# Patient Record
Sex: Female | Born: 1955 | Race: Black or African American | Hispanic: No | Marital: Single | Smoking: Never smoker
Health system: Southern US, Community
[De-identification: ages and names within clinical notes are randomized; demographics above are authoritative.]

## PROBLEM LIST (undated history)

## (undated) DIAGNOSIS — M25562 Pain in left knee: Secondary | ICD-10-CM

## (undated) DIAGNOSIS — E119 Type 2 diabetes mellitus without complications: Secondary | ICD-10-CM

## (undated) DIAGNOSIS — I10 Essential (primary) hypertension: Secondary | ICD-10-CM

## (undated) HISTORY — DX: Type 2 diabetes mellitus without complications: E11.9

## (undated) HISTORY — PX: ABDOMINAL HYSTERECTOMY: SHX81

## (undated) HISTORY — DX: Morbid (severe) obesity due to excess calories: E66.01

## (undated) HISTORY — PX: ACHILLES TENDON SURGERY: SHX542

## (undated) HISTORY — DX: Pain in left knee: M25.562

---

## 2003-11-25 ENCOUNTER — Other Ambulatory Visit: Payer: Self-pay

## 2004-10-15 ENCOUNTER — Ambulatory Visit: Payer: Self-pay | Admitting: Internal Medicine

## 2006-03-21 ENCOUNTER — Ambulatory Visit: Payer: Self-pay | Admitting: Family Medicine

## 2007-01-06 ENCOUNTER — Ambulatory Visit: Payer: Self-pay | Admitting: Gastroenterology

## 2007-07-16 ENCOUNTER — Emergency Department: Payer: Self-pay | Admitting: Emergency Medicine

## 2008-03-21 ENCOUNTER — Ambulatory Visit: Payer: Self-pay

## 2009-11-14 ENCOUNTER — Emergency Department: Payer: Self-pay | Admitting: Emergency Medicine

## 2011-03-03 ENCOUNTER — Emergency Department: Payer: Self-pay | Admitting: Emergency Medicine

## 2011-09-26 ENCOUNTER — Ambulatory Visit: Payer: Self-pay

## 2011-10-20 ENCOUNTER — Emergency Department: Payer: Self-pay | Admitting: Unknown Physician Specialty

## 2011-11-12 ENCOUNTER — Ambulatory Visit: Payer: Self-pay | Admitting: Sports Medicine

## 2011-11-13 ENCOUNTER — Ambulatory Visit: Payer: Self-pay | Admitting: Sports Medicine

## 2012-02-25 ENCOUNTER — Ambulatory Visit: Payer: Self-pay

## 2013-06-03 ENCOUNTER — Emergency Department: Payer: Self-pay | Admitting: Emergency Medicine

## 2013-06-03 LAB — COMPREHENSIVE METABOLIC PANEL
ALBUMIN: 3.5 g/dL (ref 3.4–5.0)
ALK PHOS: 78 U/L
ALT: 25 U/L (ref 12–78)
Anion Gap: 6 — ABNORMAL LOW (ref 7–16)
BILIRUBIN TOTAL: 0.3 mg/dL (ref 0.2–1.0)
BUN: 14 mg/dL (ref 7–18)
CHLORIDE: 101 mmol/L (ref 98–107)
CREATININE: 0.77 mg/dL (ref 0.60–1.30)
Calcium, Total: 9.4 mg/dL (ref 8.5–10.1)
Co2: 30 mmol/L (ref 21–32)
EGFR (African American): >60
Glucose: 103 mg/dL — ABNORMAL HIGH (ref 65–99)
OSMOLALITY: 275 (ref 275–301)
POTASSIUM: 3.1 mmol/L — AB (ref 3.5–5.1)
SGOT(AST): 21 U/L (ref 15–37)
Sodium: 137 mmol/L (ref 136–145)
Total Protein: 8.2 g/dL (ref 6.4–8.2)

## 2013-06-03 LAB — CBC
HCT: 35.3 % (ref 35.0–47.0)
HGB: 11.8 g/dL — AB (ref 12.0–16.0)
MCH: 27.1 pg (ref 26.0–34.0)
MCHC: 33.5 g/dL (ref 32.0–36.0)
MCV: 81 fL (ref 80–100)
PLATELETS: 435 10*3/uL (ref 150–440)
RBC: 4.36 10*6/uL (ref 3.80–5.20)
RDW: 13.9 % (ref 11.5–14.5)
WBC: 7.9 10*3/uL (ref 3.6–11.0)

## 2013-06-03 LAB — TROPONIN I

## 2014-03-07 ENCOUNTER — Ambulatory Visit: Payer: Self-pay

## 2014-04-27 ENCOUNTER — Ambulatory Visit: Payer: Self-pay | Admitting: Nurse Practitioner

## 2014-05-17 DIAGNOSIS — M25562 Pain in left knee: Secondary | ICD-10-CM

## 2014-05-17 HISTORY — DX: Pain in left knee: M25.562

## 2014-07-21 ENCOUNTER — Emergency Department
Admit: 2014-07-21 | Disposition: A | Discharge status: Home / Self Care | Payer: Self-pay | Admitting: Emergency Medicine

## 2014-08-02 LAB — CBC AND DIFFERENTIAL
NEUTROS ABS: 3 /uL
WBC: 6.1 10^3/mL

## 2014-08-02 LAB — TSH: TSH: 0.9 u[IU]/mL (ref 0.41–5.90)

## 2014-11-15 ENCOUNTER — Other Ambulatory Visit: Payer: Self-pay

## 2014-11-21 ENCOUNTER — Inpatient Hospital Stay
Admission: EM | Admit: 2014-11-21 | Discharge: 2014-11-24 | DRG: 354 | Disposition: A | Discharge status: Home / Self Care | Payer: Self-pay | Attending: Surgery | Admitting: Surgery

## 2014-11-21 ENCOUNTER — Emergency Department: Payer: Self-pay

## 2014-11-21 ENCOUNTER — Encounter: Payer: Self-pay | Admitting: *Deleted

## 2014-11-21 DIAGNOSIS — R1012 Left upper quadrant pain: Secondary | ICD-10-CM

## 2014-11-21 DIAGNOSIS — E669 Obesity, unspecified: Secondary | ICD-10-CM | POA: Diagnosis present

## 2014-11-21 DIAGNOSIS — K439 Ventral hernia without obstruction or gangrene: Secondary | ICD-10-CM

## 2014-11-21 DIAGNOSIS — K432 Incisional hernia without obstruction or gangrene: Secondary | ICD-10-CM | POA: Diagnosis present

## 2014-11-21 DIAGNOSIS — Z6841 Body Mass Index (BMI) 40.0 and over, adult: Secondary | ICD-10-CM

## 2014-11-21 DIAGNOSIS — I10 Essential (primary) hypertension: Secondary | ICD-10-CM | POA: Diagnosis present

## 2014-11-21 DIAGNOSIS — K430 Incisional hernia with obstruction, without gangrene: Principal | ICD-10-CM | POA: Diagnosis present

## 2014-11-21 HISTORY — DX: Essential (primary) hypertension: I10

## 2014-11-21 HISTORY — PX: HERNIA REPAIR: SHX51

## 2014-11-21 LAB — CBC
HEMATOCRIT: 38.7 % (ref 35.0–47.0)
Hemoglobin: 12.6 g/dL (ref 12.0–16.0)
MCH: 26.5 pg (ref 26.0–34.0)
MCHC: 32.7 g/dL (ref 32.0–36.0)
MCV: 81.1 fL (ref 80.0–100.0)
Platelets: 456 10*3/uL — ABNORMAL HIGH (ref 150–440)
RBC: 4.77 MIL/uL (ref 3.80–5.20)
RDW: 15 % — AB (ref 11.5–14.5)
WBC: 12.1 10*3/uL — AB (ref 3.6–11.0)

## 2014-11-21 LAB — COMPREHENSIVE METABOLIC PANEL
ALBUMIN: 4.4 g/dL (ref 3.5–5.0)
ALT: 18 U/L (ref 14–54)
AST: 27 U/L (ref 15–41)
Alkaline Phosphatase: 68 U/L (ref 38–126)
Anion gap: 13 (ref 5–15)
BUN: 15 mg/dL (ref 6–20)
CHLORIDE: 96 mmol/L — AB (ref 101–111)
CO2: 24 mmol/L (ref 22–32)
CREATININE: 0.86 mg/dL (ref 0.44–1.00)
Calcium: 9.6 mg/dL (ref 8.9–10.3)
GFR calc Af Amer: >60 mL/min (ref >60)
Glucose, Bld: 123 mg/dL — ABNORMAL HIGH (ref 65–99)
Potassium: 3.1 mmol/L — ABNORMAL LOW (ref 3.5–5.1)
Sodium: 133 mmol/L — ABNORMAL LOW (ref 135–145)
TOTAL PROTEIN: 8.6 g/dL — AB (ref 6.5–8.1)
Total Bilirubin: 0.1 mg/dL — ABNORMAL LOW (ref 0.3–1.2)

## 2014-11-21 LAB — URINALYSIS COMPLETE WITH MICROSCOPIC (ARMC ONLY)
BACTERIA UA: NONE SEEN
Bilirubin Urine: NEGATIVE
Glucose, UA: NEGATIVE mg/dL
HGB URINE DIPSTICK: NEGATIVE
Ketones, ur: NEGATIVE mg/dL
NITRITE: NEGATIVE
PH: 7 (ref 5.0–8.0)
Protein, ur: NEGATIVE mg/dL
RBC / HPF: NONE SEEN RBC/hpf (ref 0–5)
Specific Gravity, Urine: 1.013 (ref 1.005–1.030)

## 2014-11-21 MED ORDER — IOHEXOL 300 MG/ML  SOLN
100.0000 mL | Freq: Once | INTRAMUSCULAR | Status: AC | PRN
  Filled 2014-11-21: qty 100

## 2014-11-21 MED ORDER — METFORMIN HCL 500 MG PO TABS: 500.0000 mg | ORAL_TABLET | Freq: Two times a day (BID) | ORAL | Status: DC

## 2014-11-21 MED ORDER — ONDANSETRON HCL 4 MG PO TABS: 4.0000 mg | ORAL_TABLET | Freq: Four times a day (QID) | ORAL | Status: DC | PRN

## 2014-11-21 MED ORDER — AMLODIPINE BESYLATE 5 MG PO TABS
10.0000 mg | ORAL_TABLET | Freq: Every day | ORAL | Status: DC
  Administered 2014-11-22 – 2014-11-24 (×3): 10 mg via ORAL
  Filled 2014-11-21 (×3): qty 2

## 2014-11-21 MED ORDER — HEPARIN SODIUM (PORCINE) 5000 UNIT/ML IJ SOLN
5000.0000 [IU] | Freq: Three times a day (TID) | INTRAMUSCULAR | Status: DC
  Administered 2014-11-21 – 2014-11-24 (×8): 5000 [IU] via SUBCUTANEOUS
  Filled 2014-11-21 (×8): qty 1

## 2014-11-21 MED ORDER — ASPIRIN EC 325 MG PO TBEC
325.0000 mg | DELAYED_RELEASE_TABLET | Freq: Every day | ORAL | Status: DC
Start: 2014-11-22 — End: 2014-11-24
  Administered 2014-11-22 – 2014-11-24 (×3): 325 mg via ORAL
  Filled 2014-11-21 (×3): qty 1

## 2014-11-21 MED ORDER — ONDANSETRON HCL 4 MG/2ML IJ SOLN
4.0000 mg | Freq: Four times a day (QID) | INTRAMUSCULAR | Status: DC | PRN
  Administered 2014-11-23 (×2): 4 mg via INTRAVENOUS
  Filled 2014-11-21 (×2): qty 2

## 2014-11-21 MED ORDER — DEXTROSE-NACL 5-0.9 % IV SOLN
INTRAVENOUS | Status: DC
  Administered 2014-11-21 – 2014-11-22 (×2): via INTRAVENOUS
  Administered 2014-11-22 (×2): 1 mL via INTRAVENOUS
  Administered 2014-11-23 – 2014-11-24 (×4): via INTRAVENOUS

## 2014-11-21 MED ORDER — SODIUM CHLORIDE 0.9 % IV SOLN
Freq: Once | INTRAVENOUS | Status: AC
  Administered 2014-11-21: 1000 mL via INTRAVENOUS

## 2014-11-21 MED ORDER — MORPHINE SULFATE 4 MG/ML IJ SOLN
4.0000 mg | Freq: Once | INTRAMUSCULAR | Status: AC
  Administered 2014-11-21: 4 mg via INTRAVENOUS
  Filled 2014-11-21: qty 1

## 2014-11-21 MED ORDER — LISINOPRIL 10 MG PO TABS
10.0000 mg | ORAL_TABLET | Freq: Every day | ORAL | Status: DC
  Administered 2014-11-22 – 2014-11-24 (×3): 10 mg via ORAL
  Filled 2014-11-21 (×3): qty 1

## 2014-11-21 MED ORDER — IOHEXOL 240 MG/ML SOLN
25.0000 mL | Freq: Once | INTRAMUSCULAR | Status: AC | PRN
  Administered 2014-11-21: 25 mL via ORAL
  Filled 2014-11-21: qty 25

## 2014-11-21 MED ORDER — HEPARIN SODIUM (PORCINE) 5000 UNIT/ML IJ SOLN
INTRAMUSCULAR | Status: AC
  Administered 2014-11-21: 5000 [IU] via SUBCUTANEOUS
  Filled 2014-11-21: qty 1

## 2014-11-21 MED ORDER — ONDANSETRON HCL 4 MG/2ML IJ SOLN
4.0000 mg | Freq: Once | INTRAMUSCULAR | Status: AC
  Administered 2014-11-21: 4 mg via INTRAVENOUS
  Filled 2014-11-21: qty 2

## 2014-11-21 MED ORDER — METFORMIN HCL 500 MG PO TABS
ORAL_TABLET | ORAL | Status: AC
  Administered 2014-11-21: 500 mg via ORAL
  Filled 2014-11-21: qty 1

## 2014-11-21 MED ORDER — METFORMIN HCL 500 MG PO TABS
500.0000 mg | ORAL_TABLET | Freq: Two times a day (BID) | ORAL | Status: DC
  Administered 2014-11-21: 500 mg via ORAL

## 2014-11-21 MED ORDER — CLONIDINE HCL 0.1 MG PO TABS
0.3000 mg | ORAL_TABLET | Freq: Two times a day (BID) | ORAL | Status: DC
  Administered 2014-11-21 – 2014-11-24 (×6): 0.3 mg via ORAL
  Filled 2014-11-21 (×5): qty 3

## 2014-11-21 MED ORDER — CLONIDINE HCL 0.1 MG PO TABS
ORAL_TABLET | ORAL | Status: AC
  Administered 2014-11-21: 0.3 mg via ORAL
  Filled 2014-11-21: qty 3

## 2014-11-21 MED ORDER — MORPHINE SULFATE 2 MG/ML IJ SOLN
2.0000 mg | INTRAMUSCULAR | Status: DC | PRN
  Administered 2014-11-22 – 2014-11-23 (×5): 2 mg via INTRAVENOUS
  Filled 2014-11-21 (×5): qty 1

## 2014-11-21 NOTE — ED Notes (Signed)
Pt reports left lower abdominal pain that started yesterday. Denies n/v/d, fevers or urinary symptoms.

## 2014-11-21 NOTE — ED Notes (Signed)
Pt resting in bed at this time. No acute distress noted. Lights dimmed and tv on.

## 2014-11-21 NOTE — H&P (Signed)
Jillian Pierce is an 59 y.o. female.    Chief Complaint: Abdominal pain  HPI: This patient with abdominal pain that started Saturday morning it was worse yesterday but is better today she's had some nausea but no emesis and is passing gas. She's never had an episode like this before. She denies weight loss fevers or chills.  Past Medical History  Diagnosis Date  . Hypertension     History reviewed. No pertinent past surgical history.  No family history on file. Social History:  reports that she has never smoked. She does not have any smokeless tobacco history on file. She reports that she does not drink alcohol or use illicit drugs.  Allergies: No Known Allergies   (Not in a hospital admission)   Review of Systems  Constitutional: Negative for fever and chills.  HENT: Negative.   Eyes: Negative.   Respiratory: Negative for cough, hemoptysis and shortness of breath.   Cardiovascular: Negative for chest pain, palpitations and leg swelling.  Gastrointestinal: Positive for nausea and abdominal pain. Negative for heartburn, vomiting, diarrhea, constipation, blood in stool and melena.  Genitourinary: Negative.   Musculoskeletal: Negative.   Skin: Negative.   Neurological: Negative.   Endo/Heme/Allergies: Negative.   Psychiatric/Behavioral: Negative.      Physical Exam:  BP 151/98 mmHg  Pulse 75  Temp(Src) 97.4 F (36.3 C)  Resp 18  Ht 5' 2" (1.575 m)  Wt 250 lb (113.399 kg)  BMI 45.71 kg/m2  SpO2 96%  Physical Exam  Constitutional: She is oriented to person, place, and time and well-developed, well-nourished, and in no distress.  Obese There is uncomfortable.  HENT:  Head: Normocephalic and atraumatic.  Eyes: No scleral icterus.  Neck: Normal range of motion. Neck supple.  Cardiovascular: Normal rate, regular rhythm and normal heart sounds.   Pulmonary/Chest: Effort normal and breath sounds normal. No respiratory distress. She has no wheezes. She has no rales.   Abdominal: Soft. She exhibits mass. She exhibits no distension. There is tenderness. There is no rebound and no guarding.  Partially reducible but incarcerated ventral hernia cephalad to the umbilicus and to the left of midline slightly. She also has long midline scar from the epigastrium to the umbilicus and below. No other hernias noted. Hernia itself is partially reducible and tender. No peritoneal signs.  Musculoskeletal: Normal range of motion.  Lymphadenopathy:    She has no cervical adenopathy.  Neurological: She is alert and oriented to person, place, and time.  Skin: Skin is warm and dry.  Psychiatric: Mood, affect and judgment normal.        Results for orders placed or performed during the hospital encounter of 11/21/14 (from the past 48 hour(s))  Comprehensive metabolic panel     Status: Abnormal   Collection Time: 11/21/14  6:22 PM  Result Value Ref Range   Sodium 133 (L) 135 - 145 mmol/L   Potassium 3.1 (L) 3.5 - 5.1 mmol/L   Chloride 96 (L) 101 - 111 mmol/L   CO2 24 22 - 32 mmol/L   Glucose, Bld 123 (H) 65 - 99 mg/dL   BUN 15 6 - 20 mg/dL   Creatinine, Ser 0.86 0.44 - 1.00 mg/dL   Calcium 9.6 8.9 - 10.3 mg/dL   Total Protein 8.6 (H) 6.5 - 8.1 g/dL   Albumin 4.4 3.5 - 5.0 g/dL   AST 27 15 - 41 U/L   ALT 18 14 - 54 U/L   Alkaline Phosphatase 68 38 - 126 U/L  Total Bilirubin 0.1 (L) 0.3 - 1.2 mg/dL   GFR calc non Af Amer >60 >60 mL/min   GFR calc Af Amer >60 >60 mL/min    Comment: (NOTE) The eGFR has been calculated using the CKD EPI equation. This calculation has not been validated in all clinical situations. eGFR's persistently <60 mL/min signify possible Chronic Kidney Disease.    Anion gap 13 5 - 15  CBC     Status: Abnormal   Collection Time: 11/21/14  6:22 PM  Result Value Ref Range   WBC 12.1 (H) 3.6 - 11.0 K/uL   RBC 4.77 3.80 - 5.20 MIL/uL   Hemoglobin 12.6 12.0 - 16.0 g/dL   HCT 38.7 35.0 - 47.0 %   MCV 81.1 80.0 - 100.0 fL   MCH 26.5 26.0 -  34.0 pg   MCHC 32.7 32.0 - 36.0 g/dL   RDW 15.0 (H) 11.5 - 14.5 %   Platelets 456 (H) 150 - 440 K/uL  Urinalysis complete, with microscopic ([facility] only)     Status: Abnormal   Collection Time: 11/21/14  6:22 PM  Result Value Ref Range   Color, Urine STRAW (A) YELLOW   APPearance CLEAR (A) CLEAR   Glucose, UA NEGATIVE NEGATIVE mg/dL   Bilirubin Urine NEGATIVE NEGATIVE   Ketones, ur NEGATIVE NEGATIVE mg/dL   Specific Gravity, Urine 1.013 1.005 - 1.030   Hgb urine dipstick NEGATIVE NEGATIVE   pH 7.0 5.0 - 8.0   Protein, ur NEGATIVE NEGATIVE mg/dL   Nitrite NEGATIVE NEGATIVE   Leukocytes, UA TRACE (A) NEGATIVE   RBC / HPF NONE SEEN 0 - 5 RBC/hpf   WBC, UA 0-5 0 - 5 WBC/hpf   Bacteria, UA NONE SEEN NONE SEEN   Squamous Epithelial / LPF 0-5 (A) NONE SEEN   Mucous PRESENT    Ct Abdomen Pelvis W Contrast  11/21/2014   CLINICAL DATA:  Left lower abdominal pain since yesterday  EXAM: CT ABDOMEN AND PELVIS WITH CONTRAST  TECHNIQUE: Multidetector CT imaging of the abdomen and pelvis was performed using the standard protocol following bolus administration of intravenous contrast.  CONTRAST:  100 mL Omnipaque 300  COMPARISON:  None.  FINDINGS: Lower chest:  Visualized portions of the lung bases clear.  Hepatobiliary: Status post cholecystectomy.  Pancreas: Fatty infiltration with no acute findings  Spleen: Negative  Adrenals/Urinary Tract: 3 mm nonobstructing stone lower pole left kidney. 5 cm lower pole left kidney.  Stomach/Bowel: There is a periumbilical hernia in the midline containing transverse colon. Mild inflammatory change is seen within the hernia sac, which measures 10 cm. The entering and exiting loops of bowel are narrowed. There is a nonobstructive bowel gas pattern with no abnormally dilated loops of small or large bowel. However it is noted that large bowel is relatively decompressed distal to the hernia. There is diverticulosis of the sigmoid colon without evidence of diverticulitis.   Vascular/Lymphatic: No significant abnormalities  Reproductive: Not identified presumably surgically absent  Other: No free fluid  Musculoskeletal: No acute abnormalities. Significant degenerative changes throughout the thoracolumbar spine.  IMPRESSION: Abdominal wall hernia contains transverse colon with findings suggesting the possibility of early or mild strangulation. Critical Value/emergent results were called by telephone at the time of interpretation on 11/21/2014 at 8:55 pm to Dr. Lenise Arena , who verbally acknowledged these results.   Electronically Signed   By: Skipper Cliche M.D.   On: 11/21/2014 20:55     Assessment/Plan  CT scan is personally reviewed as are the labs.  Patient has had 3 days of abdominal pain which is improved today and her ventral hernias partially reducible there is no obvious sign of incarceration and she is passing gas. No sign of obstruction. With the patient's condition showing signs of incarceration she requires admission to the hospital and will likely require repair. I will institute anti-medics and pain medications as well as hydration and discussed with Dr. Marina Gravel for timing of surgery. This is discussed with she and Dr. Jimmye Norman in the emergency room.  Florene Glen, MD, FACS

## 2014-11-21 NOTE — ED Provider Notes (Signed)
[facility] Emergency Department Provider Note     Time seen: ----------------------------------------- 6:59 PM on 11/21/2014 -----------------------------------------    I have reviewed the triage vital signs and the nursing notes.   HISTORY  Chief Complaint Abdominal Pain    HPI Jillian Pierce is a 59 y.o. female who presents ER for left-sided abdominal pain that started about 3 days ago and worsened yesterday. She denies any fevers chills, chest pain, shortness breath. Denies nausea vomiting diarrhea. States she hasn't moved her bowels in about 24 hours. Has been able to pass a little bit of gas. She had abdominal surgery about 30 years ago and gallbladder, complains of severe left-sided abdominal pain currently.     Past Medical History  Diagnosis Date  . Hypertension     There are no active problems to display for this patient.   History reviewed. No pertinent past surgical history.  Allergies Review of patient's allergies indicates no known allergies.  Social History History  Substance Use Topics  . Smoking status: Never Smoker   . Smokeless tobacco: Not on file  . Alcohol Use: No    Review of Systems Constitutional: Negative for fever. Eyes: Negative for visual changes. ENT: Negative for sore throat. Cardiovascular: Negative for chest pain. Respiratory: Negative for shortness of breath. Gastrointestinal: Positive for abdominal pain Genitourinary: Negative for dysuria. Musculoskeletal: Negative for back pain. Skin: Negative for rash. Neurological: Negative for headaches, focal weakness or numbness.  10-point ROS otherwise negative.  ____________________________________________   PHYSICAL EXAM:  VITAL SIGNS: ED Triage Vitals  Enc Vitals Group     BP 11/21/14 1812 151/98 mmHg     Pulse Rate 11/21/14 1812 75     Resp 11/21/14 1812 18     Temp 11/21/14 1812 97.4 F (36.3 C)     Temp src --      SpO2 11/21/14 1812 96 %      Weight 11/21/14 1812 250 lb (113.399 kg)     Height 11/21/14 1812 5\' 2"  (1.575 m)     Head Cir --      Peak Flow --      Pain Score 11/21/14 1813 10     Pain Loc --      Pain Edu? --      Excl. in [place]? --     Constitutional: Alert and oriented. Obese, mild distress Eyes: Conjunctivae are normal. PERRL. Normal extraocular movements. ENT   Head: Normocephalic and atraumatic.   Nose: No congestion/rhinnorhea.   Mouth/Throat: Mucous membranes are moist.   Neck: No stridor. Cardiovascular: Normal rate, regular rhythm. Normal and symmetric distal pulses are present in all extremities. No murmurs, rubs, or gallops. Respiratory: Normal respiratory effort without tachypnea nor retractions. Breath sounds are clear and equal bilaterally. No wheezes/rales/rhonchi. Gastrointestinal: There is a ventral hernia, tender and not reducible just left of midline at the umbilicus. Musculoskeletal: Nontender with normal range of motion in all extremities. No joint effusions.  No lower extremity tenderness nor edema. Neurologic:  Normal speech and language. No gross focal neurologic deficits are appreciated. Speech is normal. No gait instability. Skin:  Skin is warm, dry and intact. No rash noted. Psychiatric: Mood and affect are normal. Speech and behavior are normal. Patient exhibits appropriate insight and judgment. ____________________________________________  ED COURSE:  Pertinent labs & imaging results that were available during my care of the patient were reviewed by me and considered in my medical decision making (see chart for details). Patient with an incarcerated hernia,  potentially chronic. Will need CT imaging. ____________________________________________    LABS (pertinent positives/negatives)  Labs Reviewed  COMPREHENSIVE METABOLIC PANEL - Abnormal; Notable for the following:    Sodium 133 (*)    Potassium 3.1 (*)    Chloride 96 (*)    Glucose, Bld 123 (*)    Total  Protein 8.6 (*)    Total Bilirubin 0.1 (*)    All other components within normal limits  CBC - Abnormal; Notable for the following:    WBC 12.1 (*)    RDW 15.0 (*)    Platelets 456 (*)    All other components within normal limits  URINALYSIS COMPLETEWITH MICROSCOPIC ([facility] ONLY) - Abnormal; Notable for the following:    Color, Urine STRAW (*)    APPearance CLEAR (*)    Leukocytes, UA TRACE (*)    Squamous Epithelial / LPF 0-5 (*)    All other components within normal limits    RADIOLOGY Images were viewed by me  CT abdomen and pelvis with contrast IMPRESSION: Abdominal wall hernia contains transverse colon with findings suggesting the possibility of early or mild strangulation. Critical Value/emergent results were called by telephone at the time of interpretation on 11/21/2014 at 8:55 pm to Dr. Lenise Arena , who verbally acknowledged these results. ____________________________________________  FINAL ASSESSMENT AND PLAN  Ventral hernia  Plan: Patient with labs and imaging as dictated above. Case discussed with Dr. Burt Knack who will come and evaluate the patient and the ER. She is still tender. Would recommend observation and pain control. She may end up needing surgery to close this hernia.   Earleen Newport, MD   Earleen Newport, MD 11/21/14 [phone]

## 2014-11-22 ENCOUNTER — Ambulatory Visit: Payer: Self-pay

## 2014-11-22 DIAGNOSIS — K439 Ventral hernia without obstruction or gangrene: Secondary | ICD-10-CM

## 2014-11-22 DIAGNOSIS — K432 Incisional hernia without obstruction or gangrene: Secondary | ICD-10-CM

## 2014-11-22 LAB — BASIC METABOLIC PANEL
ANION GAP: 8 (ref 5–15)
BUN: 12 mg/dL (ref 6–20)
CHLORIDE: 106 mmol/L (ref 101–111)
CO2: 26 mmol/L (ref 22–32)
CREATININE: 0.78 mg/dL (ref 0.44–1.00)
Calcium: 8.6 mg/dL — ABNORMAL LOW (ref 8.9–10.3)
GFR calc non Af Amer: >60 mL/min (ref >60)
Glucose, Bld: 130 mg/dL — ABNORMAL HIGH (ref 65–99)
POTASSIUM: 3.1 mmol/L — AB (ref 3.5–5.1)
SODIUM: 140 mmol/L (ref 135–145)

## 2014-11-22 LAB — CBC
HCT: 32.3 % — ABNORMAL LOW (ref 35.0–47.0)
Hemoglobin: 10.4 g/dL — ABNORMAL LOW (ref 12.0–16.0)
MCH: 26.3 pg (ref 26.0–34.0)
MCHC: 32.3 g/dL (ref 32.0–36.0)
MCV: 81.6 fL (ref 80.0–100.0)
Platelets: 384 10*3/uL (ref 150–440)
RBC: 3.97 MIL/uL (ref 3.80–5.20)
RDW: 15 % — AB (ref 11.5–14.5)
WBC: 8.1 10*3/uL (ref 3.6–11.0)

## 2014-11-22 LAB — MRSA PCR SCREENING: MRSA by PCR: NEGATIVE

## 2014-11-22 NOTE — Progress Notes (Signed)
Patient ID: Jillian Pierce, female   DOB: 06/15/1955, 59 y.o.   MRN: [id number] [facility]   PATIENT NAME: Jillian Pierce    MR#:  [id number]  DATE OF BIRTH:  05/28/1955  SUBJECTIVE:   Feels better, no nausea or emesis.  Hungry  REVIEW OF SYSTEMS:   Review of Systems  Constitutional: Negative.   Cardiovascular: Negative for chest pain and palpitations.  Gastrointestinal: Negative for nausea, vomiting and abdominal pain.  All other systems reviewed and are negative.   DRUG ALLERGIES:  No Known Allergies  VITALS:  Blood pressure 122/73, pulse 73, temperature 98.6 F (37 C), temperature source Oral, resp. rate 18, height 5\' 2"  (1.575 m), weight 246 lb 11.2 oz (111.902 kg), SpO2 96 %.  PHYSICAL EXAMINATION:  GENERAL:  59 y.o.-year-old patient lying in the bed with no acute distress.  EYES: Pupils equal, round, reactive to light and accommodation. No scleral icterus. Extraocular muscles intact.  HEENT: Head atraumatic, normocephalic. Oropharynx and nasopharynx clear.  NECK:  Supple, no jugular venous distention. No thyroid enlargement, no tenderness.  LUNGS: Normal breath sounds bilaterally, no wheezing, rales,rhonchi or crepitation. No use of accessory muscles of respiration.  CARDIOVASCULAR: S1, S2 normal. No murmurs, rubs, or gallops.  ABDOMEN: midline scar, tender mass above midline c/w hernia. EXTREMITIES: No pedal edema, cyanosis, or clubbing.  NEUROLOGIC: Cranial nerves II through XII are intact. Muscle strength 5/5 in all extremities. Sensation intact. Gait not checked.  PSYCHIATRIC: The patient is alert and oriented x 3.  SKIN: No obvious rash, lesion, or ulcer.     ASSESSMENT AND PLAN:   Ventral incisional hernia with colon as incarcerated contents. Obesity  Plan repair with mesh Wednesday   Discussed procedure and all questions addressed.  Personally reviewed Ct scan images.

## 2014-11-23 ENCOUNTER — Inpatient Hospital Stay: Payer: Self-pay | Admitting: *Deleted

## 2014-11-23 ENCOUNTER — Encounter
Admission: EM | Disposition: A | Discharge status: Home / Self Care | Payer: Self-pay | Source: Home / Self Care | Attending: Surgery

## 2014-11-23 ENCOUNTER — Inpatient Hospital Stay: Payer: MEDICAID | Admitting: *Deleted

## 2014-11-23 ENCOUNTER — Encounter: Payer: Self-pay | Admitting: Surgery

## 2014-11-23 HISTORY — PX: INSERTION OF MESH: SHX5868

## 2014-11-23 HISTORY — PX: VENTRAL HERNIA REPAIR: SHX424

## 2014-11-23 SURGERY — REPAIR, HERNIA, VENTRAL
Anesthesia: General

## 2014-11-23 MED ORDER — LACTATED RINGERS IV SOLN
INTRAVENOUS | Status: DC | PRN
  Administered 2014-11-23: 11:00:00 via INTRAVENOUS

## 2014-11-23 MED ORDER — FENTANYL CITRATE (PF) 100 MCG/2ML IJ SOLN
INTRAMUSCULAR | Status: DC | PRN
  Administered 2014-11-23: 50 ug via INTRAVENOUS
  Administered 2014-11-23: 100 ug via INTRAVENOUS

## 2014-11-23 MED ORDER — LIDOCAINE HCL (CARDIAC) 20 MG/ML IV SOLN
INTRAVENOUS | Status: DC | PRN
  Administered 2014-11-23: 100 mg via INTRAVENOUS

## 2014-11-23 MED ORDER — PROPOFOL 10 MG/ML IV BOLUS
INTRAVENOUS | Status: DC | PRN
  Administered 2014-11-23: 150 mg via INTRAVENOUS

## 2014-11-23 MED ORDER — PROMETHAZINE HCL 25 MG/ML IJ SOLN: 6.2500 mg | INTRAMUSCULAR | Status: DC | PRN

## 2014-11-23 MED ORDER — KETOROLAC TROMETHAMINE 30 MG/ML IJ SOLN
INTRAMUSCULAR | Status: DC | PRN
  Administered 2014-11-23: 30 mg via INTRAVENOUS

## 2014-11-23 MED ORDER — MIDAZOLAM HCL 2 MG/2ML IJ SOLN
INTRAMUSCULAR | Status: DC | PRN
  Administered 2014-11-23: 2 mg via INTRAVENOUS

## 2014-11-23 MED ORDER — BUPIVACAINE HCL 0.25 % IJ SOLN
INTRAMUSCULAR | Status: DC | PRN
  Administered 2014-11-23: 30 mL

## 2014-11-23 MED ORDER — HYDROMORPHONE HCL 1 MG/ML IJ SOLN: 0.2500 mg | INTRAMUSCULAR | Status: DC | PRN

## 2014-11-23 MED ORDER — ACETAMINOPHEN 10 MG/ML IV SOLN
INTRAVENOUS | Status: DC | PRN
  Administered 2014-11-23: 1000 mg via INTRAVENOUS

## 2014-11-23 MED ORDER — DEXAMETHASONE SODIUM PHOSPHATE 4 MG/ML IJ SOLN
INTRAMUSCULAR | Status: DC | PRN
  Administered 2014-11-23: 10 mg via INTRAVENOUS

## 2014-11-23 MED ORDER — HYDROCODONE-ACETAMINOPHEN 5-325 MG PO TABS
1.0000 | ORAL_TABLET | Freq: Four times a day (QID) | ORAL | Status: DC | PRN
Start: 2014-11-23 — End: 2016-01-11

## 2014-11-23 MED ORDER — SUGAMMADEX SODIUM 500 MG/5ML IV SOLN
INTRAVENOUS | Status: DC | PRN
  Administered 2014-11-23: 200 mg via INTRAVENOUS

## 2014-11-23 MED ORDER — CEFAZOLIN SODIUM-DEXTROSE 2-3 GM-% IV SOLR
INTRAVENOUS | Status: DC | PRN
  Administered 2014-11-23: 2 g via INTRAVENOUS

## 2014-11-23 MED ORDER — HYDROCODONE-ACETAMINOPHEN 5-325 MG PO TABS: 1.0000 | ORAL_TABLET | Freq: Four times a day (QID) | ORAL | Status: DC | PRN

## 2014-11-23 MED ORDER — SUCCINYLCHOLINE CHLORIDE 20 MG/ML IJ SOLN
INTRAMUSCULAR | Status: DC | PRN
  Administered 2014-11-23: 120 mg via INTRAVENOUS

## 2014-11-23 MED ORDER — ROCURONIUM BROMIDE 100 MG/10ML IV SOLN
INTRAVENOUS | Status: DC | PRN
  Administered 2014-11-23: 20 mg via INTRAVENOUS
  Administered 2014-11-23: 10 mg via INTRAVENOUS
  Administered 2014-11-23: 40 mg via INTRAVENOUS

## 2014-11-23 SURGICAL SUPPLY — 38 items
BINDER ABDOMINAL 12 ML 46-62 (SOFTGOODS) ×2 IMPLANT
CANISTER SUCT 1200ML W/VALVE (MISCELLANEOUS) ×2 IMPLANT
CHLORAPREP W/TINT 26ML (MISCELLANEOUS) ×2 IMPLANT
DRAPE INCISE IOBAN 66X45 STRL (DRAPES) ×2 IMPLANT
DRAPE LAPAROTOMY 100X77 ABD (DRAPES) ×2 IMPLANT
DRAPE SHEET LG 3/4 BI-LAMINATE (DRAPES) ×2 IMPLANT
DRAPE UTILITY 15X26 TOWEL STRL (DRAPES) IMPLANT
DRSG OPSITE POSTOP 4X10 (GAUZE/BANDAGES/DRESSINGS) ×2 IMPLANT
ELECT CAUTERY BLADE 6.4 (BLADE) ×2 IMPLANT
GAUZE SPONGE 4X4 12PLY STRL (GAUZE/BANDAGES/DRESSINGS) IMPLANT
GLOVE BIO SURGEON STRL SZ7.5 (GLOVE) ×12 IMPLANT
GOWN STRL REUS W/ TWL LRG LVL3 (GOWN DISPOSABLE) ×2 IMPLANT
GOWN STRL REUS W/ TWL XL LVL3 (GOWN DISPOSABLE) ×1 IMPLANT
GOWN STRL REUS W/TWL LRG LVL3 (GOWN DISPOSABLE) ×2
GOWN STRL REUS W/TWL XL LVL3 (GOWN DISPOSABLE) ×1
KIT RM TURNOVER STRD PROC AR (KITS) ×2 IMPLANT
LABEL OR SOLS (LABEL) ×2 IMPLANT
LIQUID BAND (GAUZE/BANDAGES/DRESSINGS) IMPLANT
MESH VENTRALEX ST 8CM LRG (Mesh General) ×2 IMPLANT
NDL SAFETY 25GX1.5 (NEEDLE) ×2 IMPLANT
NEEDLE FILTER BLUNT 18X 1/2SAF (NEEDLE) ×1
NEEDLE FILTER BLUNT 18X1 1/2 (NEEDLE) ×1 IMPLANT
NS IRRIG 500ML POUR BTL (IV SOLUTION) ×2 IMPLANT
PACK BASIN MINOR ARMC (MISCELLANEOUS) ×2 IMPLANT
PAD GROUND ADULT SPLIT (MISCELLANEOUS) ×2 IMPLANT
SPONGE LAP 18X18 5 PK (GAUZE/BANDAGES/DRESSINGS) ×2 IMPLANT
STAPLER SKIN PROX 35W (STAPLE) ×2 IMPLANT
STRIP CLOSURE SKIN 1/2X4 (GAUZE/BANDAGES/DRESSINGS) IMPLANT
SUT ETHIBOND 0 (SUTURE) ×8 IMPLANT
SUT VIC AB 0 UR5 27 (SUTURE) IMPLANT
SUT VIC AB 1 CTX 27 (SUTURE) ×2 IMPLANT
SUT VIC AB 2-0 BRD 54 (SUTURE) IMPLANT
SUT VIC AB 2-0 CT1 (SUTURE) IMPLANT
SUT VIC AB 2-0 CT2 27 (SUTURE) ×2 IMPLANT
SUT VICRYL+ 3-0 36IN CT-1 (SUTURE) IMPLANT
SWABSTK COMLB BENZOIN TINCTURE (MISCELLANEOUS) IMPLANT
SYR 3ML LL SCALE MARK (SYRINGE) ×2 IMPLANT
SYRINGE 10CC LL (SYRINGE) ×2 IMPLANT

## 2014-11-23 NOTE — Op Note (Signed)
11/21/2014 - 11/23/2014  12:29 PM  PATIENT:  Jillian Pierce  59 y.o. female  PRE-OPERATIVE DIAGNOSIS:  Incarcerated incisional ventral hernia  POST-OPERATIVE DIAGNOSIS:  Same  PROCEDURE:  Procedure(s): HERNIA REPAIR VENTRAL ADULT (N/A) INSERTION OF MESH (N/A)  Repair of incarcerated ventral incisional hernia with mesh.  SURGEON:  Surgeon(s) and Role:    * Sherri Rad, MD - Primary  ASSISTANTS: None  ANESTHESIA: Gen. Endotracheal      SPECIMEN: None    EBL: Minimal  Description of procedure:    With the patient in the supine position general endotracheal anesthesia was induced. Her abdomen was widely prepped and draped with chlor prep followed by Charlie Pitter and a time out was observed.   Line skin incision was fashion through previous scar. Cautery was used to divide subcutaneous niece tissues. The hernia defect was found in the large hernia sac to the left of the midline was dissected. Hernia sac was excised and the fascial edges with electrocautery and discarded. Hernia sac was opened. Omental attachments to the undersurface of the fascia were divided with both sharp dissection electrocautery. The hernia was able to be reduced only after the fascial defect was incised one centimeter medially.  The fascial defect was approximate 5 cm in size in a transverse orientation and 3 cm in a craniocaudad direction. As such an 8 cm round Ventralex coated patch was brought onto the field and inserted into the fascial defect and secured with multiple mattress type sutures of #0 Ethibond. Fascial edges were taken down off the subcutaneous tissues. Tension-free fascial defect closure was approximated utilizing #0 Ethibond suture.  Deep layer the wound was obliterated with #2-0 Vicryls suture. Skin edges closed with skin stapler. Sterile dressing of honeycomb was placed. Patient tolerated procedure well without immediate complication and an abdominal binder was placed.

## 2014-11-23 NOTE — Anesthesia Procedure Notes (Signed)
Procedure Name: Intubation Date/Time: 11/23/2014 11:15 AM Performed by: Nelda Marseille Pre-anesthesia Checklist: Patient identified, Patient being monitored, Timeout performed, Emergency Drugs available and Suction available Patient Re-evaluated:Patient Re-evaluated prior to inductionOxygen Delivery Method: Circle system utilized Preoxygenation: Pre-oxygenation with 100% oxygen Intubation Type: IV induction, Rapid sequence and Cricoid Pressure applied Ventilation: Mask ventilation without difficulty Laryngoscope Size: Mac and 3 Grade View: Grade II Tube type: Oral Tube size: 7.0 mm Number of attempts: 1 Airway Equipment and Method: Stylet Placement Confirmation: ETT inserted through vocal cords under direct vision,  positive ETCO2 and breath sounds checked- equal and bilateral Secured at: 21 cm Tube secured with: Tape Dental Injury: Teeth and Oropharynx as per pre-operative assessment

## 2014-11-23 NOTE — Transfer of Care (Signed)
Immediate Anesthesia Transfer of Care Note  Patient: Jillian Pierce  Procedure(s) Performed: Procedure(s): HERNIA REPAIR VENTRAL ADULT (N/A) INSERTION OF MESH (N/A)  Patient Location: PACU  Anesthesia Type:General  Level of Consciousness: sedated  Airway & Oxygen Therapy: Patient connected to face mask oxygen  Post-op Assessment: Report given to RN and Post -op Vital signs reviewed and stable  Post vital signs: Reviewed and stable  Last Vitals:  Filed Vitals:   11/23/14 0743  BP: 166/81  Pulse: 78  Temp: 37.3 C  Resp: 17    Complications: No apparent anesthesia complications

## 2014-11-23 NOTE — Addendum Note (Signed)
Addendum  created 11/23/14 1339 by Nelda Marseille, CRNA   Modules edited: Anesthesia Medication Administration

## 2014-11-23 NOTE — Progress Notes (Signed)
Patient ID: Jillian Pierce, female   DOB: 1955-12-13, 59 y.o.   MRN: [id number]   Seen postop. She is comfortable.   Explained operative findings. She is currently in an abdominal binder.   Tentative discharge tomorrow.

## 2014-11-23 NOTE — Anesthesia Preprocedure Evaluation (Addendum)
Anesthesia Evaluation  Patient identified by MRN, date of birth, ID band Patient awake    Reviewed: Allergy & Precautions, NPO status , Patient's Chart, lab work & pertinent test results  Airway Mallampati: III  TM Distance: >3 FB Neck ROM: Full    Dental  (+) Missing, Poor Dentition   Pulmonary sleep apnea ,          Cardiovascular hypertension, Pt. on medications Normal cardiovascular exam    Neuro/Psych    GI/Hepatic Morbid obesity, borderline DM, OSA--not using CPAP.   Endo/Other    Renal/GU      Musculoskeletal   Abdominal (+) + obese,   Peds  Hematology   Anesthesia Other Findings   Reproductive/Obstetrics                           Anesthesia Physical Anesthesia Plan  ASA: III  Anesthesia Plan: General   Post-op Pain Management:    Induction: Intravenous  Airway Management Planned: Oral ETT  Additional Equipment:   Intra-op Plan:   Post-operative Plan: Extubation in OR  Informed Consent: I have reviewed the patients History and Physical, chart, labs and discussed the procedure including the risks, benefits and alternatives for the proposed anesthesia with the patient or authorized representative who has indicated his/her understanding and acceptance.     Plan Discussed with: CRNA  Anesthesia Plan Comments:         Anesthesia Quick Evaluation

## 2014-11-23 NOTE — Anesthesia Postprocedure Evaluation (Signed)
  Anesthesia Post-op Note  Patient: Jillian Pierce  Procedure(s) Performed: Procedure(s): HERNIA REPAIR VENTRAL ADULT (N/A) INSERTION OF MESH (N/A)  Anesthesia type:General  Patient location: PACU  Post pain: Pain level controlled  Post assessment: Post-op Vital signs reviewed, Patient's Cardiovascular Status Stable, Respiratory Function Stable, Patent Airway and No signs of Nausea or vomiting  Post vital signs: Reviewed and stable  Last Vitals:  Filed Vitals:   11/23/14 1240  BP: 167/83  Pulse: 67  Temp:   Resp: 10    Level of consciousness: awake, alert  and patient cooperative  Complications: No apparent anesthesia complications

## 2014-11-24 MED ORDER — HYDROCODONE-ACETAMINOPHEN 5-325 MG PO TABS: 1.0000 | ORAL_TABLET | Freq: Four times a day (QID) | ORAL | Status: DC | PRN

## 2014-11-24 NOTE — Discharge Summary (Signed)
Physician Discharge Summary  Patient ID: Jillian Pierce MRN: [id number] DOB/AGE: [birth year] 59 y.o.  Admit date: 11/21/2014 Discharge date: 11/24/2014  Admission Diagnoses: Ventral incisional hernia with incarceration.  Discharge Diagnoses:  Active Problems:   Ventral incisional hernia   Ventral hernia without obstruction or gangrene   Discharged Condition: Stable and repaired  Hospital Course: The patient was admitted with abdominal pain and a partially reduced ventral incisional hernia. CT scan demonstrated findings consistent with portion of transverse colon within the hernia sac. She was rather symptomatic she was taken to the operating room on August 3 for repair with mesh. She tolerated this well. Postoperatively she was maintained in a binder. On postoperative day #1 the patient was feeling well enough with pain controlled tolerating a regular diet and with stable vital signs to be discharged home with follow up next week in the office.  Disposition home with self-care.    Medication List    TAKE these medications        amLODipine 10 MG tablet  Commonly known as:  NORVASC  Take 10 mg by mouth daily.     aspirin EC 325 MG tablet  Take 325 mg by mouth daily.     cloNIDine 0.3 MG tablet  Commonly known as:  CATAPRES  Take 0.3 mg by mouth 2 (two) times daily.     hydrochlorothiazide 25 MG tablet  Commonly known as:  HYDRODIURIL  Take 25 mg by mouth daily.     HYDROcodone-acetaminophen 5-325 MG per tablet  Commonly known as:  NORCO/VICODIN  Take 1 tablet by mouth every 6 (six) hours as needed for moderate pain.     HYDROcodone-acetaminophen 5-325 MG per tablet  Commonly known as:  NORCO  Take 1-2 tablets by mouth every 6 (six) hours as needed for moderate pain.     lisinopril 10 MG tablet  Commonly known as:  PRINIVIL,ZESTRIL  Take 10 mg by mouth daily.     metFORMIN 500 MG tablet  Commonly known as:  GLUCOPHAGE  Take 500 mg by mouth 2 (two) times daily.          SignedSherri Rad 11/24/2014, 4:55 PM

## 2014-11-24 NOTE — Progress Notes (Signed)
MD ordered patient to be discharged.  Discharge instructions were reviewed with the patient and she voiced understanding.  Patient given her prescription.  Patient instructed on calling to make her follow-up appointment.  IV was already removed.  All questions were answered. Patient left via wheelchair escorted by family and nursing.

## 2014-11-24 NOTE — Care Management (Signed)
Spoke with patient for discharge needs. Patient is self payer, lives with adult son, she stated that she has no income at all and relies on son. She is already being seen by  [facility]. Stated that she can afford her current medications. Gave application for Medication Management and patient agrees to fill out prior to discharge. Will fax over prior to discharge. No home DME or O2. Stated that she is independent but does not drive self, son drives to appointments.  No other needs identified. Anticipate discharge within 24 hours.

## 2014-11-24 NOTE — Discharge Instructions (Signed)
DISCHARGE INSTRUCTIONS TO PATIENT  REMINDER:   Carry a list of your medications and allergies with you at all times  Call your pharmacy at least 1 week in advance to refill prescriptions  Do not mix any prescribed pain medicine with alcohol  Do not drive any motor vehicles while taking pain medication.  Take medications with food.  Do not retake a pain medication if you vomit after taking it.  Activity: no lifting more than 15 pounds until instructed by your doctor.   Dressing Care Instruction (if applicable):              Remove operative dressings in 48 hours.  May Shower-  Call office if any questions regarding this activity.  Dry Dressing as needed to operative site.  Abdominal binder when up and ambulating  Follow-up appointments (date to return to physician): Call for appointment with Dr. Sherri Rad, MD at [phone] or [phone]  If need MD on call after hours and on weekends call Hospital operator at [phone] as ask to speak to Surgeon on call for [facility].  Call Surgeon if you have:  Temperature greater than 100.4  Persistent nausea and vomiting  Severe uncontrolled pain  Redness, tenderness, or signs of infection (pain, swelling, redness, odor or green/yellow discharge around the site)  Difficulty breathing, headache or visual disturbances  Hives  Persistent dizziness or light-headedness  Extreme fatigue  Any other questions or concerns you may have after discharge  In an emergency, call 911 or go to an Emergency Department at a nearby hospital  Diet:  Resume your usual diet.  Avoid spicy, greasy or heavy foods.  If you have nausea or vomiting, go back to liquids.  If you cannot keep liquids down, call your doctor.  Avoid alcohol consumption while on prescription pain medications. Good nutrition promotes healing. Increase fiber and fluids.     I understand and acknowledge receipt of the above instructions.                                                                                                                        Patient or Guardian Signature                                                                    Date/Time  Physician's or R.N.'s Signature                                                                  Date/Time  The discharge instructions have been reviewed with the patient and/or Family Member/Parent/Guardian.  Patient and/or Family Member/Parent/Guardian signed and retained a printed copy.

## 2014-11-24 NOTE — Progress Notes (Signed)
Patient ID: Jillian Pierce, female   DOB: Jan 14, 1956, 59 y.o.   MRN: [id number]   Surgery  POD 1  S/P repair of complex ventral hernia with mesh. The patient is doing well. She is tolerating regular diet with adequate pain control.  Filed Vitals:   11/24/14 0049 11/24/14 0806 11/24/14 0938 11/24/14 1550  BP: 117/58 135/71  127/64  Pulse: 65 58 60 66  Temp: 98.5 F (36.9 C) 98.5 F (36.9 C)  98.4 F (36.9 C)  TempSrc: Oral Oral  Oral  Resp: 16 15  16   Height:      Weight:      SpO2: 95% 97%  99%    PE: abdominal binder is in place.  abdominal incision is clean dry and intact. Her abdomen is soft nontender.  Labs:  CBC Latest Ref Rng 11/22/2014 11/21/2014 06/03/2013  WBC 3.6 - 11.0 K/uL 8.1 12.1(H) 7.9  Hemoglobin 12.0 - 16.0 g/dL 10.4(L) 12.6 11.8(L)  Hematocrit 35.0 - 47.0 % 32.3(L) 38.7 35.3  Platelets 150 - 440 K/uL 384 456(H) 435   CMP Latest Ref Rng 11/22/2014 11/21/2014 06/03/2013  Glucose 65 - 99 mg/dL 130(H) 123(H) 103(H)  BUN 6 - 20 mg/dL 12 15 14   Creatinine 0.44 - 1.00 mg/dL 0.78 0.86 0.77  Sodium 135 - 145 mmol/L 140 133(L) 137  Potassium 3.5 - 5.1 mmol/L 3.1(L) 3.1(L) 3.1(L)  Chloride 101 - 111 mmol/L 106 96(L) 101  CO2 22 - 32 mmol/L 26 24 30   Calcium 8.9 - 10.3 mg/dL 8.6(L) 9.6 9.4  Total Protein 6.5 - 8.1 g/dL - 8.6(H) 8.2  Total Bilirubin 0.3 - 1.2 mg/dL - 0.1(L) 0.3  Alkaline Phos 38 - 126 U/L - 68 78  AST 15 - 41 U/L - 27 21  ALT 14 - 54 U/L - 18 25     IMP she is stable postoperative day #1   stable for discharge.  Plan discharge to home follow up next week. Discharge instructions will include wearing the abdominal binder when up and about. Call with any questions or concerns.

## 2014-12-01 ENCOUNTER — Encounter: Payer: Self-pay | Admitting: Surgery

## 2014-12-01 ENCOUNTER — Ambulatory Visit (INDEPENDENT_AMBULATORY_CARE_PROVIDER_SITE_OTHER): Payer: Self-pay | Admitting: Surgery

## 2014-12-01 ENCOUNTER — Ambulatory Visit: Payer: Self-pay

## 2014-12-01 VITALS — BP 162/95 | HR 123 | Temp 98.5°F | Ht 62.0 in | Wt 244.0 lb

## 2014-12-01 DIAGNOSIS — K432 Incisional hernia without obstruction or gangrene: Secondary | ICD-10-CM | POA: Insufficient documentation

## 2014-12-01 NOTE — Progress Notes (Signed)
   Surgery clinic  Post operative day #8.  The patient is without complaints. She is no longer wearing her binder. She has had normal bowel function. No nausea no vomiting minimal pain.  There has been no reported wound drainage.  Physical examination his morbidly obese female. Wound is clean dry and intact every other staple was removed and Steri-Strips were applied. I can appreciate no evidence of a wound seroma or recurrent hernia.  Impression doing well postoperative day #8 status post ventral hernia repair with mesh.  Plan I will follow up with her in 1 week's time for remaining staple removal. Continue weight lifting restriction.

## 2014-12-01 NOTE — Patient Instructions (Signed)
Please call us if you have any questions or concerns. We will see you in a week.

## 2014-12-07 ENCOUNTER — Encounter: Payer: Self-pay | Admitting: Surgery

## 2014-12-07 ENCOUNTER — Ambulatory Visit (INDEPENDENT_AMBULATORY_CARE_PROVIDER_SITE_OTHER): Payer: Self-pay | Admitting: Surgery

## 2014-12-07 VITALS — BP 139/73 | HR 67 | Temp 98.2°F | Ht 62.0 in | Wt 242.0 lb

## 2014-12-07 DIAGNOSIS — Z09 Encounter for follow-up examination after completed treatment for conditions other than malignant neoplasm: Secondary | ICD-10-CM

## 2014-12-07 NOTE — Progress Notes (Signed)
Surgery Progress Note  S: No acute issues O: Blood pressure 139/73, pulse 67, temperature 98.2 F (36.8 C), temperature source Oral, height 5\' 2"  (1.575 m), weight 242 lb (109.77 kg). GEN: NAD/A&Ox3 ABD: soft, min tender, nondistended, wound c/d/i, no obvious recurrent hernia  A/P 59 yo s/p VHR, doing well - staples removed - weight restrictions - f/u prn.

## 2014-12-07 NOTE — Patient Instructions (Addendum)
Please give [place] a call if you have any questions or concerns. Do not drive on pain medications Do not lift greater than 15 lbs for a period of 6 weeks Call or return to ER if you develop fever greater than 101.5, nausea/vomiting, increased pain, redness/drainage from incisions

## 2015-01-24 ENCOUNTER — Ambulatory Visit
Admission: RE | Admit: 2015-01-24 | Discharge: 2015-01-24 | Disposition: A | Discharge status: Home / Self Care | Payer: Disability Insurance | Source: Ambulatory Visit | Attending: General Practice | Admitting: General Practice

## 2015-01-24 ENCOUNTER — Other Ambulatory Visit: Payer: Self-pay | Admitting: General Practice

## 2015-01-24 DIAGNOSIS — M1990 Unspecified osteoarthritis, unspecified site: Secondary | ICD-10-CM

## 2015-01-24 DIAGNOSIS — M25562 Pain in left knee: Secondary | ICD-10-CM | POA: Insufficient documentation

## 2015-02-16 ENCOUNTER — Other Ambulatory Visit: Payer: Self-pay | Admitting: General Practice

## 2015-02-16 ENCOUNTER — Ambulatory Visit
Admission: RE | Admit: 2015-02-16 | Discharge: 2015-02-16 | Disposition: A | Discharge status: Home / Self Care | Payer: Disability Insurance | Source: Ambulatory Visit | Attending: General Practice | Admitting: General Practice

## 2015-02-16 ENCOUNTER — Other Ambulatory Visit
Admission: RE | Admit: 2015-02-16 | Discharge: 2015-02-16 | Disposition: A | Discharge status: Home / Self Care | Payer: Disability Insurance | Source: Ambulatory Visit | Attending: General Practice | Admitting: General Practice

## 2015-02-16 DIAGNOSIS — M1711 Unilateral primary osteoarthritis, right knee: Secondary | ICD-10-CM | POA: Diagnosis not present

## 2015-02-16 DIAGNOSIS — M19011 Primary osteoarthritis, right shoulder: Secondary | ICD-10-CM | POA: Diagnosis not present

## 2015-02-16 DIAGNOSIS — M25461 Effusion, right knee: Secondary | ICD-10-CM | POA: Diagnosis not present

## 2015-02-16 DIAGNOSIS — M25852 Other specified joint disorders, left hip: Secondary | ICD-10-CM | POA: Insufficient documentation

## 2015-02-16 DIAGNOSIS — M19012 Primary osteoarthritis, left shoulder: Secondary | ICD-10-CM | POA: Insufficient documentation

## 2015-02-16 DIAGNOSIS — I10 Essential (primary) hypertension: Secondary | ICD-10-CM | POA: Diagnosis present

## 2015-02-16 DIAGNOSIS — M1990 Unspecified osteoarthritis, unspecified site: Secondary | ICD-10-CM

## 2015-02-16 DIAGNOSIS — M25851 Other specified joint disorders, right hip: Secondary | ICD-10-CM | POA: Diagnosis not present

## 2015-02-16 LAB — URIC ACID: URIC ACID, SERUM: 7.9 mg/dL — AB (ref 2.3–6.6)

## 2015-02-16 LAB — SEDIMENTATION RATE: Sed Rate: 53 mm/hr — ABNORMAL HIGH (ref 0–30)

## 2015-02-17 LAB — MISC LABCORP TEST (SEND OUT): Labcorp test code: 164947

## 2015-03-09 ENCOUNTER — Other Ambulatory Visit: Payer: Self-pay

## 2015-03-09 LAB — LIPID PANEL
Cholesterol: 176 mg/dL (ref 0–200)
HDL: 58 mg/dL (ref 35–70)
LDL CALC: 103 mg/dL
Triglycerides: 77 mg/dL (ref 40–160)

## 2015-03-09 LAB — HEPATIC FUNCTION PANEL
ALK PHOS: 74 U/L (ref 25–125)
ALT: 12 U/L (ref 7–35)
AST: 23 U/L (ref 13–35)
BILIRUBIN DIRECT: 0.06 mg/dL (ref 0.01–0.4)
BILIRUBIN, TOTAL: 0.2 mg/dL

## 2015-03-09 LAB — HEMOGLOBIN A1C: Hemoglobin A1C: 6.3

## 2015-03-09 LAB — BASIC METABOLIC PANEL
BUN: 15 mg/dL (ref 4–21)
CREATININE: 0.8 mg/dL (ref 0.5–1.1)
GLUCOSE: 94 mg/dL
Potassium: 3.8 mmol/L (ref 3.4–5.3)
Sodium: 144 mmol/L (ref 137–147)

## 2015-03-23 ENCOUNTER — Ambulatory Visit: Payer: Self-pay

## 2015-03-23 DIAGNOSIS — I10 Essential (primary) hypertension: Secondary | ICD-10-CM | POA: Insufficient documentation

## 2015-03-23 DIAGNOSIS — E1159 Type 2 diabetes mellitus with other circulatory complications: Secondary | ICD-10-CM | POA: Insufficient documentation

## 2015-03-23 DIAGNOSIS — I152 Hypertension secondary to endocrine disorders: Secondary | ICD-10-CM | POA: Insufficient documentation

## 2015-03-23 DIAGNOSIS — E1140 Type 2 diabetes mellitus with diabetic neuropathy, unspecified: Secondary | ICD-10-CM | POA: Insufficient documentation

## 2015-04-27 ENCOUNTER — Ambulatory Visit: Payer: Self-pay | Admitting: Ophthalmology

## 2015-04-27 LAB — HM DIABETES EYE EXAM

## 2015-08-07 DIAGNOSIS — M25562 Pain in left knee: Secondary | ICD-10-CM

## 2015-08-07 DIAGNOSIS — E119 Type 2 diabetes mellitus without complications: Secondary | ICD-10-CM

## 2015-08-07 DIAGNOSIS — I10 Essential (primary) hypertension: Secondary | ICD-10-CM

## 2015-10-03 ENCOUNTER — Ambulatory Visit: Payer: Self-pay | Admitting: Family Medicine

## 2015-10-03 ENCOUNTER — Telehealth: Payer: Self-pay

## 2015-10-03 VITALS — BP 130/90 | HR 80 | Ht 62.0 in | Wt 245.0 lb

## 2015-10-03 DIAGNOSIS — M25562 Pain in left knee: Secondary | ICD-10-CM

## 2015-10-03 DIAGNOSIS — E119 Type 2 diabetes mellitus without complications: Secondary | ICD-10-CM

## 2015-10-03 DIAGNOSIS — I152 Hypertension secondary to endocrine disorders: Secondary | ICD-10-CM

## 2015-10-03 DIAGNOSIS — M25532 Pain in left wrist: Secondary | ICD-10-CM

## 2015-10-03 DIAGNOSIS — I10 Essential (primary) hypertension: Secondary | ICD-10-CM

## 2015-10-03 LAB — POCT CBG (FASTING - GLUCOSE)-MANUAL ENTRY: GLUCOSE FASTING, POC: 106 mg/dL — AB (ref 70–99)

## 2015-10-03 MED ORDER — HYDROCHLOROTHIAZIDE 25 MG PO TABS: 25.0000 mg | ORAL_TABLET | Freq: Every day | ORAL | Status: DC

## 2015-10-03 MED ORDER — AMLODIPINE BESYLATE 10 MG PO TABS: 10.0000 mg | ORAL_TABLET | Freq: Every day | ORAL | Status: DC

## 2015-10-03 MED ORDER — NAPROXEN 250 MG PO TABS: 500.0000 mg | ORAL_TABLET | Freq: Two times a day (BID) | ORAL | Status: DC

## 2015-10-03 MED ORDER — METFORMIN HCL 500 MG PO TABS: 500.0000 mg | ORAL_TABLET | Freq: Two times a day (BID) | ORAL | Status: DC

## 2015-10-03 NOTE — Progress Notes (Signed)
   Subjective:    Patient ID: Jillian Pierce, female    DOB: Sep 10, 1955, 60 y.o.   MRN: [id number]  HPI  Patient Active Problem List   Diagnosis Date Noted  . Diabetes ([place]) 03/23/2015  . Hypertension 03/23/2015  . Incisional hernia, without obstruction or gangrene 12/01/2014  . Ventral hernia without obstruction or gangrene   . Ventral incisional hernia 11/21/2014  . Left knee pain 05/17/2014   Patient presents with left wrist pain and follow up to hypertension.    Review of Systems  Tender wrist with history of tendonitis Tender along lateral and medial line of left knee with swelling.  Painful around the knee cap. Blood pressure is good.    Objective:   Physical Exam  Cardiovascular: Normal rate, regular rhythm and normal heart sounds.   Pulmonary/Chest: Effort normal and breath sounds normal.    BP 130/90 mmHg  Pulse 80  Ht 5\' 2"  (1.575 m)  Wt 245 lb (111.131 kg)  BMI 44.80 kg/m2    Medication List       This list is accurate as of: 10/03/15  7:36 PM.  Always use your most recent med list.               amLODipine 10 MG tablet  Commonly known as:  NORVASC  Take 1 tablet (10 mg total) by mouth daily.     aspirin EC 325 MG tablet  Take 325 mg by mouth daily.     cloNIDine 0.3 MG tablet  Commonly known as:  CATAPRES  Take 0.3 mg by mouth 2 (two) times daily. Reported on 10/03/2015     hydrochlorothiazide 25 MG tablet  Commonly known as:  HYDRODIURIL  Take 1 tablet (25 mg total) by mouth daily.     HYDROcodone-acetaminophen 5-325 MG tablet  Commonly known as:  NORCO/VICODIN  Take 1 tablet by mouth every 6 (six) hours as needed for moderate pain.     HYDROcodone-acetaminophen 5-325 MG tablet  Commonly known as:  NORCO  Take 1-2 tablets by mouth every 6 (six) hours as needed for moderate pain.     ibuprofen 800 MG tablet  Commonly known as:  ADVIL,MOTRIN  Take 800 mg by mouth every 8 (eight) hours as needed.     lisinopril 10 MG tablet  Commonly  known as:  PRINIVIL,ZESTRIL  Take 10 mg by mouth daily. Reported on 10/03/2015     metFORMIN 500 MG tablet  Commonly known as:  GLUCOPHAGE  Take 1 tablet (500 mg total) by mouth 2 (two) times daily.             Assessment & Plan:   Labs: Renal, A1c today. Naproxen 500mg  twice day 60    For inflammation and pain in knee and writs.  X-rays of left knee and wrist.  Try to limit NSAID due to gastric/ranal risk. OA HTN Obesity Prediabetes

## 2015-10-05 ENCOUNTER — Other Ambulatory Visit: Payer: Self-pay

## 2015-10-05 DIAGNOSIS — E119 Type 2 diabetes mellitus without complications: Secondary | ICD-10-CM

## 2015-10-06 LAB — RENAL FUNCTION PANEL
Albumin: 4.1 g/dL (ref 3.6–4.8)
BUN/Creatinine Ratio: 23 (ref 12–28)
BUN: 19 mg/dL (ref 8–27)
CALCIUM: 9.7 mg/dL (ref 8.7–10.3)
CO2: 25 mmol/L (ref 18–29)
CREATININE: 0.81 mg/dL (ref 0.57–1.00)
Chloride: 102 mmol/L (ref 96–106)
GFR calc Af Amer: 91 mL/min/{1.73_m2} (ref >59)
GFR, EST NON AFRICAN AMERICAN: 79 mL/min/{1.73_m2} (ref >59)
Glucose: 93 mg/dL (ref 65–99)
PHOSPHORUS: 3.6 mg/dL (ref 2.5–4.5)
Potassium: 3.9 mmol/L (ref 3.5–5.2)
Sodium: 141 mmol/L (ref 134–144)

## 2015-10-06 LAB — HEMOGLOBIN A1C
ESTIMATED AVERAGE GLUCOSE: 134 mg/dL
HEMOGLOBIN A1C: 6.3 % — AB (ref 4.8–5.6)

## 2015-10-12 NOTE — Telephone Encounter (Signed)
Patient requesting refill. 

## 2015-10-16 ENCOUNTER — Ambulatory Visit
Admission: RE | Admit: 2015-10-16 | Discharge: 2015-10-16 | Disposition: A | Discharge status: Home / Self Care | Payer: Self-pay | Source: Ambulatory Visit | Attending: Family Medicine | Admitting: Family Medicine

## 2015-10-16 ENCOUNTER — Other Ambulatory Visit: Payer: Self-pay | Admitting: Family Medicine

## 2015-10-16 ENCOUNTER — Telehealth: Payer: Self-pay

## 2015-10-16 DIAGNOSIS — M1712 Unilateral primary osteoarthritis, left knee: Secondary | ICD-10-CM | POA: Insufficient documentation

## 2015-10-16 DIAGNOSIS — M25532 Pain in left wrist: Secondary | ICD-10-CM

## 2015-10-16 DIAGNOSIS — M25562 Pain in left knee: Secondary | ICD-10-CM

## 2015-10-16 NOTE — Telephone Encounter (Signed)
error 

## 2015-11-06 ENCOUNTER — Other Ambulatory Visit: Payer: Self-pay | Admitting: Urology

## 2015-11-06 DIAGNOSIS — I10 Essential (primary) hypertension: Secondary | ICD-10-CM

## 2015-11-06 MED ORDER — CLONIDINE HCL 0.3 MG PO TABS
0.3000 mg | ORAL_TABLET | Freq: Two times a day (BID) | ORAL | Status: DC
Start: 2015-11-06 — End: 2016-01-11

## 2015-11-06 MED ORDER — LISINOPRIL 10 MG PO TABS: 10.0000 mg | ORAL_TABLET | Freq: Every day | ORAL | Status: DC

## 2016-01-11 ENCOUNTER — Ambulatory Visit: Payer: Self-pay | Admitting: Nurse Practitioner

## 2016-01-11 VITALS — BP 137/83 | HR 64 | Temp 98.4°F | Ht 62.0 in | Wt 235.0 lb

## 2016-01-11 DIAGNOSIS — I10 Essential (primary) hypertension: Secondary | ICD-10-CM

## 2016-01-11 DIAGNOSIS — E782 Mixed hyperlipidemia: Secondary | ICD-10-CM

## 2016-01-11 DIAGNOSIS — E119 Type 2 diabetes mellitus without complications: Secondary | ICD-10-CM

## 2016-01-11 DIAGNOSIS — R5382 Chronic fatigue, unspecified: Secondary | ICD-10-CM

## 2016-01-11 DIAGNOSIS — I152 Hypertension secondary to endocrine disorders: Secondary | ICD-10-CM

## 2016-01-11 DIAGNOSIS — E118 Type 2 diabetes mellitus with unspecified complications: Secondary | ICD-10-CM

## 2016-01-11 LAB — GLUCOSE, POCT (MANUAL RESULT ENTRY): POC GLUCOSE: 112 mg/dL — AB (ref 70–99)

## 2016-01-11 MED ORDER — CLONIDINE HCL 0.3 MG PO TABS: 0.3000 mg | ORAL_TABLET | Freq: Two times a day (BID) | ORAL | 3 refills | Status: DC

## 2016-01-11 MED ORDER — LISINOPRIL 10 MG PO TABS: 10.0000 mg | ORAL_TABLET | Freq: Every day | ORAL | 3 refills | Status: DC

## 2016-01-11 MED ORDER — AMLODIPINE BESYLATE 10 MG PO TABS: 10.0000 mg | ORAL_TABLET | Freq: Every day | ORAL | 3 refills | Status: DC

## 2016-01-11 MED ORDER — METFORMIN HCL 500 MG PO TABS: 500.0000 mg | ORAL_TABLET | Freq: Two times a day (BID) | ORAL | 3 refills | Status: DC

## 2016-01-11 MED ORDER — HYDROCHLOROTHIAZIDE 25 MG PO TABS: 25.0000 mg | ORAL_TABLET | Freq: Every day | ORAL | 3 refills | Status: DC

## 2016-01-11 NOTE — Patient Instructions (Signed)
Will review labs and adjust regimen as indicated  Needs cardiology referral to eval long term chronic murmur, local if possibile, if not consider echocardiogram.

## 2016-01-11 NOTE — Progress Notes (Signed)
Medication is listed twice in the system, so one of the Norco's was deleted.

## 2016-01-11 NOTE — Progress Notes (Signed)
Both hydrocodones were deleted, as we do not provide any controlled substances.    Needs refills on all her meds.    No new concerns, does exercises while sitting or lying down, reports her weight is down by approx 16 pounds, wants to reduce weight to 200 pounds.    Wants to increase her activity level, as her knees tolerate.    Alert, verbally appropriate, in no acuite distress,   No adenopathy, no thyromegaly No carotid bruits  BBrS clear AP RRR, rate in low 60's to high 50's.   Positive for murmur best appreciated at RSB, 2nd ICS, approx III/VI, present long term  Only trace pitting BLE edema  Prediabetes Will continue current regimen HTN will continue current regimen Obesity, has been working with the nutritionist and has lost 16 pounds, she is to continue with their instruction  Will have health maintenance labs drawn asap  Heart murmur, will refer to local cardiologist if possible  Will have pt return in one month to review labs

## 2016-02-08 ENCOUNTER — Other Ambulatory Visit: Payer: Self-pay

## 2016-02-08 DIAGNOSIS — I10 Essential (primary) hypertension: Secondary | ICD-10-CM

## 2016-02-08 DIAGNOSIS — E119 Type 2 diabetes mellitus without complications: Secondary | ICD-10-CM

## 2016-02-09 LAB — COMPREHENSIVE METABOLIC PANEL
A/G RATIO: 1.3 (ref 1.2–2.2)
ALT: 11 IU/L (ref 0–32)
AST: 25 IU/L (ref 0–40)
Albumin: 4 g/dL (ref 3.6–4.8)
Alkaline Phosphatase: 73 IU/L (ref 39–117)
BUN/Creatinine Ratio: 14 (ref 12–28)
BUN: 14 mg/dL (ref 8–27)
CALCIUM: 9.3 mg/dL (ref 8.7–10.3)
CHLORIDE: 100 mmol/L (ref 96–106)
CO2: 21 mmol/L (ref 18–29)
Creatinine, Ser: 0.97 mg/dL (ref 0.57–1.00)
GFR, EST AFRICAN AMERICAN: 73 mL/min/{1.73_m2} (ref >59)
GFR, EST NON AFRICAN AMERICAN: 63 mL/min/{1.73_m2} (ref >59)
GLOBULIN, TOTAL: 3.2 g/dL (ref 1.5–4.5)
Glucose: 117 mg/dL — ABNORMAL HIGH (ref 65–99)
POTASSIUM: 4.7 mmol/L (ref 3.5–5.2)
SODIUM: 142 mmol/L (ref 134–144)
Total Protein: 7.2 g/dL (ref 6.0–8.5)

## 2016-02-09 LAB — CBC WITH DIFFERENTIAL/PLATELET
BASOS: 0 %
Basophils Absolute: 0 10*3/uL (ref 0.0–0.2)
EOS (ABSOLUTE): 0.5 10*3/uL — AB (ref 0.0–0.4)
EOS: 6 %
HEMATOCRIT: 34.7 % (ref 34.0–46.6)
Hemoglobin: 11 g/dL — ABNORMAL LOW (ref 11.1–15.9)
IMMATURE GRANULOCYTES: 0 %
Immature Grans (Abs): 0 10*3/uL (ref 0.0–0.1)
LYMPHS ABS: 3.1 10*3/uL (ref 0.7–3.1)
Lymphs: 37 %
MCH: 25.5 pg — AB (ref 26.6–33.0)
MCHC: 31.7 g/dL (ref 31.5–35.7)
MCV: 80 fL (ref 79–97)
MONOS ABS: 0.6 10*3/uL (ref 0.1–0.9)
Monocytes: 7 %
NEUTROS ABS: 4.1 10*3/uL (ref 1.4–7.0)
NEUTROS PCT: 50 %
PLATELETS: 468 10*3/uL — AB (ref 150–379)
RBC: 4.32 x10E6/uL (ref 3.77–5.28)
RDW: 15.1 % (ref 12.3–15.4)
WBC: 8.4 10*3/uL (ref 3.4–10.8)

## 2016-02-09 LAB — LIPID PANEL
CHOLESTEROL TOTAL: 164 mg/dL (ref 100–199)
Chol/HDL Ratio: 3.3 ratio units (ref 0.0–4.4)
HDL: 50 mg/dL (ref >39)
LDL CALC: 92 mg/dL (ref 0–99)
TRIGLYCERIDES: 110 mg/dL (ref 0–149)
VLDL CHOLESTEROL CAL: 22 mg/dL (ref 5–40)

## 2016-02-09 LAB — HEMOGLOBIN A1C
ESTIMATED AVERAGE GLUCOSE: 126 mg/dL
Hgb A1c MFr Bld: 6 % — ABNORMAL HIGH (ref 4.8–5.6)

## 2016-02-15 ENCOUNTER — Ambulatory Visit: Payer: Self-pay | Admitting: Family Medicine

## 2016-02-15 VITALS — BP 125/75 | HR 75 | Ht 62.0 in | Wt 235.0 lb

## 2016-02-15 DIAGNOSIS — I152 Hypertension secondary to endocrine disorders: Secondary | ICD-10-CM

## 2016-02-15 DIAGNOSIS — M25562 Pain in left knee: Secondary | ICD-10-CM

## 2016-02-15 DIAGNOSIS — E119 Type 2 diabetes mellitus without complications: Secondary | ICD-10-CM

## 2016-02-15 DIAGNOSIS — G8929 Other chronic pain: Secondary | ICD-10-CM

## 2016-02-15 DIAGNOSIS — Z23 Encounter for immunization: Secondary | ICD-10-CM

## 2016-02-15 HISTORY — DX: Morbid (severe) obesity due to excess calories: E66.01

## 2016-02-15 LAB — GLUCOSE, POCT (MANUAL RESULT ENTRY): POC GLUCOSE: 90 mg/dL (ref 70–99)

## 2016-02-15 MED ORDER — HYDROCHLOROTHIAZIDE 25 MG PO TABS: 12.5000 mg | ORAL_TABLET | Freq: Every day | ORAL | 5 refills | Status: DC

## 2016-02-15 NOTE — Addendum Note (Signed)
Addended by: Adah Perl on: 02/15/2016 08:07 PM   Modules accepted: Orders

## 2016-02-15 NOTE — Assessment & Plan Note (Addendum)
On ibuprofen only; warned to not take two NSAIDs; removed naproxen from the med list

## 2016-02-15 NOTE — Patient Instructions (Addendum)
Decrease the HCTZ to just half of a pill once a day That should help the cramps and the urination Check your blood pressure once or twice a week for a few weeks and let us know if your top number is going to 140 or higher  Your goal blood pressure is less than 140 mmHg on top. Try to follow the DASH guidelines (DASH stands for Dietary Approaches to Stop Hypertension) Try to limit the sodium in your diet.  Ideally, consume less than 1.5 grams (less than 1,500mg ) per day. Do not add salt when cooking or at the table.  Check the sodium amount on labels when shopping, and choose items lower in sodium when given a choice. Avoid or limit foods that already contain a lot of sodium. Eat a diet rich in fruits and vegetables and whole grains.  Keep up the great work, because it is paying off  [place] stands for "Dietary Approaches to Stop Hypertension." The DASH eating plan is a healthy eating plan that has been shown to reduce high blood pressure (hypertension). Additional health benefits may include reducing the risk of type 2 diabetes mellitus, heart disease, and stroke. The DASH eating plan may also help with weight loss. WHAT DO I NEED TO KNOW ABOUT THE DASH EATING PLAN? For the DASH eating plan, you will follow these general guidelines:  Choose foods with a percent daily value for sodium of less than 5% (as listed on the food label).  Use salt-free seasonings or herbs instead of table salt or sea salt.  Check with your health care provider or pharmacist before using salt substitutes.  Eat lower-sodium products, often labeled as "lower sodium" or "no salt added."  Eat fresh foods.  Eat more vegetables, fruits, and low-fat dairy products.  Choose whole grains. Look for the word "whole" as the first word in the ingredient list.  Choose fish and skinless chicken or [place] more often than red meat. Limit fish, poultry, and meat to 6 oz (170 g) each day.  Limit sweets, desserts,  sugars, and sugary drinks.  Choose heart-healthy fats.  Limit cheese to 1 oz (28 g) per day.  Eat more home-cooked food and less restaurant, buffet, and fast food.  Limit fried foods.  Cook foods using methods other than frying.  Limit canned vegetables. If you do use them, rinse them well to decrease the sodium.  When eating at a restaurant, ask that your food be prepared with less salt, or no salt if possible. WHAT FOODS CAN I EAT? Seek help from a dietitian for individual calorie needs. Grains Whole grain or whole wheat bread. Brown rice. Whole grain or whole wheat pasta. Quinoa, bulgur, and whole grain cereals. Low-sodium cereals. Corn or whole wheat flour tortillas. Whole grain cornbread. Whole grain crackers. Low-sodium crackers. Vegetables Fresh or frozen vegetables (raw, steamed, roasted, or grilled). Low-sodium or reduced-sodium tomato and vegetable juices. Low-sodium or reduced-sodium tomato sauce and paste. Low-sodium or reduced-sodium canned vegetables.  Fruits All fresh, canned (in natural juice), or frozen fruits. Meat and Other Protein Products Ground beef (85% or leaner), grass-fed beef, or beef trimmed of fat. Skinless chicken or [place]. Ground chicken or [place]. Pork trimmed of fat. All fish and seafood. Eggs. Dried beans, peas, or lentils. Unsalted nuts and seeds. Unsalted canned beans. Dairy Low-fat dairy products, such as skim or 1% milk, 2% or reduced-fat cheeses, low-fat ricotta or cottage cheese, or plain low-fat yogurt. Low-sodium or reduced-sodium cheeses. Fats and Oils Tub  margarines without trans fats. Light or reduced-fat mayonnaise and salad dressings (reduced sodium). Avocado. Safflower, olive, or canola oils. Natural peanut or almond butter. Other Unsalted popcorn and pretzels. The items listed above may not be a complete list of recommended foods or beverages. Contact your dietitian for more options. WHAT FOODS ARE NOT RECOMMENDED? Grains White  bread. White pasta. White rice. Refined cornbread. Bagels and croissants. Crackers that contain trans fat. Vegetables Creamed or fried vegetables. Vegetables in a cheese sauce. Regular canned vegetables. Regular canned tomato sauce and paste. Regular tomato and vegetable juices. Fruits Dried fruits. Canned fruit in light or heavy syrup. Fruit juice. Meat and Other Protein Products Fatty cuts of meat. Ribs, chicken wings, bacon, sausage, bologna, salami, chitterlings, fatback, hot dogs, bratwurst, and packaged luncheon meats. Salted nuts and seeds. Canned beans with salt. Dairy Whole or 2% milk, cream, half-and-half, and cream cheese. Whole-fat or sweetened yogurt. Full-fat cheeses or blue cheese. Nondairy creamers and whipped toppings. Processed cheese, cheese spreads, or cheese curds. Condiments Onion and garlic salt, seasoned salt, table salt, and sea salt. Canned and packaged gravies. Worcestershire sauce. Tartar sauce. Barbecue sauce. Teriyaki sauce. Soy sauce, including reduced sodium. Steak sauce. Fish sauce. Oyster sauce. Cocktail sauce. Horseradish. Ketchup and mustard. Meat flavorings and tenderizers. Bouillon cubes. Hot sauce. Tabasco sauce. Marinades. Taco seasonings. Relishes. Fats and Oils Butter, stick margarine, lard, shortening, ghee, and bacon fat. Coconut, palm kernel, or palm oils. Regular salad dressings. Other Pickles and olives. Salted popcorn and pretzels. The items listed above may not be a complete list of foods and beverages to avoid. Contact your dietitian for more information. WHERE CAN I FIND MORE INFORMATION? National Heart, Lung, and Blood Institute: travelstabloid.com   This information is not intended to replace advice given to you by your health care provider. Make sure you discuss any questions you have with your health care provider.   Document Released: 03/28/2011 Document Revised: 04/29/2014 Document Reviewed:  02/10/2013 Elsevier Interactive Patient Education [employer].

## 2016-02-15 NOTE — Assessment & Plan Note (Signed)
Goal systolic number is less than 140 given her age and diabetes; will decrease HCTZ given her cramps, pt will check BP at local pharmacy and let us know if going up above target; try DASH guidelines; reinforced weight loss

## 2016-02-15 NOTE — Progress Notes (Signed)
BP 125/75   Pulse 75   Ht 5\' 2"  (1.575 m)   Wt 235 lb (106.6 kg)   SpO2 98%   BMI 42.98 kg/m    Subjective:    Patient ID: Jillian Pierce, female    DOB: [birth year], 60 y.o.   MRN: [id number]  HPI: Jillian Pierce is a 60 y.o. female  Chief Complaint  Patient presents with  . Follow-up    follow up on labwork from last week   Patient is here to discuss labs  Type 2 diabetes; last glucose and A1c reviewed; she is here to discuss her lab results; would love to get off of metformin in the future if possible  Anemia, went in to the hospital August 2016 with hernia; CBC showed mild anemia, most recent CBC showed Hgb 11.0  Obesity; has lost 10 pounds intentionally; she weighed 255 pounds before she started program; down to 235 pounds; she is sticking with it; it's hard some days  Little bit of congestion; no fever; no sore throat; would like flu shot if able to received; no previous reactions; been taking them for several years; no hx Guillain Barre; no egg allergy  Arthritis in her knee; ibuprofen works best; naproxen not as good; had both on the list; not taking both naproxen and ibuprofen  Relevant past medical, surgical, family and social history reviewed Past Medical History:  Diagnosis Date  . Diabetes mellitus without complication ([place])   . Hypertension   . Left knee pain 05/17/2014  . Morbid obesity ([place]) 02/15/2016   Past Surgical History:  Procedure Laterality Date  . HERNIA REPAIR  11/2014  . INSERTION OF MESH N/A 11/23/2014   Procedure: INSERTION OF MESH;  Surgeon: Sherri Rad, MD;  Location: [facility] ORS;  Service: General;  Laterality: N/A;  . VENTRAL HERNIA REPAIR N/A 11/23/2014   Procedure: HERNIA REPAIR VENTRAL ADULT;  Surgeon: Sherri Rad, MD;  Location: [facility] ORS;  Service: General;  Laterality: N/A;   Family History  Problem Relation Age of Onset  . Hypertension Mother   . Diabetes Mother   . Kidney disease Mother   . Hypertension Father   . Diabetes Father   .  Heart failure Father   . Heart disease Sister   . Heart disease Brother    Social History  Substance Use Topics  . Smoking status: Never Smoker  . Smokeless tobacco: Never Used  . Alcohol use No   Interim medical history since last visit reviewed. Allergies and medications reviewed  Review of Systems Per HPI unless specifically indicated above     Objective:    BP 125/75   Pulse 75   Ht 5\' 2"  (1.575 m)   Wt 235 lb (106.6 kg)   SpO2 98%   BMI 42.98 kg/m   Wt Readings from Last 3 Encounters:  02/15/16 235 lb (106.6 kg)  01/11/16 235 lb (106.6 kg)  10/03/15 245 lb (111.1 kg)    Physical Exam  Constitutional: She appears well-developed and well-nourished. No distress.  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 No scleral icterus.  Neck: No thyromegaly present.  Cardiovascular: Normal rate and regular rhythm.   Pulmonary/Chest: Effort normal and breath sounds normal.  Abdominal: Soft. She exhibits no distension.  Musculoskeletal: She exhibits no edema.  Neurological: She is alert. She exhibits normal muscle tone.  Skin: Skin is warm and dry. She is not diaphoretic. No pallor.  Psychiatric: She has a normal mood and affect. Her  behavior is normal. Judgment and thought content normal.    Results for orders placed or performed in visit on 02/15/16  POCT Glucose (CBG)  Result Value Ref Range   POC Glucose 90 70 - 99 mg/dl      Assessment & Plan:   Problem List Items Addressed This Visit      Cardiovascular and Mediastinum   Hypertension    Goal systolic number is less than 140 given her age and diabetes; will decrease HCTZ given her cramps, pt will check BP at local pharmacy and let us know if going up above target; try DASH guidelines; reinforced weight loss      Relevant Medications   hydrochlorothiazide (HYDRODIURIL) 25 MG tablet     Endocrine   Diabetes ([facility]) - Primary    A1c showed excellent control at 6; if trend continues with weight loss,  she may be able to stop the metformin at the next visit per provider's discretion; doing diabetes prevention class; foot exam by MD today with no problems; taught to check feet every night; on ACE-I for renal protection; next appt with labs in January      Relevant Orders   POCT Glucose (CBG) (Completed)     Other   Morbid obesity ([place])    Praise given to patient for her hard work in losing weight; as her weight comes down, expect to reduce or remove medicines for blood pressure and diabetes      Left knee pain    On ibuprofen only; warned to not take two NSAIDs; removed naproxen from the med list       Other Visit Diagnoses   None.      Follow up plan: No Follow-up on file. -- keep Jan appt  An after-visit summary was printed and given to the patient at [facility].  Please see the patient instructions which may contain other information and recommendations beyond what is mentioned above in the assessment and plan.  Meds ordered this encounter  Medications  . hydrochlorothiazide (HYDRODIURIL) 25 MG tablet    Sig: Take 0.5 tablets (12.5 mg total) by mouth daily.    Dispense:  15 tablet    Refill:  5    Orders Placed This Encounter  Procedures  . POCT Glucose (CBG)

## 2016-02-15 NOTE — Assessment & Plan Note (Signed)
A1c showed excellent control at 6; if trend continues with weight loss, she may be able to stop the metformin at the next visit per provider's discretion; doing diabetes prevention class; foot exam by MD today with no problems; taught to check feet every night; on ACE-I for renal protection; next appt with labs in January

## 2016-02-15 NOTE — Assessment & Plan Note (Signed)
Praise given to patient for her hard work in losing weight; as her weight comes down, expect to reduce or remove medicines for blood pressure and diabetes

## 2016-04-18 ENCOUNTER — Ambulatory Visit: Payer: Self-pay | Admitting: Nurse Practitioner

## 2016-04-18 ENCOUNTER — Other Ambulatory Visit: Payer: Self-pay

## 2016-04-18 VITALS — BP 124/83 | HR 75 | Temp 98.2°F | Wt 229.0 lb

## 2016-04-18 DIAGNOSIS — E782 Mixed hyperlipidemia: Secondary | ICD-10-CM

## 2016-04-18 DIAGNOSIS — E119 Type 2 diabetes mellitus without complications: Secondary | ICD-10-CM

## 2016-04-18 DIAGNOSIS — E118 Type 2 diabetes mellitus with unspecified complications: Secondary | ICD-10-CM

## 2016-04-18 DIAGNOSIS — R5383 Other fatigue: Secondary | ICD-10-CM

## 2016-04-18 LAB — POCT GLUCOSE (DEVICE FOR HOME USE): GLUCOSE FASTING, POC: 100 mg/dL — AB (ref 70–99)

## 2016-04-18 MED ORDER — METFORMIN HCL 500 MG PO TABS: ORAL_TABLET | ORAL | 3 refills | Status: DC

## 2016-04-18 NOTE — Addendum Note (Signed)
Addended by: Fabiola Backer on: 04/18/2016 08:05 PM   Modules accepted: Orders

## 2016-04-18 NOTE — Progress Notes (Signed)
Here tonight,  Has knee pain, and hands  In a.m.  Fingers may be numb, states has lost strength in her hands  a1c in October at 6   Exam:  Alert verbally appropriate  No thyromegaly, or adenopathy BBrS clear AP RRR, murmur II-III/VI, not a new finding No BLE edema  Because of pain, knee and hand exam deferredPlan:  Knee pain, must consider OA  Hands carpal tunnel and/or dequar vains syndrome.   Will refer to orthopedist for eval and management  Only uses her ibuprofen on weekends, because of sleepiness, will have pt take 1/2 tab tid as needed  May likely need bilateral hand splints  Diabetes, would like to decrease metformin 500 mg bid to one half tablet bid, would like to see a1c drift up a little between 6.5-7, pt given pill cutter

## 2016-04-19 LAB — HEMOGLOBIN A1C
Est. average glucose Bld gHb Est-mCnc: 123 mg/dL
HEMOGLOBIN A1C: 5.9 % — AB (ref 4.8–5.6)

## 2016-04-19 LAB — CBC WITH DIFFERENTIAL/PLATELET
BASOS ABS: 0 10*3/uL (ref 0.0–0.2)
Basos: 0 %
EOS (ABSOLUTE): 0.5 10*3/uL — AB (ref 0.0–0.4)
Eos: 6 %
Hematocrit: 36.5 % (ref 34.0–46.6)
Hemoglobin: 12 g/dL (ref 11.1–15.9)
IMMATURE GRANS (ABS): 0 10*3/uL (ref 0.0–0.1)
Immature Granulocytes: 0 %
LYMPHS ABS: 2.7 10*3/uL (ref 0.7–3.1)
LYMPHS: 34 %
MCH: 26.4 pg — AB (ref 26.6–33.0)
MCHC: 32.9 g/dL (ref 31.5–35.7)
MCV: 80 fL (ref 79–97)
Monocytes Absolute: 0.6 10*3/uL (ref 0.1–0.9)
Monocytes: 8 %
NEUTROS ABS: 4.1 10*3/uL (ref 1.4–7.0)
Neutrophils: 52 %
PLATELETS: 462 10*3/uL — AB (ref 150–379)
RBC: 4.55 x10E6/uL (ref 3.77–5.28)
RDW: 15 % (ref 12.3–15.4)
WBC: 7.9 10*3/uL (ref 3.4–10.8)

## 2016-04-19 LAB — LIPID PANEL
CHOLESTEROL TOTAL: 172 mg/dL (ref 100–199)
Chol/HDL Ratio: 2.6 ratio units (ref 0.0–4.4)
HDL: 65 mg/dL (ref >39)
LDL Calculated: 84 mg/dL (ref 0–99)
TRIGLYCERIDES: 116 mg/dL (ref 0–149)
VLDL Cholesterol Cal: 23 mg/dL (ref 5–40)

## 2016-04-19 LAB — COMPREHENSIVE METABOLIC PANEL
ALK PHOS: 76 IU/L (ref 39–117)
ALT: 8 IU/L (ref 0–32)
AST: 14 IU/L (ref 0–40)
Albumin/Globulin Ratio: 1.4 (ref 1.2–2.2)
Albumin: 4.4 g/dL (ref 3.6–4.8)
BUN/Creatinine Ratio: 22 (ref 12–28)
BUN: 19 mg/dL (ref 8–27)
CHLORIDE: 100 mmol/L (ref 96–106)
CO2: 23 mmol/L (ref 18–29)
Calcium: 9.9 mg/dL (ref 8.7–10.3)
Creatinine, Ser: 0.88 mg/dL (ref 0.57–1.00)
GFR calc Af Amer: 82 mL/min/{1.73_m2} (ref >59)
GFR calc non Af Amer: 71 mL/min/{1.73_m2} (ref >59)
GLUCOSE: 90 mg/dL (ref 65–99)
Globulin, Total: 3.1 g/dL (ref 1.5–4.5)
Potassium: 4.2 mmol/L (ref 3.5–5.2)
Sodium: 141 mmol/L (ref 134–144)
Total Protein: 7.5 g/dL (ref 6.0–8.5)

## 2016-04-19 LAB — TSH: TSH: 1.37 u[IU]/mL (ref 0.450–4.500)

## 2016-04-26 ENCOUNTER — Telehealth: Payer: Self-pay

## 2016-04-26 NOTE — Telephone Encounter (Signed)
-----   Message from Nori Riis, PA-C sent at 04/24/2016  9:46 PM EST ----- Please let the patient know that her labs look good.

## 2016-04-26 NOTE — Telephone Encounter (Signed)
Called pt to give results. No voicemail set up.

## 2016-04-26 NOTE — Telephone Encounter (Signed)
PT called back and I gave the results from the doctor. PT verbalized understanding.

## 2016-05-02 ENCOUNTER — Ambulatory Visit: Payer: Self-pay | Admitting: Ophthalmology

## 2016-05-02 LAB — HM DIABETES EYE EXAM

## 2016-05-08 ENCOUNTER — Encounter: Payer: Self-pay | Admitting: Specialist

## 2016-05-15 ENCOUNTER — Ambulatory Visit: Payer: Self-pay

## 2016-05-15 VITALS — Wt 227.2 lb

## 2016-05-15 DIAGNOSIS — E118 Type 2 diabetes mellitus with unspecified complications: Secondary | ICD-10-CM

## 2016-05-22 ENCOUNTER — Other Ambulatory Visit: Payer: Self-pay | Admitting: Ophthalmology

## 2016-05-22 ENCOUNTER — Encounter: Payer: Self-pay | Admitting: Specialist

## 2016-05-30 ENCOUNTER — Ambulatory Visit: Payer: Self-pay

## 2016-05-31 NOTE — Progress Notes (Unsigned)
Patient came to the MDPP Class at [facility].

## 2016-06-03 NOTE — Patient Instructions (Signed)
Patient to work on eating plan discussed in class.

## 2016-06-19 ENCOUNTER — Encounter: Payer: Self-pay | Admitting: Specialist

## 2016-06-19 ENCOUNTER — Ambulatory Visit: Payer: Self-pay

## 2016-06-24 ENCOUNTER — Other Ambulatory Visit: Payer: Self-pay

## 2016-06-24 DIAGNOSIS — I152 Hypertension secondary to endocrine disorders: Secondary | ICD-10-CM

## 2016-06-24 MED ORDER — AMLODIPINE BESYLATE 10 MG PO TABS: 10.0000 mg | ORAL_TABLET | Freq: Every day | ORAL | 3 refills | Status: DC

## 2016-07-23 ENCOUNTER — Other Ambulatory Visit: Payer: Self-pay

## 2016-07-23 ENCOUNTER — Telehealth: Payer: Self-pay | Admitting: Nurse Practitioner

## 2016-07-23 DIAGNOSIS — I10 Essential (primary) hypertension: Secondary | ICD-10-CM

## 2016-07-23 MED ORDER — LISINOPRIL 10 MG PO TABS: 10.0000 mg | ORAL_TABLET | Freq: Every day | ORAL | 0 refills | Status: DC

## 2016-07-23 MED ORDER — CLONIDINE HCL 0.3 MG PO TABS: 0.3000 mg | ORAL_TABLET | Freq: Two times a day (BID) | ORAL | 0 refills | Status: DC

## 2016-07-23 NOTE — Telephone Encounter (Signed)
Pt called for appt and medication refill. Returned call

## 2016-07-25 ENCOUNTER — Ambulatory Visit: Payer: Self-pay | Admitting: Adult Health Nurse Practitioner

## 2016-07-25 ENCOUNTER — Ambulatory Visit: Payer: Self-pay

## 2016-07-25 VITALS — BP 171/94 | HR 94 | Temp 98.5°F | Wt 237.7 lb

## 2016-07-25 DIAGNOSIS — I10 Essential (primary) hypertension: Secondary | ICD-10-CM

## 2016-07-25 DIAGNOSIS — E119 Type 2 diabetes mellitus without complications: Secondary | ICD-10-CM

## 2016-07-25 LAB — GLUCOSE, POCT (MANUAL RESULT ENTRY): POC Glucose: 112 mg/dl — AB (ref 70–99)

## 2016-07-25 MED ORDER — CLONIDINE HCL 0.3 MG PO TABS: 0.3000 mg | ORAL_TABLET | Freq: Two times a day (BID) | ORAL | 3 refills | Status: DC

## 2016-07-25 MED ORDER — LISINOPRIL 10 MG PO TABS: 10.0000 mg | ORAL_TABLET | Freq: Every day | ORAL | 3 refills | Status: DC

## 2016-07-25 NOTE — Addendum Note (Signed)
Addended by: Meda Klinefelter D on: 07/25/2016 07:36 PM   Modules accepted: Orders

## 2016-07-25 NOTE — Progress Notes (Signed)
  Patient: Jillian Pierce Female    DOB: November 20, 1955   61 y.o.   MRN: [id number] Visit Date: 07/25/2016  Today's Provider: Staci Acosta, NP   Chief Complaint  Patient presents with  . Follow-up    medication refills   Subjective:    HPI   HTN:  Taking medications as directed. Pt states that she was out of medications for a few days.  Just started taking it yesterday.   Last visit BP was 124/83.   DM:  Last A1C: 5.9 in Dec 2017.  Reports diet modifications and weight loss.  Taking 1/2 tablet metformin BID.  Denies any hypoglycemia.  Does not check CBGs at home.       No Known Allergies Previous Medications   AMLODIPINE (NORVASC) 10 MG TABLET    Take 1 tablet (10 mg total) by mouth daily.   ASPIRIN EC 325 MG TABLET    Take 325 mg by mouth daily.   CLONIDINE (CATAPRES) 0.3 MG TABLET    Take 1 tablet (0.3 mg total) by mouth 2 (two) times daily. Reported on 10/03/2015   HYDROCHLOROTHIAZIDE (HYDRODIURIL) 25 MG TABLET    Take 0.5 tablets (12.5 mg total) by mouth daily.   IBUPROFEN (ADVIL,MOTRIN) 800 MG TABLET    Take 800 mg by mouth every 8 (eight) hours as needed.   LISINOPRIL (PRINIVIL,ZESTRIL) 10 MG TABLET    Take 1 tablet (10 mg total) by mouth daily. Reported on 10/03/2015   MELOXICAM (MOBIC) 7.5 MG TABLET    Take 7.5 mg by mouth daily.   METFORMIN (GLUCOPHAGE) 500 MG TABLET    One half tablet by mouth twice a day    Review of Systems  All other systems reviewed and are negative.   Social History  Substance Use Topics  . Smoking status: Never Smoker  . Smokeless tobacco: Never Used  . Alcohol use No   Objective:   BP (!) 171/94   Pulse 94   Temp 98.5 F (36.9 C)   Wt 237 lb 11.2 oz (107.8 kg)   BMI 43.48 kg/m   Physical Exam  Constitutional: She is oriented to person, place, and time. She appears well-developed and well-nourished.  HENT:  Head: Normocephalic and atraumatic.  Eyes: Pupils are equal, round, and reactive to light.  Neck: Normal range of  motion. Neck supple.  Cardiovascular: Normal rate, regular rhythm and normal heart sounds.   Pulmonary/Chest: Effort normal and breath sounds normal.  Neurological: She is alert and oriented to person, place, and time.  Skin: Skin is warm and dry.  Vitals reviewed.       Assessment & Plan:         HTN:  Not controlled today. Last visit was much better.   Goal BP <140/80.  Continue current medication regimen.  Encourage low salt diet and exercise.  FU in 2 weeks for BP check only.   DM:  Controlled.   Encourage diabetic diet and exercise.  Continue current medication regimen- take Metformin 500mg  daily rather than 1/2 tablet BID.        Staci Acosta, NP   [facility]

## 2016-07-26 LAB — HEMOGLOBIN A1C
ESTIMATED AVERAGE GLUCOSE: 123 mg/dL
Hgb A1c MFr Bld: 5.9 % — ABNORMAL HIGH (ref 4.8–5.6)

## 2016-07-26 LAB — COMPREHENSIVE METABOLIC PANEL
A/G RATIO: 1.5 (ref 1.2–2.2)
ALK PHOS: 74 IU/L (ref 39–117)
ALT: 13 IU/L (ref 0–32)
AST: 18 IU/L (ref 0–40)
Albumin: 4.3 g/dL (ref 3.6–4.8)
BUN/Creatinine Ratio: 17 (ref 12–28)
BUN: 16 mg/dL (ref 8–27)
CHLORIDE: 101 mmol/L (ref 96–106)
CO2: 26 mmol/L (ref 18–29)
Calcium: 9.7 mg/dL (ref 8.7–10.3)
Creatinine, Ser: 0.95 mg/dL (ref 0.57–1.00)
GFR calc Af Amer: 74 mL/min/{1.73_m2} (ref >59)
GFR calc non Af Amer: 64 mL/min/{1.73_m2} (ref >59)
GLOBULIN, TOTAL: 2.8 g/dL (ref 1.5–4.5)
Glucose: 111 mg/dL — ABNORMAL HIGH (ref 65–99)
POTASSIUM: 4 mmol/L (ref 3.5–5.2)
SODIUM: 144 mmol/L (ref 134–144)
Total Protein: 7.1 g/dL (ref 6.0–8.5)

## 2016-08-08 ENCOUNTER — Other Ambulatory Visit: Payer: Self-pay

## 2016-10-17 ENCOUNTER — Telehealth: Payer: Self-pay

## 2016-10-17 NOTE — Telephone Encounter (Signed)
Rescheduled appt due to limited providers.

## 2016-10-24 ENCOUNTER — Ambulatory Visit: Payer: Self-pay

## 2016-10-31 ENCOUNTER — Other Ambulatory Visit: Payer: Self-pay

## 2016-10-31 DIAGNOSIS — M25569 Pain in unspecified knee: Secondary | ICD-10-CM

## 2016-10-31 MED ORDER — IBUPROFEN 800 MG PO TABS: 800.0000 mg | ORAL_TABLET | Freq: Three times a day (TID) | ORAL | 2 refills | Status: DC | PRN

## 2016-11-14 ENCOUNTER — Ambulatory Visit: Payer: Self-pay

## 2016-12-02 ENCOUNTER — Other Ambulatory Visit: Payer: Self-pay | Admitting: Internal Medicine

## 2016-12-02 DIAGNOSIS — I152 Hypertension secondary to endocrine disorders: Secondary | ICD-10-CM

## 2016-12-05 ENCOUNTER — Ambulatory Visit: Payer: Self-pay | Admitting: Adult Health Nurse Practitioner

## 2016-12-05 VITALS — BP 130/90 | HR 70 | Temp 97.5°F | Wt 244.2 lb

## 2016-12-05 DIAGNOSIS — I10 Essential (primary) hypertension: Secondary | ICD-10-CM

## 2016-12-05 DIAGNOSIS — E119 Type 2 diabetes mellitus without complications: Secondary | ICD-10-CM

## 2016-12-05 DIAGNOSIS — I152 Hypertension secondary to endocrine disorders: Secondary | ICD-10-CM

## 2016-12-05 LAB — GLUCOSE, POCT (MANUAL RESULT ENTRY): POC GLUCOSE: 104 mg/dL — AB (ref 70–99)

## 2016-12-05 MED ORDER — HYDROCHLOROTHIAZIDE 25 MG PO TABS: 12.5000 mg | ORAL_TABLET | Freq: Every day | ORAL | 5 refills | Status: DC

## 2016-12-05 MED ORDER — CLONIDINE HCL 0.3 MG PO TABS: 0.3000 mg | ORAL_TABLET | Freq: Two times a day (BID) | ORAL | 3 refills | Status: DC

## 2016-12-05 MED ORDER — LISINOPRIL 10 MG PO TABS: 10.0000 mg | ORAL_TABLET | Freq: Every day | ORAL | 3 refills | Status: DC

## 2016-12-05 MED ORDER — AMLODIPINE BESYLATE 10 MG PO TABS: 10.0000 mg | ORAL_TABLET | Freq: Every day | ORAL | 3 refills | Status: DC

## 2016-12-05 NOTE — Progress Notes (Signed)
  Patient: Jillian Pierce Female    DOB: Aug 17, 1955   61 y.o.   MRN: [id number] Visit Date: 12/05/2016  Today's Provider: Staci Acosta, NP   Chief Complaint  Patient presents with  . Follow-up   Subjective:    HPI   Pt states she is taking her medications as directed.  BP much better controlled from previous.   Pt is taking 1/2 tablet of metformin daily.  Last A1C is 5.9.      No Known Allergies Previous Medications   AMLODIPINE (NORVASC) 10 MG TABLET    TAKE 1 TABLET BY MOUTH EVERY DAY   ASPIRIN EC 325 MG TABLET    Take 325 mg by mouth daily.   CLONIDINE (CATAPRES) 0.3 MG TABLET    Take 1 tablet (0.3 mg total) by mouth 2 (two) times daily. Reported on 10/03/2015   HYDROCHLOROTHIAZIDE (HYDRODIURIL) 25 MG TABLET    Take 0.5 tablets (12.5 mg total) by mouth daily.   IBUPROFEN (ADVIL,MOTRIN) 800 MG TABLET    Take 1 tablet (800 mg total) by mouth every 8 (eight) hours as needed.   LISINOPRIL (PRINIVIL,ZESTRIL) 10 MG TABLET    Take 1 tablet (10 mg total) by mouth daily. Reported on 10/03/2015   MELOXICAM (MOBIC) 7.5 MG TABLET    Take 7.5 mg by mouth daily.   METFORMIN (GLUCOPHAGE) 500 MG TABLET    One half tablet by mouth twice a day    Review of Systems  All other systems reviewed and are negative.   Social History  Substance Use Topics  . Smoking status: Never Smoker  . Smokeless tobacco: Never Used  . Alcohol use No   Objective:   BP 130/90   Pulse 70   Temp (!) 97.5 F (36.4 C)   Wt 244 lb 3.2 oz (110.8 kg)   BMI 44.66 kg/m   Physical Exam  Constitutional: She is oriented to person, place, and time. She appears well-developed and well-nourished.  Neck: Normal range of motion. Neck supple.  Cardiovascular: Normal rate, regular rhythm, normal heart sounds and intact distal pulses.   Pulmonary/Chest: Effort normal and breath sounds normal.  Abdominal: Soft. Bowel sounds are normal.  Neurological: She is alert and oriented to person, place, and time.  Skin: Skin  is warm and dry.  Vitals reviewed.       Assessment & Plan:         DM:  Controlled.  Encourage diabetic diet and exercise.  Continue current medication regimen.   HTN:  Controlled.   Goal BP <140/90.  Continue current medication regimen.  Encourage low salt diet and exercise.   Labs ordered.  Staci Acosta, NP   [facility]

## 2016-12-06 LAB — COMPREHENSIVE METABOLIC PANEL
ALBUMIN: 4.4 g/dL (ref 3.6–4.8)
ALK PHOS: 76 IU/L (ref 39–117)
ALT: 17 IU/L (ref 0–32)
AST: 18 IU/L (ref 0–40)
Albumin/Globulin Ratio: 1.4 (ref 1.2–2.2)
BUN/Creatinine Ratio: 24 (ref 12–28)
BUN: 19 mg/dL (ref 8–27)
Bilirubin Total: 0.2 mg/dL (ref 0.0–1.2)
CO2: 18 mmol/L — ABNORMAL LOW (ref 20–29)
Calcium: 10.1 mg/dL (ref 8.7–10.3)
Chloride: 102 mmol/L (ref 96–106)
Creatinine, Ser: 0.78 mg/dL (ref 0.57–1.00)
GFR calc Af Amer: 94 mL/min/{1.73_m2} (ref >59)
GFR calc non Af Amer: 82 mL/min/{1.73_m2} (ref >59)
GLOBULIN, TOTAL: 3.2 g/dL (ref 1.5–4.5)
Glucose: 100 mg/dL — ABNORMAL HIGH (ref 65–99)
POTASSIUM: 4 mmol/L (ref 3.5–5.2)
SODIUM: 142 mmol/L (ref 134–144)
Total Protein: 7.6 g/dL (ref 6.0–8.5)

## 2016-12-06 LAB — HEMOGLOBIN A1C
ESTIMATED AVERAGE GLUCOSE: 131 mg/dL
HEMOGLOBIN A1C: 6.2 % — AB (ref 4.8–5.6)

## 2017-04-08 ENCOUNTER — Other Ambulatory Visit: Payer: Self-pay

## 2017-04-08 DIAGNOSIS — E119 Type 2 diabetes mellitus without complications: Secondary | ICD-10-CM

## 2017-04-08 MED ORDER — METFORMIN HCL 500 MG PO TABS: ORAL_TABLET | ORAL | 3 refills | Status: DC

## 2017-04-10 ENCOUNTER — Ambulatory Visit: Payer: Self-pay | Admitting: Family Medicine

## 2017-04-10 VITALS — BP 138/88 | HR 100 | Temp 98.7°F | Wt 249.4 lb

## 2017-04-10 DIAGNOSIS — I10 Essential (primary) hypertension: Secondary | ICD-10-CM

## 2017-04-10 DIAGNOSIS — M25562 Pain in left knee: Principal | ICD-10-CM

## 2017-04-10 DIAGNOSIS — E119 Type 2 diabetes mellitus without complications: Secondary | ICD-10-CM

## 2017-04-10 DIAGNOSIS — I152 Hypertension secondary to endocrine disorders: Secondary | ICD-10-CM

## 2017-04-10 DIAGNOSIS — G8929 Other chronic pain: Secondary | ICD-10-CM

## 2017-04-10 MED ORDER — CLONIDINE HCL 0.3 MG PO TABS: 0.3000 mg | ORAL_TABLET | Freq: Two times a day (BID) | ORAL | 6 refills | Status: DC

## 2017-04-10 MED ORDER — DICLOFENAC SODIUM 75 MG PO TBEC: 75.0000 mg | DELAYED_RELEASE_TABLET | Freq: Two times a day (BID) | ORAL | 4 refills | Status: DC

## 2017-04-10 MED ORDER — LISINOPRIL 10 MG PO TABS: 10.0000 mg | ORAL_TABLET | Freq: Every day | ORAL | 6 refills | Status: DC

## 2017-04-10 MED ORDER — AMLODIPINE BESYLATE 10 MG PO TABS: 10.0000 mg | ORAL_TABLET | Freq: Every day | ORAL | 6 refills | Status: DC

## 2017-04-10 MED ORDER — METFORMIN HCL 500 MG PO TABS: ORAL_TABLET | ORAL | 6 refills | Status: DC

## 2017-04-10 MED ORDER — HYDROCHLOROTHIAZIDE 25 MG PO TABS: 12.5000 mg | ORAL_TABLET | Freq: Every day | ORAL | 6 refills | Status: DC

## 2017-04-10 NOTE — Addendum Note (Signed)
Addended by: Adah Perl on: 04/10/2017 06:57 PM   Modules accepted: Orders

## 2017-04-10 NOTE — Progress Notes (Signed)
BP 138/88 (BP Location: Right Arm, Cuff Size: Large)   Pulse 100   Temp 98.7 F (37.1 C) (Oral)   Wt 249 lb 6.4 oz (113.1 kg)   BMI 45.62 kg/m    Subjective:    Patient ID: Jillian Pierce, female    DOB: December 24, 1955, 61 y.o.   MRN: [id number]  HPI: Jillian Pierce is a 61 y.o. female  Chief Complaint  Patient presents with  . Knee Pain    left knee pain and follow up   KNEE PAIN Duration: chronic- has known arthritis, has been hurting worse for about 2-3 weeks Involved knee: left Mechanism of injury: unknown Location:anterior diffuse Onset: gradual Severity: severe  Quality:  Aching and dull Frequency: constant Radiation: no Aggravating factors: weight bearing, walking, stairs and prolonged sitting  Alleviating factors: nothing  Status: worse Treatments attempted:meloxicam   Relief with NSAIDs?:  no Weakness with weight bearing or walking: no Sensation of giving way: yes Locking: no Popping: no Bruising: no Swelling: yes Redness: no Paresthesias/decreased sensation: yes Fevers: no  DIABETES Hypoglycemic episodes:no Polydipsia/polyuria: no Visual disturbance: no Chest pain: no Paresthesias: no Glucose Monitoring: no  Accucheck frequency: Not Checking Taking Insulin?: no Blood Pressure Monitoring: not checking   Relevant past medical, surgical, family and social history reviewed and updated as indicated. Interim medical history since our last visit reviewed. Allergies and medications reviewed and updated.  Review of Systems  Constitutional: Negative.   Respiratory: Negative.   Cardiovascular: Negative.   Musculoskeletal: Positive for arthralgias, gait problem and joint swelling. Negative for back pain, myalgias, neck pain and neck stiffness.  Skin: Negative.   Neurological: Negative for dizziness, tremors, seizures, syncope, facial asymmetry, speech difficulty, weakness, light-headedness, numbness and headaches.  Psychiatric/Behavioral: Negative.      Per HPI unless specifically indicated above     Objective:    BP 138/88 (BP Location: Right Arm, Cuff Size: Large)   Pulse 100   Temp 98.7 F (37.1 C) (Oral)   Wt 249 lb 6.4 oz (113.1 kg)   BMI 45.62 kg/m   Wt Readings from Last 3 Encounters:  04/10/17 249 lb 6.4 oz (113.1 kg)  12/05/16 244 lb 3.2 oz (110.8 kg)  07/25/16 237 lb 11.2 oz (107.8 kg)    Physical Exam  Constitutional: She is oriented to person, place, and time. She appears well-developed and well-nourished. No distress.  HENT:  Head: Normocephalic and atraumatic.  Right Ear: Hearing normal.  Left Ear: Hearing normal.  Nose: Nose normal.  Eyes: Conjunctivae and lids are normal. Right eye exhibits no discharge. Left eye exhibits no discharge. No scleral icterus.  Cardiovascular: Normal rate, regular rhythm, normal heart sounds and intact distal pulses. Exam reveals no gallop and no friction rub.  No murmur heard. Pulmonary/Chest: Effort normal and breath sounds normal. No respiratory distress. She has no wheezes. She has no rales. She exhibits no tenderness.  Musculoskeletal: She exhibits edema and tenderness.  Negative anterior and posterior drawer, negative mcmurrays, negative appley's compression and distraction  Neurological: She is alert and oriented to person, place, and time.  Skin: Skin is warm, dry and intact. No rash noted. She is not diaphoretic. No erythema. No pallor.  Psychiatric: She has a normal mood and affect. Her speech is normal and behavior is normal. Judgment and thought content normal. Cognition and memory are normal.  Nursing note and vitals reviewed.   Results for orders placed or performed in visit on 12/05/16  Comp Met (CMET)  Result Value  Ref Range   Glucose 100 (H) 65 - 99 mg/dL   BUN 19 8 - 27 mg/dL   Creatinine, Ser 0.78 0.57 - 1.00 mg/dL   GFR calc non Af Amer 82 >59 mL/min/1.73   GFR calc Af Amer 94 >59 mL/min/1.73   BUN/Creatinine Ratio 24 12 - 28   Sodium 142 134 - 144  mmol/L   Potassium 4.0 3.5 - 5.2 mmol/L   Chloride 102 96 - 106 mmol/L   CO2 18 (L) 20 - 29 mmol/L   Calcium 10.1 8.7 - 10.3 mg/dL   Total Protein 7.6 6.0 - 8.5 g/dL   Albumin 4.4 3.6 - 4.8 g/dL   Globulin, Total 3.2 1.5 - 4.5 g/dL   Albumin/Globulin Ratio 1.4 1.2 - 2.2   Bilirubin Total 0.2 0.0 - 1.2 mg/dL   Alkaline Phosphatase 76 39 - 117 IU/L   AST 18 0 - 40 IU/L   ALT 17 0 - 32 IU/L  HgB A1c  Result Value Ref Range   Hgb A1c MFr Bld 6.2 (H) 4.8 - 5.6 %   Est. average glucose Bld gHb Est-mCnc 131 mg/dL  POCT Glucose (CBG)  Result Value Ref Range   POC Glucose 104 (A) 70 - 99 mg/dl      Assessment & Plan:   Problem List Items Addressed This Visit      Cardiovascular and Mediastinum   Hypertension    Under good control on recheck. Continue current regimen. Refills given. Call with any concerns.       Relevant Medications   amLODipine (NORVASC) 10 MG tablet   cloNIDine (CATAPRES) 0.3 MG tablet   hydrochlorothiazide (HYDRODIURIL) 25 MG tablet   lisinopril (PRINIVIL,ZESTRIL) 10 MG tablet   Other Relevant Orders   Comprehensive metabolic panel   Microalbumin, Urine Waived   Comprehensive metabolic panel     Endocrine   Diabetes mellitus without complication (HCC)    Rechecking A1c today- if as good as last time, will recheck in 6 months.       Relevant Medications   lisinopril (PRINIVIL,ZESTRIL) 10 MG tablet   metFORMIN (GLUCOPHAGE) 500 MG tablet   Other Relevant Orders   Hgb A1c w/o eAG   Comprehensive metabolic panel   Lipid Panel w/o Chol/HDL Ratio   Hgb A1c w/o eAG     Other   Left knee pain - Primary    No better. X-rays done 18 months ago showed arthritis. Will stop meloxicam and ibuprofen and start voltaren. Will get her to see the orthopedist here. Call with any concerns.        Other Visit Diagnoses    Controlled type 2 diabetes mellitus without complication, without long-term current use of insulin (HCC)       Relevant Medications   lisinopril  (PRINIVIL,ZESTRIL) 10 MG tablet   metFORMIN (GLUCOPHAGE) 500 MG tablet   Other Relevant Orders   Comprehensive metabolic panel   Hgb V6P w/o eAG       Follow up plan: Return Needs appointment here with ortho, follow up here 6 months with labs before (orders in).

## 2017-04-10 NOTE — Addendum Note (Signed)
Addended by: Adah Perl on: 04/10/2017 07:18 PM   Modules accepted: Orders

## 2017-04-10 NOTE — Assessment & Plan Note (Signed)
Rechecking A1c today- if as good as last time, will recheck in 6 months.

## 2017-04-10 NOTE — Assessment & Plan Note (Signed)
No better. X-rays done 18 months ago showed arthritis. Will stop meloxicam and ibuprofen and start voltaren. Will get her to see the orthopedist here. Call with any concerns.

## 2017-04-10 NOTE — Assessment & Plan Note (Signed)
Under good control on recheck. Continue current regimen. Refills given. Call with any concerns.

## 2017-04-11 LAB — HGB A1C W/O EAG: HEMOGLOBIN A1C: 6.3 % — AB (ref 4.8–5.6)

## 2017-04-23 ENCOUNTER — Ambulatory Visit: Payer: Self-pay | Admitting: Specialist

## 2017-08-25 ENCOUNTER — Encounter: Payer: Self-pay | Admitting: Unknown Physician Specialty

## 2017-08-25 ENCOUNTER — Ambulatory Visit (INDEPENDENT_AMBULATORY_CARE_PROVIDER_SITE_OTHER): Payer: Self-pay | Admitting: Unknown Physician Specialty

## 2017-08-25 VITALS — BP 134/79 | HR 63 | Temp 97.7°F | Ht 59.7 in | Wt 246.3 lb

## 2017-08-25 DIAGNOSIS — G8929 Other chronic pain: Secondary | ICD-10-CM

## 2017-08-25 DIAGNOSIS — M25562 Pain in left knee: Secondary | ICD-10-CM

## 2017-08-25 DIAGNOSIS — E1140 Type 2 diabetes mellitus with diabetic neuropathy, unspecified: Secondary | ICD-10-CM

## 2017-08-25 DIAGNOSIS — I152 Hypertension secondary to endocrine disorders: Secondary | ICD-10-CM

## 2017-08-25 DIAGNOSIS — I10 Essential (primary) hypertension: Secondary | ICD-10-CM

## 2017-08-25 DIAGNOSIS — E119 Type 2 diabetes mellitus without complications: Secondary | ICD-10-CM

## 2017-08-25 LAB — BAYER DCA HB A1C WAIVED: HB A1C: 5.8 % (ref <7.0)

## 2017-08-25 MED ORDER — LISINOPRIL 10 MG PO TABS: 10.0000 mg | ORAL_TABLET | Freq: Every day | ORAL | 1 refills | Status: DC

## 2017-08-25 MED ORDER — AMLODIPINE BESYLATE 10 MG PO TABS: 10.0000 mg | ORAL_TABLET | Freq: Every day | ORAL | 1 refills | Status: DC

## 2017-08-25 MED ORDER — HYDROCHLOROTHIAZIDE 25 MG PO TABS: 12.5000 mg | ORAL_TABLET | Freq: Every day | ORAL | 1 refills | Status: DC

## 2017-08-25 MED ORDER — CLONIDINE HCL 0.3 MG PO TABS: 0.3000 mg | ORAL_TABLET | Freq: Two times a day (BID) | ORAL | 1 refills | Status: DC

## 2017-08-25 MED ORDER — METFORMIN HCL 500 MG PO TABS: ORAL_TABLET | ORAL | 1 refills | Status: DC

## 2017-08-25 MED ORDER — TRIAMCINOLONE ACETONIDE 40 MG/ML IJ SUSP
40.0000 mg | Freq: Once | INTRAMUSCULAR | Status: AC
  Administered 2017-08-25: 40 mg via INTRAMUSCULAR

## 2017-08-25 NOTE — Assessment & Plan Note (Signed)
See how she does with knee injection.  Refer to Orthopedics for further evaluation

## 2017-08-25 NOTE — Progress Notes (Signed)
BP 134/79   Pulse 63   Temp 97.7 F (36.5 C) (Oral)   Ht 4' 11.7" (1.516 m)   Wt 246 lb 4.8 oz (111.7 kg)   SpO2 98%   BMI 48.59 kg/m    Subjective:    Patient ID: Jillian Pierce, female    DOB: 1956-02-06, 62 y.o.   MRN: [id number]  HPI: Jillian Pierce is a 62 y.o. female  Chief Complaint  Patient presents with  . Establish Care  . Diabetes    pt states she has not had a recent eye exam   Diabetes: Using medications without difficulties No hypoglycemic episodes No hyperglycemic episodes Feet problems: Left foot tingling and cramps.  Does mustard and tonic water Blood Sugars averaging:Not checking eye exam within last year: January of 2019 Last Hgb A1C: 6.3 on 04/10/17  Hypertension  Using medications without difficulty Average home BPs Not checking   Using medication without problems or lightheadedness No chest pain with exertion or shortness of breath No Edema  Elevated Cholesterol Not currently on statin Diet: Exercise: Unable to walk due to knee pain    Left knee Pt with complaints of left knee pain.  Last x-ray 10/16/15 showing moderate degenerative changes.  States it hurts all the time.  Tries to keep I elevated.     Relevant past medical, surgical, family and social history reviewed and updated as indicated. Interim medical history since our last visit reviewed. Allergies and medications reviewed and updated.  Review of Systems  Per HPI unless specifically indicated above     Objective:    BP 134/79   Pulse 63   Temp 97.7 F (36.5 C) (Oral)   Ht 4' 11.7" (1.516 m)   Wt 246 lb 4.8 oz (111.7 kg)   SpO2 98%   BMI 48.59 kg/m   Wt Readings from Last 3 Encounters:  08/25/17 246 lb 4.8 oz (111.7 kg)  04/10/17 249 lb 6.4 oz (113.1 kg)  12/05/16 244 lb 3.2 oz (110.8 kg)    Physical Exam  Constitutional: She is oriented to person, place, and time. She appears well-developed and well-nourished. No distress.  HENT:  Head: Normocephalic and  atraumatic.  Eyes: Conjunctivae and lids are normal. Right eye exhibits no discharge. Left eye exhibits no discharge. No scleral icterus.  Neck: Normal range of motion. Neck supple. No JVD present. Carotid bruit is not present.  Cardiovascular: Normal rate, regular rhythm and normal heart sounds.  Pulmonary/Chest: Effort normal and breath sounds normal.  Abdominal: Normal appearance. There is no splenomegaly or hepatomegaly.  Musculoskeletal: Normal range of motion.  Neurological: She is alert and oriented to person, place, and time.  Skin: Skin is warm, dry and intact. No rash noted. No pallor.  Psychiatric: She has a normal mood and affect. Her behavior is normal. Judgment and thought content normal.   STEROID INJECTION  Procedure: Knee Intraarticular Steroid Injection   Description: After verbal consent and patient ed on Area prepped and draped using  semi-sterile technique. Using a anterior  approach, a mixture of 4 cc of  1% Marcaine & 1 cc of Kenalog 40 was injected into knee joint.  A bandage was then placed over the injection site. Complications:  none Post Procedure Instructions: To the ER if any symptoms of erythema or swelling.       Results for orders placed or performed in visit on 04/10/17  Hgb A1c w/o eAG  Result Value Ref Range   Hgb A1c MFr Bld  6.3 (H) 4.8 - 5.6 %      Assessment & Plan:   Problem List Items Addressed This Visit      Unprioritized   Hypertension    Stable, continue present medications.        Relevant Medications   lisinopril (PRINIVIL,ZESTRIL) 10 MG tablet   hydrochlorothiazide (HYDRODIURIL) 25 MG tablet   amLODipine (NORVASC) 10 MG tablet   cloNIDine (CATAPRES) 0.3 MG tablet   Other Relevant Orders   Comprehensive metabolic panel   Lipid Panel w/o Chol/HDL Ratio   Left knee pain - Primary    See how she does with knee injection.  Refer to Orthopedics for further evaluation       Relevant Medications   triamcinolone acetonide  (KENALOG-40) injection 40 mg (Completed)   Other Relevant Orders   Ambulatory referral to Orthopedic Surgery   Type 2 diabetes mellitus with diabetic neuropathy, unspecified ([place])    Hgb A1C is 5.8.  This is stable.  Continue present medications      Relevant Medications   lisinopril (PRINIVIL,ZESTRIL) 10 MG tablet   metFORMIN (GLUCOPHAGE) 500 MG tablet   Other Relevant Orders   Bayer DCA Hb A1c Waived    Other Visit Diagnoses    Controlled type 2 diabetes mellitus without complication, without long-term current use of insulin (HCC)       Relevant Medications   lisinopril (PRINIVIL,ZESTRIL) 10 MG tablet   metFORMIN (GLUCOPHAGE) 500 MG tablet       Follow up plan: Return in about 3 months (around 11/25/2017).

## 2017-08-25 NOTE — Assessment & Plan Note (Signed)
Stable, continue present medications.   

## 2017-08-25 NOTE — Assessment & Plan Note (Signed)
Hgb A1C is 5.8.  This is stable.  Continue present medications

## 2017-08-26 ENCOUNTER — Encounter: Payer: Self-pay | Admitting: Unknown Physician Specialty

## 2017-08-26 LAB — COMPREHENSIVE METABOLIC PANEL
A/G RATIO: 1.7 (ref 1.2–2.2)
ALBUMIN: 4.3 g/dL (ref 3.6–4.8)
ALT: 14 IU/L (ref 0–32)
AST: 17 IU/L (ref 0–40)
Alkaline Phosphatase: 78 IU/L (ref 39–117)
BUN / CREAT RATIO: 14 (ref 12–28)
BUN: 12 mg/dL (ref 8–27)
Bilirubin Total: 0.2 mg/dL (ref 0.0–1.2)
CALCIUM: 9.4 mg/dL (ref 8.7–10.3)
CO2: 24 mmol/L (ref 20–29)
Chloride: 102 mmol/L (ref 96–106)
Creatinine, Ser: 0.88 mg/dL (ref 0.57–1.00)
GFR, EST AFRICAN AMERICAN: 81 mL/min/{1.73_m2} (ref >59)
GFR, EST NON AFRICAN AMERICAN: 70 mL/min/{1.73_m2} (ref >59)
GLOBULIN, TOTAL: 2.6 g/dL (ref 1.5–4.5)
Glucose: 77 mg/dL (ref 65–99)
POTASSIUM: 4 mmol/L (ref 3.5–5.2)
SODIUM: 141 mmol/L (ref 134–144)
TOTAL PROTEIN: 6.9 g/dL (ref 6.0–8.5)

## 2017-08-26 LAB — LIPID PANEL W/O CHOL/HDL RATIO
Cholesterol, Total: 164 mg/dL (ref 100–199)
HDL: 44 mg/dL (ref >39)
LDL Calculated: 95 mg/dL (ref 0–99)
Triglycerides: 127 mg/dL (ref 0–149)
VLDL Cholesterol Cal: 25 mg/dL (ref 5–40)

## 2017-09-18 ENCOUNTER — Telehealth: Payer: Self-pay

## 2017-09-18 NOTE — Telephone Encounter (Signed)
Pt called requesting a return call. Called pt no answer. vm is not set up

## 2017-10-02 ENCOUNTER — Other Ambulatory Visit: Payer: Medicaid Other

## 2017-10-02 DIAGNOSIS — E119 Type 2 diabetes mellitus without complications: Secondary | ICD-10-CM

## 2017-10-02 DIAGNOSIS — I10 Essential (primary) hypertension: Secondary | ICD-10-CM

## 2017-10-02 DIAGNOSIS — I152 Hypertension secondary to endocrine disorders: Secondary | ICD-10-CM

## 2017-10-03 LAB — LIPID PANEL
CHOLESTEROL TOTAL: 185 mg/dL (ref 100–199)
Chol/HDL Ratio: 3.6 ratio (ref 0.0–4.4)
HDL: 51 mg/dL (ref >39)
LDL Calculated: 106 mg/dL — ABNORMAL HIGH (ref 0–99)
TRIGLYCERIDES: 138 mg/dL (ref 0–149)
VLDL Cholesterol Cal: 28 mg/dL (ref 5–40)

## 2017-10-03 LAB — HEMOGLOBIN A1C
ESTIMATED AVERAGE GLUCOSE: 131 mg/dL
Hgb A1c MFr Bld: 6.2 % — ABNORMAL HIGH (ref 4.8–5.6)

## 2017-10-09 ENCOUNTER — Encounter: Payer: Self-pay | Admitting: Family Medicine

## 2017-10-09 ENCOUNTER — Ambulatory Visit: Payer: Self-pay

## 2017-10-09 ENCOUNTER — Ambulatory Visit: Payer: Medicaid Other | Admitting: Family Medicine

## 2017-10-09 VITALS — BP 150/93 | HR 95 | Temp 97.8°F | Ht <= 58 in | Wt 245.2 lb

## 2017-10-09 DIAGNOSIS — G8929 Other chronic pain: Secondary | ICD-10-CM

## 2017-10-09 DIAGNOSIS — Z09 Encounter for follow-up examination after completed treatment for conditions other than malignant neoplasm: Secondary | ICD-10-CM

## 2017-10-09 DIAGNOSIS — Z6841 Body Mass Index (BMI) 40.0 and over, adult: Secondary | ICD-10-CM

## 2017-10-09 DIAGNOSIS — E119 Type 2 diabetes mellitus without complications: Secondary | ICD-10-CM

## 2017-10-09 DIAGNOSIS — I10 Essential (primary) hypertension: Secondary | ICD-10-CM

## 2017-10-09 DIAGNOSIS — M25562 Pain in left knee: Secondary | ICD-10-CM

## 2017-10-09 MED ORDER — DICLOFENAC SODIUM 75 MG PO TBEC: 75.0000 mg | DELAYED_RELEASE_TABLET | Freq: Two times a day (BID) | ORAL | 4 refills | Status: DC

## 2017-10-09 MED ORDER — GABAPENTIN 300 MG PO CAPS: 300.0000 mg | ORAL_CAPSULE | Freq: Three times a day (TID) | ORAL | 1 refills | Status: DC

## 2017-10-09 MED ORDER — METFORMIN HCL 500 MG PO TABS: ORAL_TABLET | ORAL | 1 refills | Status: DC

## 2017-10-09 MED ORDER — CLONIDINE HCL 0.3 MG PO TABS: 0.3000 mg | ORAL_TABLET | Freq: Two times a day (BID) | ORAL | 1 refills | Status: DC

## 2017-10-09 MED ORDER — LISINOPRIL 10 MG PO TABS: 10.0000 mg | ORAL_TABLET | Freq: Every day | ORAL | 1 refills | Status: DC

## 2017-10-09 MED ORDER — AMLODIPINE BESYLATE 10 MG PO TABS: 10.0000 mg | ORAL_TABLET | Freq: Every day | ORAL | 1 refills | Status: DC

## 2017-10-09 MED ORDER — HYDROCHLOROTHIAZIDE 25 MG PO TABS: 12.5000 mg | ORAL_TABLET | Freq: Every day | ORAL | 1 refills | Status: DC

## 2017-10-09 MED ORDER — IBUPROFEN 800 MG PO TABS: 800.0000 mg | ORAL_TABLET | Freq: Three times a day (TID) | ORAL | 2 refills | Status: DC | PRN

## 2017-10-09 NOTE — Progress Notes (Signed)
Subjective:    Patient ID: Jillian Pierce, female    DOB: [birth year], 62 y.o.   MRN: [id number]   PCP: Kathe Becton, NP  Chief Complaint  Patient presents with  . Follow-up    HPI  Jillian Pierce has a history of of Obestiy, Left Knee Pain, Hypertension, and Diabetes. She is here for follow up.   Current Status: She is doing well with no complaints. She denies fevers, chills, fatigue, recent infections, weight loss, and night sweats.   She has not had any headaches, visual changes, dizziness, and falls.   No chest pain, heart palpitations, cough and shortness of breath reported.   No reports of GI problems. She has no reports of blood in stools, dysuria and hematuria.   No depression or anxiety. She has chronic pain in her shoulders, hands, and left knee. She has occasional numbness in her hands and feet.   She is currently in the process of applying for Medicaid. Her current plan will not cover referrals.   Past Medical History:  Diagnosis Date  . Diabetes mellitus without complication ([place])   . Hypertension   . Left knee pain 05/17/2014  . Morbid obesity ([place]) 02/15/2016    Family History  Problem Relation Age of Onset  . Hypertension Mother   . Diabetes Mother   . Kidney disease Mother   . Hypertension Father   . Diabetes Father   . Heart failure Father   . Hypertension Son   . Heart disease Sister   . Heart disease Brother     Social History   Socioeconomic History  . Marital status: Single    Spouse name: Not on file  . Number of children: Not on file  . Years of education: Not on file  . Highest education level: Not on file  Occupational History  . Not on file  Social Needs  . Financial resource strain: Patient refused  . Food insecurity:    Worry: Patient refused    Inability: Patient refused  . Transportation needs:    Medical: Patient refused    Non-medical: Patient refused  Tobacco Use  . Smoking status: Never Smoker  . Smokeless tobacco:  Never Used  Substance and Sexual Activity  . Alcohol use: No  . Drug use: No  . Sexual activity: Not Currently  Lifestyle  . Physical activity:    Days per week: Not on file    Minutes per session: Not on file  . Stress: Not on file  Relationships  . Social connections:    Talks on phone: Not on file    Gets together: Not on file    Attends religious service: Not on file    Active member of club or organization: Not on file    Attends meetings of clubs or organizations: Not on file    Relationship status: Not on file  . Intimate partner violence:    Fear of current or ex partner: Not on file    Emotionally abused: Not on file    Physically abused: Not on file    Forced sexual activity: Not on file  Other Topics Concern  . Not on file  Social History Narrative  . Not on file    Past Surgical History:  Procedure Laterality Date  . ABDOMINAL HYSTERECTOMY     complete  . ACHILLES TENDON SURGERY Left   . HERNIA REPAIR  11/2014  . INSERTION OF MESH N/A 11/23/2014   Procedure: INSERTION OF MESH;  Surgeon: Sherri Rad, MD;  Location: [facility] ORS;  Service: General;  Laterality: N/A;  . VENTRAL HERNIA REPAIR N/A 11/23/2014   Procedure: HERNIA REPAIR VENTRAL ADULT;  Surgeon: Sherri Rad, MD;  Location: [facility] ORS;  Service: General;  Laterality: N/A;    Immunization History  Administered Date(s) Administered  . Influenza Inj Mdck Quad Pf 02/15/2016  . Influenza-Unspecified 02/25/2013, 02/21/2015    Current Meds  Medication Sig  . aspirin EC 325 MG tablet Take 325 mg by mouth daily.  . [DISCONTINUED] amLODipine (NORVASC) 10 MG tablet Take 1 tablet (10 mg total) by mouth daily.  . [DISCONTINUED] cloNIDine (CATAPRES) 0.3 MG tablet Take 1 tablet (0.3 mg total) by mouth 2 (two) times daily. Reported on 10/03/2015  . [DISCONTINUED] diclofenac (VOLTAREN) 75 MG EC tablet Take 1 tablet (75 mg total) by mouth 2 (two) times daily.  . [DISCONTINUED] hydrochlorothiazide (HYDRODIURIL) 25 MG tablet  Take 0.5 tablets (12.5 mg total) by mouth daily.  . [DISCONTINUED] lisinopril (PRINIVIL,ZESTRIL) 10 MG tablet Take 1 tablet (10 mg total) by mouth daily.  . [DISCONTINUED] metFORMIN (GLUCOPHAGE) 500 MG tablet One half tablet by mouth twice a day    No Known Allergies  BP (!) 150/93   Pulse 95   Temp 97.8 F (36.6 C)   Ht 4\' 10"  (1.473 m)   Wt 245 lb 3.2 oz (111.2 kg)   BMI 51.25 kg/m   Review of Systems  Constitutional: Negative.   HENT: Negative.   Eyes: Negative.   Respiratory: Negative.   Cardiovascular: Negative.   Gastrointestinal: Negative.   Endocrine: Negative.   Genitourinary: Negative.   Musculoskeletal: Positive for arthralgias (chronic left knee pain ).  Skin: Negative.   Allergic/Immunologic: Negative.   Neurological: Negative.   Hematological: Negative.   Psychiatric/Behavioral: Negative.    Objective:   Physical Exam  Constitutional: She appears well-developed and well-nourished.  HENT:  Head: Normocephalic and atraumatic.  Right Ear: External ear normal.  Left Ear: External ear normal.  Nose: Nose normal.  Mouth/Throat: Oropharynx is clear and moist.  Cardiovascular: Normal rate, regular rhythm, normal heart sounds and intact distal pulses.  Musculoskeletal: She exhibits edema (left knee).  Nursing note and vitals reviewed.  Assessment & Plan:   1. Essential hypertension Blood pressure is 150/93 today. She will continue Amlodipine, Clonidine, HCTZ, and Lisinopril as directed. We will not makie any dose adjustments at this time. We will re-evaluate effectiveness of anti-hypertensive medications at that time.  - cloNIDine (CATAPRES) 0.3 MG tablet; Take 1 tablet (0.3 mg total) by mouth 2 (two) times daily. Reported on 10/03/2015  Dispense: 180 tablet; Refill: 1 - lisinopril (PRINIVIL,ZESTRIL) 10 MG tablet; Take 1 tablet (10 mg total) by mouth daily.  Dispense: 90 tablet; Refill: 1  2. Controlled type 2 diabetes mellitus without complication, without  long-term current use of insulin (HCC) Improving. Hgb A1c at 6.2 on 10/02/2017, decreased from 6.3. She will continue Metformin as directed. She will continue to follow DASH diet, increase water intake, and get at least 30 minutes of cardio physical activity in on a daily basis.  - metFORMIN (GLUCOPHAGE) 500 MG tablet; One half tablet by mouth twice a day  Dispense: 180 tablet; Refill: 1  3. Chronic pain of left knee Worsening. We will send Rx for Motrin for inflammation and Neurontin. Monitor. - diclofenac (VOLTAREN) 75 MG EC tablet; Take 1 tablet (75 mg total) by mouth 2 (two) times daily.  Dispense: 60 tablet; Refill: 4 - ibuprofen (ADVIL,MOTRIN) 800 MG tablet; Take  1 tablet (800 mg total) by mouth every 8 (eight) hours as needed.  Dispense: 30 tablet; Refill: 2 - gabapentin (NEURONTIN) 300 MG capsule; Take 1 capsule (300 mg total) by mouth 3 (three) times daily.  Dispense: 90 capsule; Refill: 1  4. Class 3 severe obesity due to excess calories with body mass index (BMI) of 50.0 to 59.9 in adult, unspecified whether serious comorbidity present [facility]) Physical activity is difficult because of chronic left knee pain. She does continue to modify her diet though. Weight is stable at 245, and BMI is 51.25 today. She will continue decreasing high sodium intake, excessive alcohol intake, increase potassium intake, smoking cessation, and increase physical activity. Follow DASH diet.   - amLODipine (NORVASC) 10 MG tablet; Take 1 tablet (10 mg total) by mouth daily.  Dispense: 90 tablet; Refill: 1 - hydrochlorothiazide (HYDRODIURIL) 25 MG tablet; Take 0.5 tablets (12.5 mg total) by mouth daily.  Dispense: 45 tablet; Refill: 1  5. Follow up She will follow up 1 month for evaluation on effectiveness of new medications.   Meds ordered this encounter  Medications  . amLODipine (NORVASC) 10 MG tablet    Sig: Take 1 tablet (10 mg total) by mouth daily.    Dispense:  90 tablet    Refill:  1  . cloNIDine  (CATAPRES) 0.3 MG tablet    Sig: Take 1 tablet (0.3 mg total) by mouth 2 (two) times daily. Reported on 10/03/2015    Dispense:  180 tablet    Refill:  1  . diclofenac (VOLTAREN) 75 MG EC tablet    Sig: Take 1 tablet (75 mg total) by mouth 2 (two) times daily.    Dispense:  60 tablet    Refill:  4  . hydrochlorothiazide (HYDRODIURIL) 25 MG tablet    Sig: Take 0.5 tablets (12.5 mg total) by mouth daily.    Dispense:  45 tablet    Refill:  1  . lisinopril (PRINIVIL,ZESTRIL) 10 MG tablet    Sig: Take 1 tablet (10 mg total) by mouth daily.    Dispense:  90 tablet    Refill:  1  . metFORMIN (GLUCOPHAGE) 500 MG tablet    Sig: One half tablet by mouth twice a day    Dispense:  180 tablet    Refill:  1  . ibuprofen (ADVIL,MOTRIN) 800 MG tablet    Sig: Take 1 tablet (800 mg total) by mouth every 8 (eight) hours as needed.    Dispense:  30 tablet    Refill:  2  . gabapentin (NEURONTIN) 300 MG capsule    Sig: Take 1 capsule (300 mg total) by mouth 3 (three) times daily.    Dispense:  90 capsule    Refill:  [place],  MSN, FNP-BC [facility] [address] [place], [place] [address] [phone]

## 2017-10-14 ENCOUNTER — Telehealth: Payer: Self-pay

## 2017-10-14 NOTE — Telephone Encounter (Signed)
Pt contacted. Instructed her to take all meds as prescribed. Will f/u next month.

## 2017-10-14 NOTE — Telephone Encounter (Signed)
-----   Message from Azzie Glatter, [place] sent at 10/12/2017  8:14 PM EDT ----- Regarding: "Blood Pressures" Jillian Pierce,  Please contact patient and let her know that we may have to increase or either add an additional BP medication if her blood pressures continue to be elevated. Last BP reading was 150/93.  She is scheduled to follow up with [place] in 1 month.   Please remind her that it is important for her to take all BP meds as prescribed every day.   Thanks Jillian Pierce!

## 2017-11-06 ENCOUNTER — Ambulatory Visit: Payer: Medicaid Other

## 2017-11-25 ENCOUNTER — Ambulatory Visit: Payer: Medicaid Other | Admitting: Unknown Physician Specialty

## 2017-12-05 ENCOUNTER — Ambulatory Visit: Payer: Self-pay | Admitting: Family Medicine

## 2018-01-12 ENCOUNTER — Ambulatory Visit: Payer: Medicaid Other | Admitting: Family Medicine

## 2018-01-13 ENCOUNTER — Ambulatory Visit (INDEPENDENT_AMBULATORY_CARE_PROVIDER_SITE_OTHER): Payer: Medicare Other | Admitting: Physician Assistant

## 2018-01-13 ENCOUNTER — Encounter: Payer: Self-pay | Admitting: Physician Assistant

## 2018-01-13 VITALS — BP 139/81 | HR 84 | Temp 98.5°F | Ht <= 58 in | Wt 245.2 lb

## 2018-01-13 DIAGNOSIS — G8929 Other chronic pain: Secondary | ICD-10-CM | POA: Diagnosis not present

## 2018-01-13 DIAGNOSIS — Z23 Encounter for immunization: Secondary | ICD-10-CM | POA: Diagnosis not present

## 2018-01-13 DIAGNOSIS — M25562 Pain in left knee: Secondary | ICD-10-CM | POA: Diagnosis not present

## 2018-01-13 DIAGNOSIS — E785 Hyperlipidemia, unspecified: Secondary | ICD-10-CM

## 2018-01-13 DIAGNOSIS — E1140 Type 2 diabetes mellitus with diabetic neuropathy, unspecified: Secondary | ICD-10-CM

## 2018-01-13 DIAGNOSIS — I10 Essential (primary) hypertension: Secondary | ICD-10-CM | POA: Diagnosis not present

## 2018-01-13 NOTE — Patient Instructions (Signed)
Pneumococcal Polysaccharide Vaccine: What You Need to Know 1. Why get vaccinated? Vaccination can protect older adults (and some children and younger adults) from pneumococcal disease. Pneumococcal disease is caused by bacteria that can spread from person to person through close contact. It can cause ear infections, and it can also lead to more serious infections of the:  Lungs (pneumonia),  Blood (bacteremia), and  Covering of the brain and spinal cord (meningitis). Meningitis can cause deafness and brain damage, and it can be fatal.  Anyone can get pneumococcal disease, but children under 52 years of age, people with certain medical conditions, adults over 104 years of age, and cigarette smokers are at the highest risk. About 18,000 older adults die each year from pneumococcal disease in the [place]. Treatment of pneumococcal infections with penicillin and other drugs used to be more effective. But some strains of the disease have become resistant to these drugs. This makes prevention of the disease, through vaccination, even more important. 2. Pneumococcal polysaccharide vaccine (PPSV23) Pneumococcal polysaccharide vaccine (PPSV23) protects against 23 types of pneumococcal bacteria. It will not prevent all pneumococcal disease. PPSV23 is recommended for:  All adults 31 years of age and older,  Anyone 2 through 63 years of age with certain long-term health problems,  Anyone 2 through 63 years of age with a weakened immune system,  Adults 35 through 63 years of age who smoke cigarettes or have asthma.  Most people need only one dose of PPSV. A second dose is recommended for certain high-risk groups. People 73 and older should get a dose even if they have gotten one or more doses of the vaccine before they turned 65. Your healthcare provider can give you more information about these recommendations. Most healthy adults develop protection within 2 to 3 weeks of getting the shot. 3.  Some people should not get this vaccine  Anyone who has had a life-threatening allergic reaction to PPSV should not get another dose.  Anyone who has a severe allergy to any component of PPSV should not receive it. Tell your provider if you have any severe allergies.  Anyone who is moderately or severely ill when the shot is scheduled may be asked to wait until they recover before getting the vaccine. Someone with a mild illness can usually be vaccinated.  Children less than 64 years of age should not receive this vaccine.  There is no evidence that PPSV is harmful to either a pregnant woman or to her fetus. However, as a precaution, women who need the vaccine should be vaccinated before becoming pregnant, if possible. 4. Risks of a vaccine reaction With any medicine, including vaccines, there is a chance of side effects. These are usually mild and go away on their own, but serious reactions are also possible. About half of people who get PPSV have mild side effects, such as redness or pain where the shot is given, which go away within about two days. Less than 1 out of 100 people develop a fever, muscle aches, or more severe local reactions. Problems that could happen after any vaccine:  People sometimes faint after a medical procedure, including vaccination. Sitting or lying down for about 15 minutes can help prevent fainting, and injuries caused by a fall. Tell your doctor if you feel dizzy, or have vision changes or ringing in the ears.  Some people get severe pain in the shoulder and have difficulty moving the arm where a shot was given. This happens very rarely.  Any  medication can cause a severe allergic reaction. Such reactions from a vaccine are very rare, estimated at about 1 in a million doses, and would happen within a few minutes to a few hours after the vaccination. As with any medicine, there is a very remote chance of a vaccine causing a serious injury or death. The safety of  vaccines is always being monitored. For more information, visit: http://www.aguilar.org/ 5. What if there is a serious reaction? What should I look for? Look for anything that concerns you, such as signs of a severe allergic reaction, very high fever, or unusual behavior. Signs of a severe allergic reaction can include hives, swelling of the face and throat, difficulty breathing, a fast heartbeat, dizziness, and weakness. These would usually start a few minutes to a few hours after the vaccination. What should I do? If you think it is a severe allergic reaction or other emergency that can't wait, call 9-1-1 or get to the nearest hospital. Otherwise, call your doctor. Afterward, the reaction should be reported to the Vaccine Adverse Event Reporting System (VAERS). Your doctor might file this report, or you can do it yourself through the VAERS web site at www.vaers.SamedayNews.es, or by calling 873-168-4603. VAERS does not give medical advice. 6. How can I learn more?  Ask your doctor. He or she can give you the vaccine package insert or suggest other sources of information.  Call your local or state health department.  Contact the Centers for Disease Control and Prevention (CDC): ? Call (712)740-2354 (1-800-CDC-INFO) or ? Visit CDC's website at http://hunter.com/ CDC Pneumococcal Polysaccharide Vaccine VIS (08/13/13) This information is not intended to replace advice given to you by your health care provider. Make sure you discuss any questions you have with your health care provider. Document Released: 02/03/2006 Document Revised: 12/28/2015 Document Reviewed: 12/28/2015 Elsevier Interactive Patient Education  2017 [place]. Influenza (Flu) Vaccine (Inactivated or Recombinant): What You Need to Know 1. Why get vaccinated? Influenza ("flu") is a contagious disease that spreads around the [place] every year, usually between October and May. Flu is caused by influenza viruses, and is  spread mainly by coughing, sneezing, and close contact. Anyone can get flu. Flu strikes suddenly and can last several days. Symptoms vary by age, but can include:  fever/chills  sore throat  muscle aches  fatigue  cough  headache  runny or stuffy nose  Flu can also lead to pneumonia and blood infections, and cause diarrhea and seizures in children. If you have a medical condition, such as heart or lung disease, flu can make it worse. Flu is more dangerous for some people. Infants and young children, people 61 years of age and older, pregnant women, and people with certain health conditions or a weakened immune system are at greatest risk. Each year thousands of people in the [place] die from flu, and many more are hospitalized. Flu vaccine can:  keep you from getting flu,  make flu less severe if you do get it, and  keep you from spreading flu to your family and other people. 2. Inactivated and recombinant flu vaccines A dose of flu vaccine is recommended every flu season. Children 6 months through 75 years of age may need two doses during the same flu season. Everyone else needs only one dose each flu season. Some inactivated flu vaccines contain a very small amount of a mercury-based preservative called thimerosal. Studies have not shown thimerosal in vaccines to be harmful, but flu vaccines that do  not contain thimerosal are available. There is no live flu virus in flu shots. They cannot cause the flu. There are many flu viruses, and they are always changing. Each year a new flu vaccine is made to protect against three or four viruses that are likely to cause disease in the upcoming flu season. But even when the vaccine doesn't exactly match these viruses, it may still provide some protection. Flu vaccine cannot prevent:  flu that is caused by a virus not covered by the vaccine, or  illnesses that look like flu but are not.  It takes about 2 weeks for protection to  develop after vaccination, and protection lasts through the flu season. 3. Some people should not get this vaccine Tell the person who is giving you the vaccine:  If you have any severe, life-threatening allergies. If you ever had a life-threatening allergic reaction after a dose of flu vaccine, or have a severe allergy to any part of this vaccine, you may be advised not to get vaccinated. Most, but not all, types of flu vaccine contain a small amount of egg protein.  If you ever had Guillain-Barr Syndrome (also called GBS). Some people with a history of GBS should not get this vaccine. This should be discussed with your doctor.  If you are not feeling well. It is usually okay to get flu vaccine when you have a mild illness, but you might be asked to come back when you feel better.  4. Risks of a vaccine reaction With any medicine, including vaccines, there is a chance of reactions. These are usually mild and go away on their own, but serious reactions are also possible. Most people who get a flu shot do not have any problems with it. Minor problems following a flu shot include:  soreness, redness, or swelling where the shot was given  hoarseness  sore, red or itchy eyes  cough  fever  aches  headache  itching  fatigue  If these problems occur, they usually begin soon after the shot and last 1 or 2 days. More serious problems following a flu shot can include the following:  There may be a small increased risk of Guillain-Barre Syndrome (GBS) after inactivated flu vaccine. This risk has been estimated at 1 or 2 additional cases per million people vaccinated. This is much lower than the risk of severe complications from flu, which can be prevented by flu vaccine.  Young children who get the flu shot along with pneumococcal vaccine (PCV13) and/or DTaP vaccine at the same time might be slightly more likely to have a seizure caused by fever. Ask your doctor for more information.  Tell your doctor if a child who is getting flu vaccine has ever had a seizure.  Problems that could happen after any injected vaccine:  People sometimes faint after a medical procedure, including vaccination. Sitting or lying down for about 15 minutes can help prevent fainting, and injuries caused by a fall. Tell your doctor if you feel dizzy, or have vision changes or ringing in the ears.  Some people get severe pain in the shoulder and have difficulty moving the arm where a shot was given. This happens very rarely.  Any medication can cause a severe allergic reaction. Such reactions from a vaccine are very rare, estimated at about 1 in a million doses, and would happen within a few minutes to a few hours after the vaccination. As with any medicine, there is a very remote chance of a  vaccine causing a serious injury or death. The safety of vaccines is always being monitored. For more information, visit: www.cdc.gov/vaccinesafety/ 5. What if there is a serious reaction? What should I look for? Look for anything that concerns you, such as signs of a severe allergic reaction, very high fever, or unusual behavior. Signs of a severe allergic reaction can include hives, swelling of the face and throat, difficulty breathing, a fast heartbeat, dizziness, and weakness. These would start a few minutes to a few hours after the vaccination. What should I do?  If you think it is a severe allergic reaction or other emergency that can't wait, call 9-1-1 and get the person to the nearest hospital. Otherwise, call your doctor.  Reactions should be reported to the Vaccine Adverse Event Reporting System (VAERS). Your doctor should file this report, or you can do it yourself through the VAERS web site at www.vaers.hhs.gov, or by calling 1-800-822-7967. ? VAERS does not give medical advice. 6. The National Vaccine Injury Compensation Program The National Vaccine Injury Compensation Program (VICP) is a federal  program that was created to compensate people who may have been injured by certain vaccines. Persons who believe they may have been injured by a vaccine can learn about the program and about filing a claim by calling 1-800-338-2382 or visiting the VICP website at www.hrsa.gov/vaccinecompensation. There is a time limit to file a claim for compensation. 7. How can I learn more?  Ask your healthcare provider. He or she can give you the vaccine package insert or suggest other sources of information.  Call your local or state health department.  Contact the Centers for Disease Control and Prevention (CDC): ? Call 1-800-232-4636 (1-800-CDC-INFO) or ? Visit CDC's website at www.cdc.gov/flu Vaccine Information Statement, Inactivated Influenza Vaccine (11/26/2013) This information is not intended to replace advice given to you by your health care provider. Make sure you discuss any questions you have with your health care provider. Document Released: 01/31/2006 Document Revised: 12/28/2015 Document Reviewed: 12/28/2015 Elsevier Interactive Patient Education  2017 [employer]  

## 2018-01-13 NOTE — Progress Notes (Signed)
Subjective:    Patient ID: Jillian Pierce, female    DOB: 1956-03-09, 62 y.o.   MRN: [id number]  Jillian Pierce is a 62 y.o. female presenting on 01/13/2018 for Diabetes; Hyperlipidemia; and Hypertension   HPI   Diabetes mellitus: Currently taking Metformin 500 mg BID. Does not check her sugars. Last A1c was 6.2 on 09/2017.   Eye Exams: not UTD, used to be seen at [facility]. Willing to be referred today.   HTN: amlodipine 10 mg daily, clonidine 0.3 mg BID, HCTZ 25 mg daily, and lisinopril 10 mg QD.    BP Readings from Last 3 Encounters:  01/13/18 139/81  10/09/17 (!) 150/93  08/25/17 134/79   Wt Readings from Last 3 Encounters:  01/13/18 245 lb 3.2 oz (111.2 kg)  10/09/17 245 lb 3.2 oz (111.2 kg)  08/25/17 246 lb 4.8 oz (111.7 kg)   HLD: Type II DM well controlled but LDL is above goal. She is not on statin therapy. Has never been on statin therapy.   Lipid Panel     Component Value Date/Time   CHOL 174 01/13/2018 1040   TRIG 98 01/13/2018 1040   HDL 46 01/13/2018 1040   CHOLHDL 3.8 01/13/2018 1040   LDLCALC 108 (H) 01/13/2018 1040   Chronic Knee Pain: Takes diclofenac and gabapentin for this. Received steroid injection 08/2017 which she reports was not very helpful. Knee Xray from 2017 shows moderate degenerative disease. Was referred to orthopedics 08/2017 but was never contacted.   Social History   Tobacco Use  . Smoking status: Never Smoker  . Smokeless tobacco: Never Used  Substance Use Topics  . Alcohol use: No  . Drug use: No    Review of Systems Per HPI unless specifically indicated above     Objective:    BP 139/81 (BP Location: Left Arm, Cuff Size: Large)   Pulse 84   Temp 98.5 F (36.9 C) (Oral)   Ht 4' 10"  (1.473 m)   Wt 245 lb 3.2 oz (111.2 kg)   SpO2 98%   BMI 51.25 kg/m   Wt Readings from Last 3 Encounters:  01/13/18 245 lb 3.2 oz (111.2 kg)  10/09/17 245 lb 3.2 oz (111.2 kg)  08/25/17 246 lb 4.8 oz (111.7 kg)    Physical Exam    Diabetic Foot Exam - Simple   Simple Foot Form Diabetic Foot exam was performed with the following findings:  Yes 01/13/2018 10:35 AM  Visual Inspection No deformities, no ulcerations, no other skin breakdown bilaterally:  Yes Sensation Testing Intact to touch and monofilament testing bilaterally:  Yes Pulse Check Posterior Tibialis and Dorsalis pulse intact bilaterally:  Yes Comments     Results for orders placed or performed in visit on 01/13/18  HgB A1c  Result Value Ref Range   Hgb A1c MFr Bld WILL FOLLOW    Est. average glucose Bld gHb Est-mCnc WILL FOLLOW   Lipid Profile  Result Value Ref Range   Cholesterol, Total 174 100 - 199 mg/dL   Triglycerides 98 0 - 149 mg/dL   HDL 46 >39 mg/dL   VLDL Cholesterol Cal 20 5 - 40 mg/dL   LDL Calculated 108 (H) 0 - 99 mg/dL   Chol/HDL Ratio 3.8 0.0 - 4.4 ratio  Comp Met (CMET)  Result Value Ref Range   Glucose 160 (H) 65 - 99 mg/dL   BUN 13 8 - 27 mg/dL   Creatinine, Ser 0.91 0.57 - 1.00 mg/dL   GFR  calc non Af Amer 67 >59 mL/min/1.73   GFR calc Af Amer 78 >59 mL/min/1.73   BUN/Creatinine Ratio 14 12 - 28   Sodium 143 134 - 144 mmol/L   Potassium 3.9 3.5 - 5.2 mmol/L   Chloride 104 96 - 106 mmol/L   CO2 20 20 - 29 mmol/L   Calcium 9.7 8.7 - 10.3 mg/dL   Total Protein 7.0 6.0 - 8.5 g/dL   Albumin 4.3 3.6 - 4.8 g/dL   Globulin, Total 2.7 1.5 - 4.5 g/dL   Albumin/Globulin Ratio 1.6 1.2 - 2.2   Bilirubin Total 0.3 0.0 - 1.2 mg/dL   Alkaline Phosphatase 80 39 - 117 IU/L   AST 14 0 - 40 IU/L   ALT 11 0 - 32 IU/L  CBC with Differential  Result Value Ref Range   WBC 5.5 3.4 - 10.8 x10E3/uL   RBC 4.43 3.77 - 5.28 x10E6/uL   Hemoglobin 11.3 11.1 - 15.9 g/dL   Hematocrit 35.6 34.0 - 46.6 %   MCV 80 79 - 97 fL   MCH 25.5 (L) 26.6 - 33.0 pg   MCHC 31.7 31.5 - 35.7 g/dL   RDW 14.5 12.3 - 15.4 %   Platelets 425 150 - 450 x10E3/uL   Neutrophils 62 Not Estab. %   Lymphs 24 Not Estab. %   Monocytes 8 Not Estab. %   Eos 5 Not  Estab. %   Basos 1 Not Estab. %   Neutrophils Absolute 3.4 1.4 - 7.0 x10E3/uL   Lymphocytes Absolute 1.3 0.7 - 3.1 x10E3/uL   Monocytes Absolute 0.4 0.1 - 0.9 x10E3/uL   EOS (ABSOLUTE) 0.3 0.0 - 0.4 x10E3/uL   Basophils Absolute 0.0 0.0 - 0.2 x10E3/uL   Immature Granulocytes 0 Not Estab. %   Immature Grans (Abs) 0.0 0.0 - 0.1 x10E3/uL      Assessment & Plan:  1. Type 2 diabetes mellitus with diabetic neuropathy, without long-term current use of insulin (HCC)  Well controlled based on previous A!C. Continue current treatments. Refer for eye exams. Vaccinations updated today.   - HgB A1c - Lipid Profile - CBC with Differential - Ambulatory referral to Ophthalmology  2. Essential hypertension  Continue medication.   - Comp Met (CMET) - CBC with Differential  3. Chronic pain of left knee  - Ambulatory referral to Orthopedics  4. Need for pneumococcal vaccination  - Pneumococcal polysaccharide vaccine 23-valent greater than or equal to 2yo subcutaneous/IM  5. Need for influenza vaccination  - Flu Vaccine QUAD 36+ mos IM  6. Hyperlipidemia, unspecified hyperlipidemia type  LDL not to goal, needs to be on statin. Will start 10 mg Lipitor, counseled on side effects.    Follow up plan: Return in about 6 weeks (around 02/24/2018) for HLD.  Carles Collet, PA-C [facility] 01/14/2018, 12:47 PM

## 2018-01-14 ENCOUNTER — Telehealth: Payer: Self-pay | Admitting: Unknown Physician Specialty

## 2018-01-14 LAB — COMPREHENSIVE METABOLIC PANEL
ALT: 11 IU/L (ref 0–32)
AST: 14 IU/L (ref 0–40)
Albumin/Globulin Ratio: 1.6 (ref 1.2–2.2)
Albumin: 4.3 g/dL (ref 3.6–4.8)
Alkaline Phosphatase: 80 IU/L (ref 39–117)
BUN/Creatinine Ratio: 14 (ref 12–28)
BUN: 13 mg/dL (ref 8–27)
Bilirubin Total: 0.3 mg/dL (ref 0.0–1.2)
CO2: 20 mmol/L (ref 20–29)
Calcium: 9.7 mg/dL (ref 8.7–10.3)
Chloride: 104 mmol/L (ref 96–106)
Creatinine, Ser: 0.91 mg/dL (ref 0.57–1.00)
GFR calc Af Amer: 78 mL/min/{1.73_m2} (ref >59)
GFR calc non Af Amer: 67 mL/min/{1.73_m2} (ref >59)
Globulin, Total: 2.7 g/dL (ref 1.5–4.5)
Glucose: 160 mg/dL — ABNORMAL HIGH (ref 65–99)
Potassium: 3.9 mmol/L (ref 3.5–5.2)
Sodium: 143 mmol/L (ref 134–144)
Total Protein: 7 g/dL (ref 6.0–8.5)

## 2018-01-14 LAB — LIPID PANEL
Chol/HDL Ratio: 3.8 ratio (ref 0.0–4.4)
Cholesterol, Total: 174 mg/dL (ref 100–199)
HDL: 46 mg/dL (ref >39)
LDL Calculated: 108 mg/dL — ABNORMAL HIGH (ref 0–99)
Triglycerides: 98 mg/dL (ref 0–149)
VLDL Cholesterol Cal: 20 mg/dL (ref 5–40)

## 2018-01-14 LAB — CBC WITH DIFFERENTIAL/PLATELET
Basophils Absolute: 0 10*3/uL (ref 0.0–0.2)
Basos: 1 %
EOS (ABSOLUTE): 0.3 10*3/uL (ref 0.0–0.4)
Eos: 5 %
Hematocrit: 35.6 % (ref 34.0–46.6)
Hemoglobin: 11.3 g/dL (ref 11.1–15.9)
Immature Grans (Abs): 0 10*3/uL (ref 0.0–0.1)
Immature Granulocytes: 0 %
Lymphocytes Absolute: 1.3 10*3/uL (ref 0.7–3.1)
Lymphs: 24 %
MCH: 25.5 pg — ABNORMAL LOW (ref 26.6–33.0)
MCHC: 31.7 g/dL (ref 31.5–35.7)
MCV: 80 fL (ref 79–97)
Monocytes Absolute: 0.4 10*3/uL (ref 0.1–0.9)
Monocytes: 8 %
Neutrophils Absolute: 3.4 10*3/uL (ref 1.4–7.0)
Neutrophils: 62 %
Platelets: 425 10*3/uL (ref 150–450)
RBC: 4.43 x10E6/uL (ref 3.77–5.28)
RDW: 14.5 % (ref 12.3–15.4)
WBC: 5.5 10*3/uL (ref 3.4–10.8)

## 2018-01-14 LAB — HEMOGLOBIN A1C
Est. average glucose Bld gHb Est-mCnc: 134 mg/dL
Hgb A1c MFr Bld: 6.3 % — ABNORMAL HIGH (ref 4.8–5.6)

## 2018-01-14 MED ORDER — ATORVASTATIN CALCIUM 10 MG PO TABS: 10.0000 mg | ORAL_TABLET | Freq: Every day | ORAL | 0 refills | Status: DC

## 2018-01-14 NOTE — Telephone Encounter (Signed)
Routing to provider  

## 2018-01-14 NOTE — Telephone Encounter (Signed)
Copied from [place] [id number]. Topic: Quick Communication - Rx Refill/Question >> Jan 14, 2018 12:50 PM Judyann Munson wrote: Medication: Potassium   Has the patient contacted their pharmacy? Yes   Preferred Pharmacy (with phone number or street name):[place], [place] - [address] [place] [phone] (Phone) [phone] (Fax)    Patient was seen on 01-13-18 and stated she was advise a medication for potassium was going to be called in . Please advise

## 2018-01-14 NOTE — Addendum Note (Signed)
Addended by: Trinna Post on: 01/14/2018 12:49 PM   Modules accepted: Orders

## 2018-01-15 DIAGNOSIS — Z6841 Body Mass Index (BMI) 40.0 and over, adult: Secondary | ICD-10-CM | POA: Diagnosis not present

## 2018-01-15 DIAGNOSIS — M1710 Unilateral primary osteoarthritis, unspecified knee: Secondary | ICD-10-CM | POA: Insufficient documentation

## 2018-01-15 DIAGNOSIS — M179 Osteoarthritis of knee, unspecified: Secondary | ICD-10-CM | POA: Insufficient documentation

## 2018-01-15 DIAGNOSIS — M1712 Unilateral primary osteoarthritis, left knee: Secondary | ICD-10-CM | POA: Diagnosis not present

## 2018-01-15 NOTE — Telephone Encounter (Signed)
Spoke to pt. Clarified to pt that she was supposed to be picking up the cholesterol medication not potassium and pt virilized understanding. Pt stated got a call from her pharmacy to pick up the RX.

## 2018-01-15 NOTE — Telephone Encounter (Signed)
I don't see this patient was ever on potassium, don't recall saying I would send this in. I sent in her cholesterol medication.

## 2018-02-10 DIAGNOSIS — E119 Type 2 diabetes mellitus without complications: Secondary | ICD-10-CM | POA: Diagnosis not present

## 2018-02-10 LAB — HM DIABETES EYE EXAM

## 2018-02-12 DIAGNOSIS — M1712 Unilateral primary osteoarthritis, left knee: Secondary | ICD-10-CM | POA: Diagnosis not present

## 2018-03-05 ENCOUNTER — Encounter: Payer: Self-pay | Admitting: Nurse Practitioner

## 2018-03-05 ENCOUNTER — Ambulatory Visit (INDEPENDENT_AMBULATORY_CARE_PROVIDER_SITE_OTHER): Payer: Medicare Other | Admitting: Nurse Practitioner

## 2018-03-05 VITALS — BP 144/82 | HR 73 | Temp 98.8°F | Wt 246.8 lb

## 2018-03-05 DIAGNOSIS — E785 Hyperlipidemia, unspecified: Secondary | ICD-10-CM | POA: Diagnosis not present

## 2018-03-05 DIAGNOSIS — I10 Essential (primary) hypertension: Secondary | ICD-10-CM

## 2018-03-05 DIAGNOSIS — Z6841 Body Mass Index (BMI) 40.0 and over, adult: Secondary | ICD-10-CM

## 2018-03-05 DIAGNOSIS — E1169 Type 2 diabetes mellitus with other specified complication: Secondary | ICD-10-CM | POA: Diagnosis not present

## 2018-03-05 DIAGNOSIS — E119 Type 2 diabetes mellitus without complications: Secondary | ICD-10-CM | POA: Diagnosis not present

## 2018-03-05 MED ORDER — AMLODIPINE BESYLATE 10 MG PO TABS: 10.0000 mg | ORAL_TABLET | Freq: Every day | ORAL | 3 refills | Status: DC

## 2018-03-05 MED ORDER — CLONIDINE HCL 0.3 MG PO TABS: 0.3000 mg | ORAL_TABLET | Freq: Two times a day (BID) | ORAL | 3 refills | Status: DC

## 2018-03-05 MED ORDER — HYDROCHLOROTHIAZIDE 25 MG PO TABS: 12.5000 mg | ORAL_TABLET | Freq: Every day | ORAL | 3 refills | Status: DC

## 2018-03-05 MED ORDER — MELOXICAM 15 MG PO TABS: 15.0000 mg | ORAL_TABLET | Freq: Every day | ORAL | 2 refills | Status: DC

## 2018-03-05 MED ORDER — LISINOPRIL 10 MG PO TABS: 10.0000 mg | ORAL_TABLET | Freq: Every day | ORAL | 3 refills | Status: DC

## 2018-03-05 MED ORDER — ATORVASTATIN CALCIUM 10 MG PO TABS: 10.0000 mg | ORAL_TABLET | Freq: Every day | ORAL | 3 refills | Status: DC

## 2018-03-05 MED ORDER — METFORMIN HCL 500 MG PO TABS: ORAL_TABLET | ORAL | 3 refills | Status: DC

## 2018-03-05 NOTE — Patient Instructions (Signed)
Try taking either Magnesium supplement daily, Coenzyme Q10, or eating foods rich in potassium.   Fat and Cholesterol Restricted Diet Getting too much fat and cholesterol in your diet may cause health problems. Following this diet helps keep your fat and cholesterol at normal levels. This can keep you from getting sick. What types of fat should I choose?  Choose monosaturated and polyunsaturated fats. These are found in foods such as olive oil, canola oil, flaxseeds, walnuts, almonds, and seeds.  Eat more omega-3 fats. Good choices include salmon, mackerel, sardines, tuna, flaxseed oil, and ground flaxseeds.  Limit saturated fats. These are in animal products such as meats, butter, and cream. They can also be in plant products such as palm oil, palm kernel oil, and coconut oil.  Avoid foods with partially hydrogenated oils in them. These contain trans fats. Examples of foods that have trans fats are stick margarine, some tub margarines, cookies, crackers, and other baked goods. What general guidelines do I need to follow?  Check food labels. Look for the words "trans fat" and "saturated fat."  When preparing a meal: ? Fill half of your plate with vegetables and green salads. ? Fill one fourth of your plate with whole grains. Look for the word "whole" as the first word in the ingredient list. ? Fill one fourth of your plate with lean protein foods.  Eat more foods that have fiber, like apples, carrots, beans, peas, and barley.  Eat more home-cooked foods. Eat less at restaurants and buffets.  Limit or avoid alcohol.  Limit foods high in starch and sugar.  Limit fried foods.  Cook foods without frying them. Baking, boiling, grilling, and broiling are all great options.  Lose weight if you are overweight. Losing even a small amount of weight can help your overall health. It can also help prevent diseases such as diabetes and heart disease. What foods can I eat? Grains Whole grains,  such as whole wheat or whole grain breads, crackers, cereals, and pasta. Unsweetened oatmeal, bulgur, barley, quinoa, or brown rice. Corn or whole wheat flour tortillas. Vegetables Fresh or frozen vegetables (raw, steamed, roasted, or grilled). Green salads. Fruits All fresh, canned (in natural juice), or frozen fruits. Meat and Other Protein Products Ground beef (85% or leaner), grass-fed beef, or beef trimmed of fat. Skinless chicken or [place]. Ground chicken or [place]. Pork trimmed of fat. All fish and seafood. Eggs. Dried beans, peas, or lentils. Unsalted nuts or seeds. Unsalted canned or dry beans. Dairy Low-fat dairy products, such as skim or 1% milk, 2% or reduced-fat cheeses, low-fat ricotta or cottage cheese, or plain low-fat yogurt. Fats and Oils Tub margarines without trans fats. Light or reduced-fat mayonnaise and salad dressings. Avocado. Olive, canola, sesame, or safflower oils. Natural peanut or almond butter (choose ones without added sugar and oil). The items listed above may not be a complete list of recommended foods or beverages. Contact your dietitian for more options. What foods are not recommended? Grains White bread. White pasta. White rice. Cornbread. Bagels, pastries, and croissants. Crackers that contain trans fat. Vegetables White potatoes. Corn. Creamed or fried vegetables. Vegetables in a cheese sauce. Fruits Dried fruits. Canned fruit in light or heavy syrup. Fruit juice. Meat and Other Protein Products Fatty cuts of meat. Ribs, chicken wings, bacon, sausage, bologna, salami, chitterlings, fatback, hot dogs, bratwurst, and packaged luncheon meats. Liver and organ meats. Dairy Whole or 2% milk, cream, half-and-half, and cream cheese. Whole milk cheeses. Whole-fat or sweetened yogurt. Full-fat cheeses.  Nondairy creamers and whipped toppings. Processed cheese, cheese spreads, or cheese curds. Sweets and Desserts Corn syrup, sugars, honey, and molasses. Candy. Jam  and jelly. Syrup. Sweetened cereals. Cookies, pies, cakes, donuts, muffins, and ice cream. Fats and Oils Butter, stick margarine, lard, shortening, ghee, or bacon fat. Coconut, palm kernel, or palm oils. Beverages Alcohol. Sweetened drinks (such as sodas, lemonade, and fruit drinks or punches). The items listed above may not be a complete list of foods and beverages to avoid. Contact your dietitian for more information. This information is not intended to replace advice given to you by your health care provider. Make sure you discuss any questions you have with your health care provider. Document Released: 10/08/2011 Document Revised: 12/14/2015 Document Reviewed: 07/08/2013 Elsevier Interactive Patient Education  [employer].

## 2018-03-05 NOTE — Assessment & Plan Note (Addendum)
Started on Atorvastatin in September 2019.  No ADR reported.  Lipid panel today.

## 2018-03-05 NOTE — Progress Notes (Signed)
BP (!) 144/82 (BP Location: Left Arm, Patient Position: Sitting)   Pulse 73   Temp 98.8 F (37.1 C) (Oral)   Wt 246 lb 12.8 oz (111.9 kg)   SpO2 97%   BMI 51.58 kg/m    Subjective:    Patient ID: Jillian Pierce, female    DOB: 09-01-1955, 62 y.o.   MRN: [id number]  HPI: Jillian Pierce is a 62 y.o. female present for HLD check.  Chief Complaint  Patient presents with  . Hyperlipidemia    6 week f/up   HYPERLIPIDEMIA Started on Lipitor at last visit in September.  Reports no ADR. Hyperlipidemia status: good compliance Satisfied with current treatment?  yes Side effects:  no Medication compliance: good compliance Past cholesterol meds: none Supplements: none Aspirin:  yes The 10-year ASCVD risk score Mikey Bussing DC Jr., et al., 2013) is: 23%   Values used to calculate the score:     Age: 68 years     Sex: Female     Is Non-Hispanic African American: Yes     Diabetic: Yes     Tobacco smoker: No     Systolic Blood Pressure: 371 mmHg     Is BP treated: Yes     HDL Cholesterol: 46 mg/dL     Total Cholesterol: 174 mg/dL Chest pain:  no Coronary artery disease:  no Family history CAD:  no Family history early CAD:  no  Relevant past medical, surgical, family and social history reviewed and updated as indicated. Interim medical history since our last visit reviewed. Allergies and medications reviewed and updated.  Review of Systems  Constitutional: Negative for activity change, appetite change, fatigue and fever.  Respiratory: Negative for cough, chest tightness, shortness of breath and wheezing.   Cardiovascular: Negative for chest pain, palpitations and leg swelling.  Gastrointestinal: Negative for abdominal distention, abdominal pain, constipation, diarrhea, nausea and vomiting.  Endocrine: Negative for cold intolerance, heat intolerance, polydipsia, polyphagia and polyuria.  Musculoskeletal: Negative for arthralgias, myalgias and neck pain.  Skin: Negative.   Neurological:  Negative for dizziness, syncope, weakness, light-headedness, numbness and headaches.  Psychiatric/Behavioral: Negative.     Per HPI unless specifically indicated above     Objective:    BP (!) 144/82 (BP Location: Left Arm, Patient Position: Sitting)   Pulse 73   Temp 98.8 F (37.1 C) (Oral)   Wt 246 lb 12.8 oz (111.9 kg)   SpO2 97%   BMI 51.58 kg/m   Wt Readings from Last 3 Encounters:  03/05/18 246 lb 12.8 oz (111.9 kg)  01/13/18 245 lb 3.2 oz (111.2 kg)  10/09/17 245 lb 3.2 oz (111.2 kg)    Physical Exam  Constitutional: She is oriented to person, place, and time. She appears well-developed and well-nourished.  HENT:  Head: Normocephalic and atraumatic.  Eyes: Pupils are equal, round, and reactive to light. Conjunctivae and EOM are normal. Right eye exhibits no discharge. Left eye exhibits no discharge.  Neck: Normal range of motion. Neck supple. No JVD present. Carotid bruit is not present. No thyromegaly present.  Cardiovascular: Normal rate, regular rhythm and normal heart sounds. Exam reveals no gallop.  No murmur heard. Pulmonary/Chest: Effort normal and breath sounds normal. She has no wheezes.  Abdominal: Soft. Bowel sounds are normal.  Obese abdomen  Musculoskeletal: Normal range of motion.  Lymphadenopathy:    She has no cervical adenopathy.  Neurological: She is alert and oriented to person, place, and time.  Skin: Skin is warm and dry.  Psychiatric: She has a normal mood and affect. Her behavior is normal. Judgment and thought content normal.    Results for orders placed or performed in visit on 02/12/18  HM DIABETES EYE EXAM  Result Value Ref Range   HM Diabetic Eye Exam No Retinopathy No Retinopathy      Assessment & Plan:   Problem List Items Addressed This Visit      Cardiovascular and Mediastinum   Hypertension   Relevant Medications   cloNIDine (CATAPRES) 0.3 MG tablet   amLODipine (NORVASC) 10 MG tablet   hydrochlorothiazide (HYDRODIURIL)  25 MG tablet   lisinopril (PRINIVIL,ZESTRIL) 10 MG tablet   atorvastatin (LIPITOR) 10 MG tablet     Endocrine   Hyperlipidemia associated with type 2 diabetes mellitus ([place]) - Primary    Started on Atorvastatin in September 2019.  No ADR reported.  Lipid panel today.      Relevant Medications   lisinopril (PRINIVIL,ZESTRIL) 10 MG tablet   metFORMIN (GLUCOPHAGE) 500 MG tablet   atorvastatin (LIPITOR) 10 MG tablet   Other Relevant Orders   Lipid Panel w/o Chol/HDL Ratio    Other Visit Diagnoses    Controlled type 2 diabetes mellitus without complication, without long-term current use of insulin (HCC)       Relevant Medications   lisinopril (PRINIVIL,ZESTRIL) 10 MG tablet   metFORMIN (GLUCOPHAGE) 500 MG tablet   atorvastatin (LIPITOR) 10 MG tablet   Class 3 severe obesity due to excess calories with body mass index (BMI) of 50.0 to 59.9 in adult, unspecified whether serious comorbidity present (HCC)       Relevant Medications   amLODipine (NORVASC) 10 MG tablet   hydrochlorothiazide (HYDRODIURIL) 25 MG tablet   metFORMIN (GLUCOPHAGE) 500 MG tablet       Follow up plan: Return in about 1 month (around 04/04/2018) for T2DM, HTN/HLD.

## 2018-03-06 LAB — LIPID PANEL W/O CHOL/HDL RATIO
Cholesterol, Total: 136 mg/dL (ref 100–199)
HDL: 60 mg/dL (ref >39)
LDL Calculated: 63 mg/dL (ref 0–99)
TRIGLYCERIDES: 65 mg/dL (ref 0–149)
VLDL Cholesterol Cal: 13 mg/dL (ref 5–40)

## 2018-04-03 ENCOUNTER — Ambulatory Visit (INDEPENDENT_AMBULATORY_CARE_PROVIDER_SITE_OTHER): Payer: Medicare Other | Admitting: Nurse Practitioner

## 2018-04-03 ENCOUNTER — Encounter: Payer: Self-pay | Admitting: Nurse Practitioner

## 2018-04-03 VITALS — BP 131/82 | HR 69 | Temp 98.0°F | Ht 59.0 in | Wt 246.7 lb

## 2018-04-03 DIAGNOSIS — E1169 Type 2 diabetes mellitus with other specified complication: Secondary | ICD-10-CM

## 2018-04-03 DIAGNOSIS — J309 Allergic rhinitis, unspecified: Secondary | ICD-10-CM | POA: Insufficient documentation

## 2018-04-03 DIAGNOSIS — E1140 Type 2 diabetes mellitus with diabetic neuropathy, unspecified: Secondary | ICD-10-CM | POA: Diagnosis not present

## 2018-04-03 DIAGNOSIS — J301 Allergic rhinitis due to pollen: Secondary | ICD-10-CM

## 2018-04-03 DIAGNOSIS — E785 Hyperlipidemia, unspecified: Secondary | ICD-10-CM

## 2018-04-03 DIAGNOSIS — I10 Essential (primary) hypertension: Secondary | ICD-10-CM

## 2018-04-03 LAB — LP+ALT+AST PICCOLO, WAIVED
ALT (SGPT) Piccolo, Waived: 19 U/L (ref 10–47)
AST (SGOT) PICCOLO, WAIVED: 19 U/L (ref 11–38)
Chol/HDL Ratio Piccolo,Waive: 2.1 mg/dL
Cholesterol Piccolo, Waived: 114 mg/dL (ref <200)
HDL Chol Piccolo, Waived: 53 mg/dL — ABNORMAL LOW (ref >59)
LDL Chol Calc Piccolo Waived: 48 mg/dL (ref <100)
Triglycerides Piccolo,Waived: 60 mg/dL (ref <150)
VLDL CHOL CALC PICCOLO,WAIVE: 12 mg/dL (ref <30)

## 2018-04-03 LAB — BAYER DCA HB A1C WAIVED: HB A1C: 6.2 % (ref <7.0)

## 2018-04-03 MED ORDER — FLUTICASONE PROPIONATE 50 MCG/ACT NA SUSP: 2.0000 | Freq: Every day | NASAL | 6 refills | Status: DC

## 2018-04-03 MED ORDER — LORATADINE 10 MG PO TABS: 10.0000 mg | ORAL_TABLET | Freq: Every day | ORAL | 3 refills | Status: DC

## 2018-04-03 NOTE — Assessment & Plan Note (Signed)
Chronic, improved TCHOL and LDL with current regimen.  Continue Atorvastatin.

## 2018-04-03 NOTE — Assessment & Plan Note (Signed)
Seasonal.  Script sent for Flonase and Claritin.  Return for worsening or continued issues.

## 2018-04-03 NOTE — Assessment & Plan Note (Signed)
Chronic, stable on current regimen.  A1C today 6.2%.  Continue Metformin.

## 2018-04-03 NOTE — Progress Notes (Signed)
BP 131/82 (BP Location: Left Arm, Patient Position: Sitting, Cuff Size: Normal)   Pulse 69   Temp 98 F (36.7 C) (Oral)   Ht 4\' 11"  (1.499 m)   Wt 246 lb 11.2 oz (111.9 kg)   SpO2 96%   BMI 49.83 kg/m    Subjective:    Patient ID: Jillian Pierce, female    DOB: Oct 12, 1955, 62 y.o.   MRN: [id number]  HPI: Jillian Pierce is a 62 y.o. female presents for T2DM, HTN/HLD f/u  Chief Complaint  Patient presents with  . Diabetes  . Hypertension  . Hyperlipidemia   DIABETES Continues on Metformin without ADR.  Previous A1C 5.8% and today 6.2%.  She continues to focus on diet and exercise regimen at home, as she wishes to have knee surgery in new year. Hypoglycemic episodes:no Polydipsia/polyuria: no Visual disturbance: no Chest pain: no Paresthesias: no Glucose Monitoring: no  Accucheck frequency: Not Checking  Fasting glucose:  Post prandial:  Evening:  Before meals: Taking Insulin?: no  Long acting insulin:  Short acting insulin: Blood Pressure Monitoring: not checking Retinal Examination: Up to Date Foot Exam: Up to Date Pneumovax: Up to Date Influenza: Up to Date Aspirin: yes   HYPERTENSION / HYPERLIPIDEMIA Continues on Atorvastatin with labs below goal and no ADR reported.  Lisinopril, HCTZ, Clonidine, and Amlodipine for BP without ADR. Satisfied with current treatment? yes Duration of hypertension: chronic BP monitoring frequency: not checking BP range:  BP medication side effects: no Duration of hyperlipidemia: chronic Cholesterol medication side effects: no Cholesterol supplements: none Medication compliance: good compliance Aspirin: yes Recent stressors: no Recurrent headaches: no Visual changes: no Palpitations: no Dyspnea: no Chest pain: no Lower extremity edema: no Dizzy/lightheaded: no  ALLERGIES Reports some allergic rhinorrhea and sinus issues.  Discussed medications that can be utilized with patient. Duration: months Runny nose: yes  "clear Nasal congestion: no Nasal itching: yes Sneezing: yes Eye swelling, itching or discharge: no Post nasal drip: yes Cough: no Sinus pressure: no  Ear pain: none Ear pressure: none Fever: none Symptoms occur seasonally: yes Symptoms occur perenially: no Satisfied with current treatment: none at this time Allergist evaluation in past: no Allergen injection immunotherapy: no Recurrent sinus infections: no ENT evaluation in past: no Known environmental allergy: no Indoor pets: no History of asthma: no Current allergy medications: none Treatments attempted: none  Relevant past medical, surgical, family and social history reviewed and updated as indicated. Interim medical history since our last visit reviewed. Allergies and medications reviewed and updated.  Review of Systems  Constitutional: Negative for activity change, appetite change, fatigue and fever.  HENT: Positive for postnasal drip, rhinorrhea and sneezing. Negative for congestion, sinus pressure and sinus pain.   Respiratory: Negative for cough, chest tightness and shortness of breath.   Cardiovascular: Negative for chest pain, palpitations and leg swelling.  Gastrointestinal: Negative for abdominal distention, abdominal pain, constipation, diarrhea, nausea and vomiting.  Neurological: Negative for dizziness, numbness and headaches.  Psychiatric/Behavioral: Negative.     Per HPI unless specifically indicated above     Objective:    BP 131/82 (BP Location: Left Arm, Patient Position: Sitting, Cuff Size: Normal)   Pulse 69   Temp 98 F (36.7 C) (Oral)   Ht 4\' 11"  (1.499 m)   Wt 246 lb 11.2 oz (111.9 kg)   SpO2 96%   BMI 49.83 kg/m   Wt Readings from Last 3 Encounters:  04/03/18 246 lb 11.2 oz (111.9 kg)  03/05/18 246 lb  12.8 oz (111.9 kg)  01/13/18 245 lb 3.2 oz (111.2 kg)    Physical Exam Vitals signs and nursing note reviewed.  Constitutional:      Appearance: She is well-developed.  HENT:      Head: Normocephalic.     Right Ear: Tympanic membrane, ear canal and external ear normal.     Left Ear: Tympanic membrane, ear canal and external ear normal.     Nose: Rhinorrhea present. No mucosal edema.     Comments: Pale turbinates    Mouth/Throat:     Mouth: Mucous membranes are moist.     Pharynx: Oropharynx is clear.  Eyes:     General:        Right eye: No discharge.        Left eye: No discharge.     Conjunctiva/sclera: Conjunctivae normal.     Pupils: Pupils are equal, round, and reactive to light.  Neck:     Musculoskeletal: Normal range of motion and neck supple.     Thyroid: No thyromegaly.     Vascular: No carotid bruit or JVD.  Cardiovascular:     Rate and Rhythm: Normal rate and regular rhythm.     Heart sounds: Normal heart sounds.  Pulmonary:     Effort: Pulmonary effort is normal.     Breath sounds: Normal breath sounds.  Abdominal:     General: Bowel sounds are normal.     Palpations: Abdomen is soft.  Lymphadenopathy:     Cervical: No cervical adenopathy.  Skin:    General: Skin is warm and dry.  Neurological:     Mental Status: She is alert and oriented to person, place, and time.  Psychiatric:        Mood and Affect: Mood normal.        Behavior: Behavior normal.        Thought Content: Thought content normal.        Judgment: Judgment normal.     Results for orders placed or performed in visit on 03/05/18  Lipid Panel w/o Chol/HDL Ratio  Result Value Ref Range   Cholesterol, Total 136 100 - 199 mg/dL   Triglycerides 65 0 - 149 mg/dL   HDL 60 >39 mg/dL   VLDL Cholesterol Cal 13 5 - 40 mg/dL   LDL Calculated 63 0 - 99 mg/dL      Assessment & Plan:   Problem List Items Addressed This Visit      Cardiovascular and Mediastinum   Hypertension    Chronic, stable.  Continue current medication regimen.  BP at goal today 131/82.        Relevant Orders   Bayer DCA Hb A1c Waived   LP+ALT+AST Piccolo, Waived   Comprehensive metabolic panel      Respiratory   Allergic rhinitis    Seasonal.  Script sent for Flonase and Claritin.  Return for worsening or continued issues.        Endocrine   Type 2 diabetes mellitus with diabetic neuropathy, unspecified (HCC) - Primary    Chronic, stable on current regimen.  A1C today 6.2%.  Continue Metformin.      Hyperlipidemia associated with type 2 diabetes mellitus (HCC)    Chronic, improved TCHOL and LDL with current regimen.  Continue Atorvastatin.        Relevant Orders   Bayer DCA Hb A1c Waived   LP+ALT+AST Piccolo, Waived       Follow up plan: Return in about 3 months (around 07/03/2018)  for T2DM, HTN/HLD.

## 2018-04-03 NOTE — Patient Instructions (Signed)
Diabetes Mellitus and Nutrition When you have diabetes (diabetes mellitus), it is very important to have healthy eating habits because your blood sugar (glucose) levels are greatly affected by what you eat and drink. Eating healthy foods in the appropriate amounts, at about the same times every day, can help you:  Control your blood glucose.  Lower your risk of heart disease.  Improve your blood pressure.  Reach or maintain a healthy weight.  Every person with diabetes is different, and each person has different needs for a meal plan. Your health care provider may recommend that you work with a diet and nutrition specialist (dietitian) to make a meal plan that is best for you. Your meal plan may vary depending on factors such as:  The calories you need.  The medicines you take.  Your weight.  Your blood glucose, blood pressure, and cholesterol levels.  Your activity level.  Other health conditions you have, such as heart or kidney disease.  How do carbohydrates affect me? Carbohydrates affect your blood glucose level more than any other type of food. Eating carbohydrates naturally increases the amount of glucose in your blood. Carbohydrate counting is a method for keeping track of how many carbohydrates you eat. Counting carbohydrates is important to keep your blood glucose at a healthy level, especially if you use insulin or take certain oral diabetes medicines. It is important to know how many carbohydrates you can safely have in each meal. This is different for every person. Your dietitian can help you calculate how many carbohydrates you should have at each meal and for snack. Foods that contain carbohydrates include:  Bread, cereal, rice, pasta, and crackers.  Potatoes and corn.  Peas, beans, and lentils.  Milk and yogurt.  Fruit and juice.  Desserts, such as cakes, cookies, ice cream, and candy.  How does alcohol affect me? Alcohol can cause a sudden decrease in blood  glucose (hypoglycemia), especially if you use insulin or take certain oral diabetes medicines. Hypoglycemia can be a life-threatening condition. Symptoms of hypoglycemia (sleepiness, dizziness, and confusion) are similar to symptoms of having too much alcohol. If your health care provider says that alcohol is safe for you, follow these guidelines:  Limit alcohol intake to no more than 1 drink per day for nonpregnant women and 2 drinks per day for men. One drink equals 12 oz of beer, 5 oz of wine, or 1 oz of hard liquor.  Do not drink on an empty stomach.  Keep yourself hydrated with water, diet soda, or unsweetened iced tea.  Keep in mind that regular soda, juice, and other mixers may contain a lot of sugar and must be counted as carbohydrates.  What are tips for following this plan? Reading food labels  Start by checking the serving size on the label. The amount of calories, carbohydrates, fats, and other nutrients listed on the label are based on one serving of the food. Many foods contain more than one serving per package.  Check the total grams (g) of carbohydrates in one serving. You can calculate the number of servings of carbohydrates in one serving by dividing the total carbohydrates by 15. For example, if a food has 30 g of total carbohydrates, it would be equal to 2 servings of carbohydrates.  Check the number of grams (g) of saturated and trans fats in one serving. Choose foods that have low or no amount of these fats.  Check the number of milligrams (mg) of sodium in one serving. Most people   should limit total sodium intake to less than 2,300 mg per day.  Always check the nutrition information of foods labeled as "low-fat" or "nonfat". These foods may be higher in added sugar or refined carbohydrates and should be avoided.  Talk to your dietitian to identify your daily goals for nutrients listed on the label. Shopping  Avoid buying canned, premade, or processed foods. These  foods tend to be high in fat, sodium, and added sugar.  Shop around the outside edge of the grocery store. This includes fresh fruits and vegetables, bulk grains, fresh meats, and fresh dairy. Cooking  Use low-heat cooking methods, such as baking, instead of high-heat cooking methods like deep frying.  Cook using healthy oils, such as olive, canola, or sunflower oil.  Avoid cooking with butter, cream, or high-fat meats. Meal planning  Eat meals and snacks regularly, preferably at the same times every day. Avoid going long periods of time without eating.  Eat foods high in fiber, such as fresh fruits, vegetables, beans, and whole grains. Talk to your dietitian about how many servings of carbohydrates you can eat at each meal.  Eat 4-6 ounces of lean protein each day, such as lean meat, chicken, fish, eggs, or tofu. 1 ounce is equal to 1 ounce of meat, chicken, or fish, 1 egg, or 1/4 cup of tofu.  Eat some foods each day that contain healthy fats, such as avocado, nuts, seeds, and fish. Lifestyle   Check your blood glucose regularly.  Exercise at least 30 minutes 5 or more days each week, or as told by your health care provider.  Take medicines as told by your health care provider.  Do not use any products that contain nicotine or tobacco, such as cigarettes and e-cigarettes. If you need help quitting, ask your health care provider.  Work with a counselor or diabetes educator to identify strategies to manage stress and any emotional and social challenges. What are some questions to ask my health care provider?  Do I need to meet with a diabetes educator?  Do I need to meet with a dietitian?  What number can I call if I have questions?  When are the best times to check my blood glucose? Where to find more information:  American Diabetes Association: diabetes.org/food-and-fitness/food  Academy of Nutrition and Dietetics:  www.eatright.org/resources/health/diseases-and-conditions/diabetes  National Institute of Diabetes and Digestive and Kidney Diseases (NIH): www.niddk.nih.gov/health-information/diabetes/overview/diet-eating-physical-activity Summary  A healthy meal plan will help you control your blood glucose and maintain a healthy lifestyle.  Working with a diet and nutrition specialist (dietitian) can help you make a meal plan that is best for you.  Keep in mind that carbohydrates and alcohol have immediate effects on your blood glucose levels. It is important to count carbohydrates and to use alcohol carefully. This information is not intended to replace advice given to you by your health care provider. Make sure you discuss any questions you have with your health care provider. Document Released: 01/03/2005 Document Revised: 05/13/2016 Document Reviewed: 05/13/2016 Elsevier Interactive Patient Education  2018 [employer] Inc.  

## 2018-04-03 NOTE — Assessment & Plan Note (Signed)
Chronic, stable.  Continue current medication regimen.  BP at goal today 131/82.

## 2018-04-04 LAB — COMPREHENSIVE METABOLIC PANEL
A/G RATIO: 1.6 (ref 1.2–2.2)
ALT: 12 IU/L (ref 0–32)
AST: 15 IU/L (ref 0–40)
Albumin: 4.2 g/dL (ref 3.6–4.8)
Alkaline Phosphatase: 74 IU/L (ref 39–117)
BILIRUBIN TOTAL: 0.3 mg/dL (ref 0.0–1.2)
BUN/Creatinine Ratio: 18 (ref 12–28)
BUN: 17 mg/dL (ref 8–27)
CHLORIDE: 104 mmol/L (ref 96–106)
CO2: 23 mmol/L (ref 20–29)
Calcium: 9.7 mg/dL (ref 8.7–10.3)
Creatinine, Ser: 0.96 mg/dL (ref 0.57–1.00)
GFR calc Af Amer: 73 mL/min/{1.73_m2} (ref >59)
GFR calc non Af Amer: 63 mL/min/{1.73_m2} (ref >59)
GLOBULIN, TOTAL: 2.7 g/dL (ref 1.5–4.5)
Glucose: 141 mg/dL — ABNORMAL HIGH (ref 65–99)
POTASSIUM: 3.7 mmol/L (ref 3.5–5.2)
SODIUM: 144 mmol/L (ref 134–144)
Total Protein: 6.9 g/dL (ref 6.0–8.5)

## 2018-04-07 ENCOUNTER — Ambulatory Visit: Payer: Medicare Other | Admitting: Family Medicine

## 2018-07-02 ENCOUNTER — Encounter: Payer: Self-pay | Admitting: Nurse Practitioner

## 2018-07-02 DIAGNOSIS — Z6841 Body Mass Index (BMI) 40.0 and over, adult: Secondary | ICD-10-CM | POA: Insufficient documentation

## 2018-07-03 ENCOUNTER — Ambulatory Visit (INDEPENDENT_AMBULATORY_CARE_PROVIDER_SITE_OTHER): Payer: Medicare Other | Admitting: Nurse Practitioner

## 2018-07-03 ENCOUNTER — Other Ambulatory Visit: Payer: Self-pay

## 2018-07-03 ENCOUNTER — Encounter: Payer: Self-pay | Admitting: Nurse Practitioner

## 2018-07-03 VITALS — BP 132/80 | HR 59 | Temp 98.4°F | Ht 59.0 in | Wt 255.0 lb

## 2018-07-03 DIAGNOSIS — Z1211 Encounter for screening for malignant neoplasm of colon: Secondary | ICD-10-CM

## 2018-07-03 DIAGNOSIS — E1169 Type 2 diabetes mellitus with other specified complication: Secondary | ICD-10-CM

## 2018-07-03 DIAGNOSIS — E785 Hyperlipidemia, unspecified: Secondary | ICD-10-CM

## 2018-07-03 DIAGNOSIS — E538 Deficiency of other specified B group vitamins: Secondary | ICD-10-CM

## 2018-07-03 DIAGNOSIS — I10 Essential (primary) hypertension: Secondary | ICD-10-CM

## 2018-07-03 DIAGNOSIS — E1129 Type 2 diabetes mellitus with other diabetic kidney complication: Secondary | ICD-10-CM

## 2018-07-03 DIAGNOSIS — E1140 Type 2 diabetes mellitus with diabetic neuropathy, unspecified: Secondary | ICD-10-CM

## 2018-07-03 DIAGNOSIS — Z87898 Personal history of other specified conditions: Secondary | ICD-10-CM

## 2018-07-03 DIAGNOSIS — R809 Proteinuria, unspecified: Secondary | ICD-10-CM | POA: Insufficient documentation

## 2018-07-03 LAB — BAYER DCA HB A1C WAIVED: HB A1C: 6.7 % (ref <7.0)

## 2018-07-03 LAB — MICROALBUMIN, URINE WAIVED
Creatinine, Urine Waived: 100 mg/dL (ref 10–300)
MICROALB, UR WAIVED: 150 mg/L — AB (ref 0–19)

## 2018-07-03 NOTE — Assessment & Plan Note (Signed)
Chronic, ongoing.  Continue current medication regimen.  Lipid panel next visit. 

## 2018-07-03 NOTE — Assessment & Plan Note (Signed)
Recommend continued focus on health diet choices and regular physical activity (30 minutes 5 days a week).  Discussed goal of loss of 1 pound per week over the next two months.

## 2018-07-03 NOTE — Progress Notes (Signed)
BP 132/80 (BP Location: Left Arm, Patient Position: Sitting, Cuff Size: Large)   Pulse (!) 59   Temp 98.4 F (36.9 C) (Oral)   Ht 4\' 11"  (1.499 m)   Wt 255 lb (115.7 kg)   SpO2 96%   BMI 51.50 kg/m    Subjective:    Patient ID: Jillian Pierce, female    DOB: 18-Mar-1956, 63 y.o.   MRN: [id number]  HPI: Jillian Pierce is a 63 y.o. female  Chief Complaint  Patient presents with  . Hypertension  . Hyperlipidemia  . Diabetes   HYPERTENSION / HYPERLIPIDEMIA Continues on Lipitor for HLD and Norvasc, Lisinopril, and HCTZ for HTN. Satisfied with current treatment? yes Duration of hypertension: chronic BP monitoring frequency: not checking BP range:  BP medication side effects: no Duration of hyperlipidemia: chronic Cholesterol medication side effects: no Cholesterol supplements: none Medication compliance: good compliance Aspirin: yes Recent stressors: no Recurrent headaches: no Visual changes: no Palpitations: no Dyspnea: no Chest pain: no Lower extremity edema: no Dizzy/lightheaded: no   DIABETES Last A1C 6.2% in December.  Continues on Metformin.  Discussed at length exercise and diet regimen.  She would like B12 level checked today reporting h/o low level in past. Hypoglycemic episodes:no Polydipsia/polyuria: no Visual disturbance: no Chest pain: no Paresthesias: no Glucose Monitoring: no  Accucheck frequency: Not Checking  Fasting glucose:  Post prandial:  Evening:  Before meals: Taking Insulin?: no  Long acting insulin:  Short acting insulin: Blood Pressure Monitoring: not checking Retinal Examination: Up to Date Foot Exam: Up to Date Influenza: Up to Date Aspirin: yes  Relevant past medical, surgical, family and social history reviewed and updated as indicated. Interim medical history since our last visit reviewed. Allergies and medications reviewed and updated.  Review of Systems  Constitutional: Negative for activity change, appetite change,  diaphoresis, fatigue and fever.  Respiratory: Negative for cough, chest tightness and shortness of breath.   Cardiovascular: Negative for chest pain, palpitations and leg swelling.  Gastrointestinal: Negative for abdominal distention, abdominal pain, constipation, diarrhea, nausea and vomiting.  Endocrine: Negative for cold intolerance, heat intolerance, polydipsia, polyphagia and polyuria.  Neurological: Negative for dizziness, syncope, weakness, light-headedness, numbness and headaches.  Psychiatric/Behavioral: Negative.     Per HPI unless specifically indicated above     Objective:    BP 132/80 (BP Location: Left Arm, Patient Position: Sitting, Cuff Size: Large)   Pulse (!) 59   Temp 98.4 F (36.9 C) (Oral)   Ht 4\' 11"  (1.499 m)   Wt 255 lb (115.7 kg)   SpO2 96%   BMI 51.50 kg/m   Wt Readings from Last 3 Encounters:  07/03/18 255 lb (115.7 kg)  04/03/18 246 lb 11.2 oz (111.9 kg)  03/05/18 246 lb 12.8 oz (111.9 kg)    Physical Exam Vitals signs and nursing note reviewed.  Constitutional:      General: She is awake.     Appearance: She is well-developed. She is morbidly obese.  HENT:     Head: Normocephalic.  Eyes:     General:        Right eye: No discharge.        Left eye: No discharge.     Conjunctiva/sclera: Conjunctivae normal.     Pupils: Pupils are equal, round, and reactive to light.  Neck:     Musculoskeletal: Normal range of motion and neck supple.     Thyroid: No thyromegaly.     Vascular: No carotid bruit or JVD.  Cardiovascular:     Rate and Rhythm: Normal rate and regular rhythm.     Heart sounds: Normal heart sounds. No murmur. No gallop.   Pulmonary:     Effort: Pulmonary effort is normal.     Breath sounds: Normal breath sounds.  Abdominal:     General: Bowel sounds are normal.     Palpations: Abdomen is soft.  Musculoskeletal:     Right lower leg: No edema.     Left lower leg: No edema.  Lymphadenopathy:     Cervical: No cervical  adenopathy.  Skin:    General: Skin is warm and dry.  Neurological:     Mental Status: She is alert and oriented to person, place, and time.  Psychiatric:        Attention and Perception: Attention normal.        Mood and Affect: Mood normal.        Behavior: Behavior normal. Behavior is cooperative.        Thought Content: Thought content normal.        Judgment: Judgment normal.     Results for orders placed or performed in visit on 04/03/18  Bayer DCA Hb A1c Waived  Result Value Ref Range   HB A1C (BAYER DCA - WAIVED) 6.2 <7.0 %  LP+ALT+AST Piccolo, Waived  Result Value Ref Range   ALT (SGPT) Piccolo, Waived 19 10 - 47 U/L   AST (SGOT) Piccolo, Waived 19 11 - 38 U/L   Cholesterol Piccolo, Waived 114 <200 mg/dL   HDL Chol Piccolo, Waived 53 (L) >59 mg/dL   Triglycerides Piccolo,Waived 60 <150 mg/dL   Chol/HDL Ratio Piccolo,Waive 2.1 mg/dL   LDL Chol Calc Piccolo Waived 48 <100 mg/dL   VLDL Chol Calc Piccolo,Waive 12 <30 mg/dL  Comprehensive metabolic panel  Result Value Ref Range   Glucose 141 (H) 65 - 99 mg/dL   BUN 17 8 - 27 mg/dL   Creatinine, Ser 0.96 0.57 - 1.00 mg/dL   GFR calc non Af Amer 63 >59 mL/min/1.73   GFR calc Af Amer 73 >59 mL/min/1.73   BUN/Creatinine Ratio 18 12 - 28   Sodium 144 134 - 144 mmol/L   Potassium 3.7 3.5 - 5.2 mmol/L   Chloride 104 96 - 106 mmol/L   CO2 23 20 - 29 mmol/L   Calcium 9.7 8.7 - 10.3 mg/dL   Total Protein 6.9 6.0 - 8.5 g/dL   Albumin 4.2 3.6 - 4.8 g/dL   Globulin, Total 2.7 1.5 - 4.5 g/dL   Albumin/Globulin Ratio 1.6 1.2 - 2.2   Bilirubin Total 0.3 0.0 - 1.2 mg/dL   Alkaline Phosphatase 74 39 - 117 IU/L   AST 15 0 - 40 IU/L   ALT 12 0 - 32 IU/L      Assessment & Plan:   Problem List Items Addressed This Visit      Cardiovascular and Mediastinum   Hypertension    Chronic, ongoing.  Initial BP elevated, but repeat at goal.  Continue current medication regimen.          Endocrine   Type 2 diabetes mellitus with  diabetic neuropathy, unspecified (HCC) - Primary    Chronic, ongoing.  A1C 6.7% today.  Continue current medication regimen.  Proteinuria noted on micro, continue Lisinopril.        Relevant Orders   Bayer DCA Hb A1c Waived   Microalbumin, Urine Waived   Hyperlipidemia associated with type 2 diabetes mellitus (HCC)    Chronic,  ongoing.  Continue current medication regimen.  Lipid panel next visit.      Diabetes mellitus with proteinuria (HCC)    Micro 150 and A:C 30-300.  Continue Lisinopril for kidney protection.          Other   Morbid obesity ([place])    Recommend continued focus on health diet choices and regular physical activity (30 minutes 5 days a week).  Discussed goal of loss of 1 pound per week over the next two months.       Other Visit Diagnoses    B12 deficiency       reports history of low level and would like checked today.   Relevant Orders   B12   Colon cancer screening       Relevant Orders   Ambulatory referral to Gastroenterology   History of abnormal mammogram       Relevant Orders   MM DIGITAL SCREENING BILATERAL       Follow up plan: Return in about 3 months (around 10/03/2018) for T2DM, HTN/HLD.

## 2018-07-03 NOTE — Assessment & Plan Note (Signed)
Chronic, ongoing.  Initial BP elevated, but repeat at goal.  Continue current medication regimen.

## 2018-07-03 NOTE — Patient Instructions (Signed)
Diabetes Mellitus and Nutrition, Adult  When you have diabetes (diabetes mellitus), it is very important to have healthy eating habits because your blood sugar (glucose) levels are greatly affected by what you eat and drink. Eating healthy foods in the appropriate amounts, at about the same times every day, can help you:  · Control your blood glucose.  · Lower your risk of heart disease.  · Improve your blood pressure.  · Reach or maintain a healthy weight.  Every person with diabetes is different, and each person has different needs for a meal plan. Your health care provider may recommend that you work with a diet and nutrition specialist (dietitian) to make a meal plan that is best for you. Your meal plan may vary depending on factors such as:  · The calories you need.  · The medicines you take.  · Your weight.  · Your blood glucose, blood pressure, and cholesterol levels.  · Your activity level.  · Other health conditions you have, such as heart or kidney disease.  How do carbohydrates affect me?  Carbohydrates, also called carbs, affect your blood glucose level more than any other type of food. Eating carbs naturally raises the amount of glucose in your blood. Carb counting is a method for keeping track of how many carbs you eat. Counting carbs is important to keep your blood glucose at a healthy level, especially if you use insulin or take certain oral diabetes medicines.  It is important to know how many carbs you can safely have in each meal. This is different for every person. Your dietitian can help you calculate how many carbs you should have at each meal and for each snack.  Foods that contain carbs include:  · Bread, cereal, rice, pasta, and crackers.  · Potatoes and corn.  · Peas, beans, and lentils.  · Milk and yogurt.  · Fruit and juice.  · Desserts, such as cakes, cookies, ice cream, and candy.  How does alcohol affect me?  Alcohol can cause a sudden decrease in blood glucose (hypoglycemia),  especially if you use insulin or take certain oral diabetes medicines. Hypoglycemia can be a life-threatening condition. Symptoms of hypoglycemia (sleepiness, dizziness, and confusion) are similar to symptoms of having too much alcohol.  If your health care provider says that alcohol is safe for you, follow these guidelines:  · Limit alcohol intake to no more than 1 drink per day for nonpregnant women and 2 drinks per day for men. One drink equals 12 oz of beer, 5 oz of wine, or 1½ oz of hard liquor.  · Do not drink on an empty stomach.  · Keep yourself hydrated with water, diet soda, or unsweetened iced tea.  · Keep in mind that regular soda, juice, and other mixers may contain a lot of sugar and must be counted as carbs.  What are tips for following this plan?    Reading food labels  · Start by checking the serving size on the "Nutrition Facts" label of packaged foods and drinks. The amount of calories, carbs, fats, and other nutrients listed on the label is based on one serving of the item. Many items contain more than one serving per package.  · Check the total grams (g) of carbs in one serving. You can calculate the number of servings of carbs in one serving by dividing the total carbs by 15. For example, if a food has 30 g of total carbs, it would be equal to 2   servings of carbs.  · Check the number of grams (g) of saturated and trans fats in one serving. Choose foods that have low or no amount of these fats.  · Check the number of milligrams (mg) of salt (sodium) in one serving. Most people should limit total sodium intake to less than 2,300 mg per day.  · Always check the nutrition information of foods labeled as "low-fat" or "nonfat". These foods may be higher in added sugar or refined carbs and should be avoided.  · Talk to your dietitian to identify your daily goals for nutrients listed on the label.  Shopping  · Avoid buying canned, premade, or processed foods. These foods tend to be high in fat, sodium,  and added sugar.  · Shop around the outside edge of the grocery store. This includes fresh fruits and vegetables, bulk grains, fresh meats, and fresh dairy.  Cooking  · Use low-heat cooking methods, such as baking, instead of high-heat cooking methods like deep frying.  · Cook using healthy oils, such as olive, canola, or sunflower oil.  · Avoid cooking with butter, cream, or high-fat meats.  Meal planning  · Eat meals and snacks regularly, preferably at the same times every day. Avoid going long periods of time without eating.  · Eat foods high in fiber, such as fresh fruits, vegetables, beans, and whole grains. Talk to your dietitian about how many servings of carbs you can eat at each meal.  · Eat 4-6 ounces (oz) of lean protein each day, such as lean meat, chicken, fish, eggs, or tofu. One oz of lean protein is equal to:  ? 1 oz of meat, chicken, or fish.  ? 1 egg.  ? ¼ cup of tofu.  · Eat some foods each day that contain healthy fats, such as avocado, nuts, seeds, and fish.  Lifestyle  · Check your blood glucose regularly.  · Exercise regularly as told by your health care provider. This may include:  ? 150 minutes of moderate-intensity or vigorous-intensity exercise each week. This could be brisk walking, biking, or water aerobics.  ? Stretching and doing strength exercises, such as yoga or weightlifting, at least 2 times a week.  · Take medicines as told by your health care provider.  · Do not use any products that contain nicotine or tobacco, such as cigarettes and e-cigarettes. If you need help quitting, ask your health care provider.  · Work with a counselor or diabetes educator to identify strategies to manage stress and any emotional and social challenges.  Questions to ask a health care provider  · Do I need to meet with a diabetes educator?  · Do I need to meet with a dietitian?  · What number can I call if I have questions?  · When are the best times to check my blood glucose?  Where to find more  information:  · American Diabetes Association: diabetes.org  · Academy of Nutrition and Dietetics: www.eatright.org  · National Institute of Diabetes and Digestive and Kidney Diseases (NIH): www.niddk.nih.gov  Summary  · A healthy meal plan will help you control your blood glucose and maintain a healthy lifestyle.  · Working with a diet and nutrition specialist (dietitian) can help you make a meal plan that is best for you.  · Keep in mind that carbohydrates (carbs) and alcohol have immediate effects on your blood glucose levels. It is important to count carbs and to use alcohol carefully.  This information is not intended to   replace advice given to you by your health care provider. Make sure you discuss any questions you have with your health care provider.  Document Released: 01/03/2005 Document Revised: 11/06/2016 Document Reviewed: 05/13/2016  Elsevier Interactive Patient Education © 2019 [employer].

## 2018-07-03 NOTE — Assessment & Plan Note (Signed)
Chronic, ongoing.  A1C 6.7% today.  Continue current medication regimen.  Proteinuria noted on micro, continue Lisinopril.

## 2018-07-03 NOTE — Assessment & Plan Note (Signed)
Micro 150 and A:C 30-300.  Continue Lisinopril for kidney protection.

## 2018-07-06 DIAGNOSIS — M19012 Primary osteoarthritis, left shoulder: Secondary | ICD-10-CM | POA: Insufficient documentation

## 2018-07-15 ENCOUNTER — Emergency Department
Admission: EM | Admit: 2018-07-15 | Discharge: 2018-07-15 | Disposition: A | Discharge status: Home / Self Care | Payer: Medicare HMO | Attending: Emergency Medicine | Admitting: Emergency Medicine

## 2018-07-15 ENCOUNTER — Other Ambulatory Visit: Payer: Self-pay

## 2018-07-15 ENCOUNTER — Emergency Department: Payer: Medicare HMO

## 2018-07-15 ENCOUNTER — Encounter: Payer: Self-pay | Admitting: Emergency Medicine

## 2018-07-15 DIAGNOSIS — M25551 Pain in right hip: Secondary | ICD-10-CM | POA: Diagnosis present

## 2018-07-15 DIAGNOSIS — I10 Essential (primary) hypertension: Secondary | ICD-10-CM | POA: Insufficient documentation

## 2018-07-15 DIAGNOSIS — Z79899 Other long term (current) drug therapy: Secondary | ICD-10-CM | POA: Insufficient documentation

## 2018-07-15 DIAGNOSIS — Z7984 Long term (current) use of oral hypoglycemic drugs: Secondary | ICD-10-CM | POA: Insufficient documentation

## 2018-07-15 DIAGNOSIS — M160 Bilateral primary osteoarthritis of hip: Secondary | ICD-10-CM | POA: Diagnosis not present

## 2018-07-15 DIAGNOSIS — E1140 Type 2 diabetes mellitus with diabetic neuropathy, unspecified: Secondary | ICD-10-CM | POA: Insufficient documentation

## 2018-07-15 MED ORDER — CYCLOBENZAPRINE HCL 5 MG PO TABS: 5.0000 mg | ORAL_TABLET | Freq: Two times a day (BID) | ORAL | 0 refills | Status: DC

## 2018-07-15 MED ORDER — KETOROLAC TROMETHAMINE 30 MG/ML IJ SOLN
30.0000 mg | Freq: Once | INTRAMUSCULAR | Status: AC
  Administered 2018-07-15: 30 mg via INTRAMUSCULAR
  Filled 2018-07-15: qty 1

## 2018-07-15 MED ORDER — MELOXICAM 15 MG PO TABS: 15.0000 mg | ORAL_TABLET | Freq: Every day | ORAL | 0 refills | Status: DC

## 2018-07-15 NOTE — ED Provider Notes (Signed)
[facility] Emergency Department Provider Note ____________________________________________  Time seen: Approximately 10:11 AM  I have reviewed the triage vital signs and the nursing notes.   HISTORY  Chief Complaint Hip Pain    HPI Jillian Pierce is a 63 y.o. female who presents to the emergency department for evaluation and treatment of nontraumatic right hip pain.  Patient states that she has arthritis in her left knee that has been more painful recently and feels that this is causing her to change the way she walks which has led to pain in her right hip.  She has taken tramadol without relief.  Past Medical History:  Diagnosis Date  . Diabetes mellitus without complication ([place])   . Hypertension   . Left knee pain 05/17/2014  . Morbid obesity ([place]) 02/15/2016    Patient Active Problem List   Diagnosis Date Noted  . Proteinuria 07/03/2018  . Diabetes mellitus with proteinuria ([place]) 07/03/2018  . Allergic rhinitis 04/03/2018  . Hyperlipidemia associated with type 2 diabetes mellitus ([place]) 03/05/2018  . Morbid obesity ([place]) 02/15/2016  . Type 2 diabetes mellitus with diabetic neuropathy, unspecified ([place]) 03/23/2015  . Hypertension 03/23/2015  . Left knee pain 05/17/2014    Past Surgical History:  Procedure Laterality Date  . ABDOMINAL HYSTERECTOMY     complete  . ACHILLES TENDON SURGERY Left   . HERNIA REPAIR  11/2014  . INSERTION OF MESH N/A 11/23/2014   Procedure: INSERTION OF MESH;  Surgeon: Sherri Rad, MD;  Location: [facility] ORS;  Service: General;  Laterality: N/A;  . VENTRAL HERNIA REPAIR N/A 11/23/2014   Procedure: HERNIA REPAIR VENTRAL ADULT;  Surgeon: Sherri Rad, MD;  Location: [facility] ORS;  Service: General;  Laterality: N/A;    Prior to Admission medications   Medication Sig Start Date End Date Taking? Authorizing Provider  traMADol (ULTRAM) 50 MG tablet Take 50 mg by mouth every 6 (six) hours as needed.   Yes [provider]   amLODipine (NORVASC) 10 MG tablet Take 1 tablet (10 mg total) by mouth daily. 03/05/18   Marnee Guarneri T, NP  aspirin EC 325 MG tablet Take 325 mg by mouth daily.    [provider]  atorvastatin (LIPITOR) 10 MG tablet Take 1 tablet (10 mg total) by mouth daily. 03/05/18   Cannady, Henrine Screws T, NP  cloNIDine (CATAPRES) 0.3 MG tablet Take 1 tablet (0.3 mg total) by mouth 2 (two) times daily. Reported on 10/03/2015 03/05/18   Marnee Guarneri T, NP  cyclobenzaprine (FLEXERIL) 5 MG tablet Take 1 tablet (5 mg total) by mouth 2 (two) times daily. 07/15/18   Dolora Ridgely B, FNP  fluticasone (FLONASE) 50 MCG/ACT nasal spray Place 2 sprays into both nostrils daily. 04/03/18   Cannady, Henrine Screws T, NP  hydrochlorothiazide (HYDRODIURIL) 25 MG tablet Take 0.5 tablets (12.5 mg total) by mouth daily. 03/05/18   Cannady, Henrine Screws T, NP  lisinopril (PRINIVIL,ZESTRIL) 10 MG tablet Take 1 tablet (10 mg total) by mouth daily. 03/05/18   Cannady, Henrine Screws T, NP  loratadine (CLARITIN) 10 MG tablet Take 1 tablet (10 mg total) by mouth daily. 04/03/18   Cannady, Henrine Screws T, NP  meloxicam (MOBIC) 15 MG tablet Take 1 tablet (15 mg total) by mouth daily. 07/15/18   Nicle Connole, Johnette Abraham B, FNP  metFORMIN (GLUCOPHAGE) 500 MG tablet One half tablet by mouth twice a day 03/05/18   Marnee Guarneri T, NP    Allergies Patient has no known allergies.  Family History  Problem Relation Age of  Onset  . Hypertension Mother   . Diabetes Mother   . Kidney disease Mother   . Hypertension Father   . Diabetes Father   . Heart failure Father   . Hypertension Son   . Heart disease Sister   . Heart disease Brother     Social History Social History   Tobacco Use  . Smoking status: Never Smoker  . Smokeless tobacco: Never Used  Substance Use Topics  . Alcohol use: No  . Drug use: No    Review of Systems Constitutional: Negative for fever. Cardiovascular: Negative for chest pain. Respiratory: Negative for shortness of  breath. Musculoskeletal: Positive for right hip pain Skin: Negative for open wound or lesion over the right hip Neurological: Negative for decrease in sensation  ____________________________________________   PHYSICAL EXAM:  VITAL SIGNS: ED Triage Vitals  Enc Vitals Group     BP 07/15/18 0920 (!) 168/86     Pulse Rate 07/15/18 0920 76     Resp 07/15/18 0920 16     Temp 07/15/18 0920 98.4 F (36.9 C)     Temp Source 07/15/18 0920 Oral     SpO2 07/15/18 0920 97 %     Weight 07/15/18 0920 250 lb (113.4 kg)     Height 07/15/18 0920 4\' 11"  (1.499 m)     Head Circumference --      Peak Flow --      Pain Score 07/15/18 0923 10     Pain Loc --      Pain Edu? --      Excl. in [place]? --     Constitutional: Alert and oriented. Well appearing and in no acute distress. Eyes: Conjunctivae are clear without discharge or drainage Head: Atraumatic Neck: Supple Respiratory: No cough. Respirations are even and unlabored. Musculoskeletal: Straight leg raise on the right side is positive.  Pain travels from hip posterior knee. Neurologic: Motor and sensory function is intact. Skin: No open wounds or lesions noted over the surface of the right hip or lower extremity Psychiatric: Affect and behavior are appropriate.  ____________________________________________   LABS (all labs ordered are listed, but only abnormal results are displayed)  Labs Reviewed - No data to display ____________________________________________  RADIOLOGY  Images of the hip shows mild narrowing of hip joints bilaterally without acute bony abnormalities.  There is degenerative disc and facet disease changes visualized in the lower lumbar spine.  No acute findings. ____________________________________________   PROCEDURES  Procedures  ____________________________________________   INITIAL IMPRESSION / ASSESSMENT AND PLAN / ED COURSE  Jillian Pierce is a 63 y.o. who presents to the emergency department for  treatment and evaluation of right hip pain.  Symptoms and exam most likely consistent with sciatica.  Will obtain image of the right hip to be thorough.  Patient instructed to follow-up with orthopedics.  She was also instructed to return to the emergency department for symptoms that change or worsen if unable schedule an appointment with orthopedics or primary care.  Medications  ketorolac (TORADOL) 30 MG/ML injection 30 mg (30 mg Intramuscular Given 07/15/18 1035)    Pertinent labs & imaging results that were available during my care of the patient were reviewed by me and considered in my medical decision making (see chart for details).  _________________________________________   FINAL CLINICAL IMPRESSION(S) / ED DIAGNOSES  Final diagnoses:  Osteoarthritis of both hips, unspecified osteoarthritis type    ED Discharge Orders         Ordered  cyclobenzaprine (FLEXERIL) 5 MG tablet  2 times daily     07/15/18 1206    meloxicam (MOBIC) 15 MG tablet  Daily     07/15/18 1206           If controlled substance prescribed during this visit, 12 month history viewed on the [place] prior to issuing an initial prescription for Schedule II or III opiod.   Victorino Dike, FNP 07/15/18 1509    Carrie Mew, MD 07/24/18 [phone]

## 2018-07-15 NOTE — ED Notes (Signed)
See triage note.  Presents with pain to left knee and right hip  Denies any recent injury  States she has arthritis in her knee  So thinks that her pain to hip is coming from walking

## 2018-07-15 NOTE — ED Triage Notes (Signed)
Pt here with c/o right hip pain, has arthritis in her right knee which has caused her to walk differently which is causing her pain. NAD.

## 2018-07-20 ENCOUNTER — Encounter: Payer: Self-pay | Admitting: Nurse Practitioner

## 2018-07-30 ENCOUNTER — Telehealth: Payer: Self-pay | Admitting: Gastroenterology

## 2018-07-30 NOTE — Telephone Encounter (Signed)
Gastroenterology Pre-Procedure Review  Request Date: Beatris Ship 11/03/2018    [facility] Requesting Physician: Dr. Marius Ditch  PATIENT REVIEW QUESTIONS: The patient responded to the following health history questions as indicated:    1. Are you having any GI issues? no 2. Do you have a personal history of Polyps? no 3. Do you have a family history of Colon Cancer or Polyps? NO 4. Diabetes Mellitus? no 5. Joint replacements in the past 12 months?no 6. Major health problems in the past 3 months?no 7. Any artificial heart valves, MVP, or defibrillator?no    MEDICATIONS & ALLERGIES:    Patient reports the following regarding taking any anticoagulation/antiplatelet therapy:   Plavix, Coumadin, Eliquis, Xarelto, Lovenox, Pradaxa, Brilinta, or Effient? no Aspirin? yes (325 mg)  Patient confirms/reports the following medications:  Current Outpatient Medications  Medication Sig Dispense Refill  . amLODipine (NORVASC) 10 MG tablet Take 1 tablet (10 mg total) by mouth daily. 90 tablet 3  . aspirin EC 325 MG tablet Take 325 mg by mouth daily.    Marland Kitchen atorvastatin (LIPITOR) 10 MG tablet Take 1 tablet (10 mg total) by mouth daily. 90 tablet 3  . cloNIDine (CATAPRES) 0.3 MG tablet Take 1 tablet (0.3 mg total) by mouth 2 (two) times daily. Reported on 10/03/2015 180 tablet 3  . cyclobenzaprine (FLEXERIL) 5 MG tablet Take 1 tablet (5 mg total) by mouth 2 (two) times daily. 30 tablet 0  . fluticasone (FLONASE) 50 MCG/ACT nasal spray Place 2 sprays into both nostrils daily. 16 g 6  . hydrochlorothiazide (HYDRODIURIL) 25 MG tablet Take 0.5 tablets (12.5 mg total) by mouth daily. 45 tablet 3  . lisinopril (PRINIVIL,ZESTRIL) 10 MG tablet Take 1 tablet (10 mg total) by mouth daily. 90 tablet 3  . loratadine (CLARITIN) 10 MG tablet Take 1 tablet (10 mg total) by mouth daily. 30 tablet 3  . meloxicam (MOBIC) 15 MG tablet Take 1 tablet (15 mg total) by mouth daily. 30 tablet 0  . metFORMIN (GLUCOPHAGE) 500 MG tablet One half  tablet by mouth twice a day 180 tablet 3  . traMADol (ULTRAM) 50 MG tablet Take 50 mg by mouth every 6 (six) hours as needed.     No current facility-administered medications for this visit.     Patient confirms/reports the following allergies:  No Known Allergies  No orders of the defined types were placed in this encounter.   AUTHORIZATION INFORMATION Primary Insurance: 1D#: Group #:  Secondary Insurance: 1D#: Group #:  SCHEDULE INFORMATION: Date:  Time: Location:

## 2018-08-03 ENCOUNTER — Other Ambulatory Visit: Payer: Self-pay

## 2018-08-03 DIAGNOSIS — Z1211 Encounter for screening for malignant neoplasm of colon: Secondary | ICD-10-CM

## 2018-08-17 ENCOUNTER — Other Ambulatory Visit: Payer: Self-pay | Admitting: Nurse Practitioner

## 2018-08-17 DIAGNOSIS — Z1231 Encounter for screening mammogram for malignant neoplasm of breast: Secondary | ICD-10-CM

## 2018-10-08 ENCOUNTER — Other Ambulatory Visit: Payer: Self-pay

## 2018-10-08 ENCOUNTER — Encounter: Payer: Self-pay | Admitting: Nurse Practitioner

## 2018-10-08 ENCOUNTER — Ambulatory Visit (INDEPENDENT_AMBULATORY_CARE_PROVIDER_SITE_OTHER): Payer: Medicare HMO | Admitting: Nurse Practitioner

## 2018-10-08 DIAGNOSIS — I10 Essential (primary) hypertension: Secondary | ICD-10-CM

## 2018-10-08 DIAGNOSIS — E785 Hyperlipidemia, unspecified: Secondary | ICD-10-CM

## 2018-10-08 DIAGNOSIS — E1169 Type 2 diabetes mellitus with other specified complication: Secondary | ICD-10-CM | POA: Diagnosis not present

## 2018-10-08 DIAGNOSIS — E1140 Type 2 diabetes mellitus with diabetic neuropathy, unspecified: Secondary | ICD-10-CM | POA: Diagnosis not present

## 2018-10-08 DIAGNOSIS — E1129 Type 2 diabetes mellitus with other diabetic kidney complication: Secondary | ICD-10-CM

## 2018-10-08 DIAGNOSIS — R809 Proteinuria, unspecified: Secondary | ICD-10-CM

## 2018-10-08 NOTE — Assessment & Plan Note (Signed)
Chronic, ongoing.  Continue current medication regimen and adjust as needed.  Outpatient labs next week and return in 3 months for physical.

## 2018-10-08 NOTE — Assessment & Plan Note (Signed)
Chronic, ongoing.  Continue current medication regimen, Lisinopril for kidney protection.  Recommend checking BS three mornings a week and documenting for provider.  She has glucometer at home.  Outpatient labs next week.  Return in 3 months for physical.

## 2018-10-08 NOTE — Patient Instructions (Signed)
Carbohydrate Counting for Diabetes Mellitus, Adult  Carbohydrate counting is a method of keeping track of how many carbohydrates you eat. Eating carbohydrates naturally increases the amount of sugar (glucose) in the blood. Counting how many carbohydrates you eat helps keep your blood glucose within normal limits, which helps you manage your diabetes (diabetes mellitus). It is important to know how many carbohydrates you can safely have in each meal. This is different for every person. A diet and nutrition specialist (registered dietitian) can help you make a meal plan and calculate how many carbohydrates you should have at each meal and snack. Carbohydrates are found in the following foods:  Grains, such as breads and cereals.  Dried beans and soy products.  Starchy vegetables, such as potatoes, peas, and corn.  Fruit and fruit juices.  Milk and yogurt.  Sweets and snack foods, such as cake, cookies, candy, chips, and soft drinks. How do I count carbohydrates? There are two ways to count carbohydrates in food. You can use either of the methods or a combination of both. Reading "Nutrition Facts" on packaged food The "Nutrition Facts" list is included on the labels of almost all packaged foods and beverages in the [place] It includes:  The serving size.  Information about nutrients in each serving, including the grams (g) of carbohydrate per serving. To use the "Nutrition Facts":  Decide how many servings you will have.  Multiply the number of servings by the number of carbohydrates per serving.  The resulting number is the total amount of carbohydrates that you will be having. Learning standard serving sizes of other foods When you eat carbohydrate foods that are not packaged or do not include "Nutrition Facts" on the label, you need to measure the servings in order to count the amount of carbohydrates:  Measure the foods that you will eat with a food scale or measuring cup, if needed.   Decide how many standard-size servings you will eat.  Multiply the number of servings by 15. Most carbohydrate-rich foods have about 15 g of carbohydrates per serving. ? For example, if you eat 8 oz (170 g) of strawberries, you will have eaten 2 servings and 30 g of carbohydrates (2 servings x 15 g = 30 g).  For foods that have more than one food mixed, such as soups and casseroles, you must count the carbohydrates in each food that is included. The following list contains standard serving sizes of common carbohydrate-rich foods. Each of these servings has about 15 g of carbohydrates:   hamburger bun or  English muffin.   oz (15 mL) syrup.   oz (14 g) jelly.  1 slice of bread.  1 six-inch tortilla.  3 oz (85 g) cooked rice or pasta.  4 oz (113 g) cooked dried beans.  4 oz (113 g) starchy vegetable, such as peas, corn, or potatoes.  4 oz (113 g) hot cereal.  4 oz (113 g) mashed potatoes or  of a large baked potato.  4 oz (113 g) canned or frozen fruit.  4 oz (120 mL) fruit juice.  4-6 crackers.  6 chicken nuggets.  6 oz (170 g) unsweetened dry cereal.  6 oz (170 g) plain fat-free yogurt or yogurt sweetened with artificial sweeteners.  8 oz (240 mL) milk.  8 oz (170 g) fresh fruit or one small piece of fruit.  24 oz (680 g) popped popcorn. Example of carbohydrate counting Sample meal  3 oz (85 g) chicken breast.  6 oz (170 g)   brown rice.  4 oz (113 g) corn.  8 oz (240 mL) milk.  8 oz (170 g) strawberries with sugar-free whipped topping. Carbohydrate calculation 1. Identify the foods that contain carbohydrates: ? Rice. ? Corn. ? Milk. ? Strawberries. 2. Calculate how many servings you have of each food: ? 2 servings rice. ? 1 serving corn. ? 1 serving milk. ? 1 serving strawberries. 3. Multiply each number of servings by 15 g: ? 2 servings rice x 15 g = 30 g. ? 1 serving corn x 15 g = 15 g. ? 1 serving milk x 15 g = 15 g. ? 1 serving  strawberries x 15 g = 15 g. 4. Add together all of the amounts to find the total grams of carbohydrates eaten: ? 30 g + 15 g + 15 g + 15 g = 75 g of carbohydrates total. Summary  Carbohydrate counting is a method of keeping track of how many carbohydrates you eat.  Eating carbohydrates naturally increases the amount of sugar (glucose) in the blood.  Counting how many carbohydrates you eat helps keep your blood glucose within normal limits, which helps you manage your diabetes.  A diet and nutrition specialist (registered dietitian) can help you make a meal plan and calculate how many carbohydrates you should have at each meal and snack. This information is not intended to replace advice given to you by your health care provider. Make sure you discuss any questions you have with your health care provider. Document Released: 04/08/2005 Document Revised: 10/16/2016 Document Reviewed: 09/20/2015 Elsevier Interactive Patient Education  2019 Elsevier Inc.  

## 2018-10-08 NOTE — Progress Notes (Signed)
There were no vitals taken for this visit.   Subjective:    Patient ID: Jillian Pierce, female    DOB: 01/13/1956, 63 y.o.   MRN: [id number]  HPI: Jillian Pierce is a 63 y.o. female  Chief Complaint  Patient presents with  . Hypertension    No complaints  . Hyperlipidemia  . Diabetes    . This visit was completed via telephone due to the restrictions of the COVID-19 pandemic. All issues as above were discussed and addressed but no physical exam was performed. If it was felt that the patient should be evaluated in the office, they were directed there. The patient verbally consented to this visit. Patient was unable to complete an audio/visual visit due to Lack of equipment. Due to the catastrophic nature of the COVID-19 pandemic, this visit was done through audio contact only. . Location of the patient: home . Location of the provider: home . Those involved with this call:  . Provider: Marnee Guarneri, DNP . CMA: Merilyn Baba, CMA . Front Desk/Registration: [place]  . Time spent on call: 15 minutes on the phone discussing health concerns. 10 minutes total spent in review of patient's record and preparation of their chart. I verified patient identity using two factors (patient name and date of birth). Patient consents verbally to being seen via telemedicine visit today.   DIABETES Last A1C 6.7%.  Continues on Metformin. Hypoglycemic episodes:no Polydipsia/polyuria: no Visual disturbance: no Chest pain: no Paresthesias: no Glucose Monitoring: no  Accucheck frequency: Not Checking  Fasting glucose:  Post prandial:  Evening:  Before meals: Taking Insulin?: no  Long acting insulin:  Short acting insulin: Blood Pressure Monitoring: not checking Retinal Examination: Up to Date Foot Exam: Up to Date Pneumovax: Up to Date Influenza: Up to Date Aspirin: yes   HYPERTENSION / HYPERLIPIDEMIA Continues on Lipitor and Norvasc, Lisinopril, Clonidine, and HCTZ. Satisfied with  current treatment? yes Duration of hypertension: chronic BP monitoring frequency: not checking BP range:  BP medication side effects: no Duration of hyperlipidemia: chronic Cholesterol medication side effects: no Cholesterol supplements: none Medication compliance: good compliance Aspirin: yes Recent stressors: no Recurrent headaches: no Visual changes: no Palpitations: no Dyspnea: no Chest pain: no Lower extremity edema: no Dizzy/lightheaded: no  Relevant past medical, surgical, family and social history reviewed and updated as indicated. Interim medical history since our last visit reviewed. Allergies and medications reviewed and updated.  Review of Systems  Constitutional: Negative for activity change, appetite change, diaphoresis, fatigue and fever.  Respiratory: Negative for cough, chest tightness and shortness of breath.   Cardiovascular: Negative for chest pain, palpitations and leg swelling.  Gastrointestinal: Negative for abdominal distention, abdominal pain, constipation, diarrhea, nausea and vomiting.  Endocrine: Negative for cold intolerance, heat intolerance, polydipsia, polyphagia and polyuria.  Neurological: Negative for dizziness, syncope, weakness, light-headedness, numbness and headaches.  Psychiatric/Behavioral: Negative.     Per HPI unless specifically indicated above     Objective:    There were no vitals taken for this visit.  Wt Readings from Last 3 Encounters:  07/15/18 250 lb (113.4 kg)  07/03/18 255 lb (115.7 kg)  04/03/18 246 lb 11.2 oz (111.9 kg)    Physical Exam   No visual access, phone only.  Results for orders placed or performed in visit on 07/03/18  Bayer DCA Hb A1c Waived  Result Value Ref Range   HB A1C (BAYER DCA - WAIVED) 6.7 <7.0 %  Microalbumin, Urine Waived  Result Value Ref Range  Microalb, Ur Waived 150 (H) 0 - 19 mg/L   Creatinine, Urine Waived 100 10 - 300 mg/dL   Microalb/Creat Ratio 30-300 (H) <30 mg/g       Assessment & Plan:   Problem List Items Addressed This Visit      Cardiovascular and Mediastinum   Hypertension    Chronic, ongoing.  Continue current medication regimen.  Have in office face to face visit in 3 months.  Obtain outpatient labs next week.        Endocrine   Hyperlipidemia associated with type 2 diabetes mellitus (HCC)    Chronic, ongoing.  Continue current medication regimen.  Outpatient labs next week.      Relevant Orders   Lipid Panel [place], Waived   Comprehensive metabolic panel   Diabetes mellitus with proteinuria (HCC)    Chronic, ongoing.  Continue current medication regimen, Lisinopril for kidney protection.  Recommend checking BS three mornings a week and documenting for provider.  She has glucometer at home.  Outpatient labs next week.  Return in 3 months for physical.          Nervous and Auditory   Type 2 diabetes mellitus with diabetic neuropathy, unspecified ([place]) - Primary    Chronic, ongoing.  Continue current medication regimen and adjust as needed.  Outpatient labs next week and return in 3 months for physical.        Relevant Orders   Bayer DCA Hb A1c Waived      I discussed the assessment and treatment plan with the patient. The patient was provided an opportunity to ask questions and all were answered. The patient agreed with the plan and demonstrated an understanding of the instructions.   The patient was advised to call back or seek an in-person evaluation if the symptoms worsen or if the condition fails to improve as anticipated.   I provided 15 minutes of time during this encounter.  Follow up plan: Return in about 3 months (around 01/08/2019) for annual physical.

## 2018-10-08 NOTE — Assessment & Plan Note (Signed)
Chronic, ongoing.  Continue current medication regimen.  Have in office face to face visit in 3 months.  Obtain outpatient labs next week.

## 2018-10-08 NOTE — Assessment & Plan Note (Signed)
Chronic, ongoing.  Continue current medication regimen.  Outpatient labs next week.

## 2018-10-12 ENCOUNTER — Other Ambulatory Visit: Payer: Self-pay

## 2018-10-12 ENCOUNTER — Other Ambulatory Visit: Payer: Medicare HMO

## 2018-10-12 DIAGNOSIS — E1140 Type 2 diabetes mellitus with diabetic neuropathy, unspecified: Secondary | ICD-10-CM

## 2018-10-12 DIAGNOSIS — E1169 Type 2 diabetes mellitus with other specified complication: Secondary | ICD-10-CM

## 2018-10-12 LAB — LIPID PANEL PICCOLO, WAIVED
Chol/HDL Ratio Piccolo,Waive: 2.3 mg/dL
Cholesterol Piccolo, Waived: 114 mg/dL (ref <200)
HDL Chol Piccolo, Waived: 49 mg/dL — ABNORMAL LOW (ref >59)
LDL Chol Calc Piccolo Waived: 49 mg/dL (ref <100)
Triglycerides Piccolo,Waived: 82 mg/dL (ref <150)
VLDL Chol Calc Piccolo,Waive: 16 mg/dL (ref <30)

## 2018-10-12 LAB — BAYER DCA HB A1C WAIVED: HB A1C (BAYER DCA - WAIVED): 6.6 % (ref <7.0)

## 2018-10-13 LAB — COMPREHENSIVE METABOLIC PANEL
ALT: 11 IU/L (ref 0–32)
AST: 21 IU/L (ref 0–40)
Albumin/Globulin Ratio: 1.7 (ref 1.2–2.2)
Albumin: 4.3 g/dL (ref 3.8–4.8)
Alkaline Phosphatase: 77 IU/L (ref 39–117)
BUN/Creatinine Ratio: 14 (ref 12–28)
BUN: 12 mg/dL (ref 8–27)
Bilirubin Total: 0.4 mg/dL (ref 0.0–1.2)
CO2: 20 mmol/L (ref 20–29)
Calcium: 9.8 mg/dL (ref 8.7–10.3)
Chloride: 100 mmol/L (ref 96–106)
Creatinine, Ser: 0.83 mg/dL (ref 0.57–1.00)
GFR calc Af Amer: 86 mL/min/{1.73_m2} (ref >59)
GFR calc non Af Amer: 75 mL/min/{1.73_m2} (ref >59)
Globulin, Total: 2.6 g/dL (ref 1.5–4.5)
Glucose: 150 mg/dL — ABNORMAL HIGH (ref 65–99)
Potassium: 4 mmol/L (ref 3.5–5.2)
Sodium: 138 mmol/L (ref 134–144)
Total Protein: 6.9 g/dL (ref 6.0–8.5)

## 2018-10-13 NOTE — Progress Notes (Signed)
Normal test results noted.  Please call patient and make them aware of normal results and will continue to monitor at regular visits.  Have a great day.  Look forward to seeing you at your next visit.

## 2018-10-21 ENCOUNTER — Other Ambulatory Visit: Payer: Self-pay

## 2018-10-21 ENCOUNTER — Ambulatory Visit
Admission: RE | Admit: 2018-10-21 | Discharge: 2018-10-21 | Disposition: A | Discharge status: Home / Self Care | Payer: Medicare HMO | Source: Ambulatory Visit | Attending: Nurse Practitioner | Admitting: Nurse Practitioner

## 2018-10-21 DIAGNOSIS — Z1231 Encounter for screening mammogram for malignant neoplasm of breast: Secondary | ICD-10-CM

## 2018-10-26 ENCOUNTER — Telehealth: Payer: Self-pay

## 2018-10-26 ENCOUNTER — Other Ambulatory Visit: Payer: Self-pay

## 2018-10-26 DIAGNOSIS — Z01812 Encounter for preprocedural laboratory examination: Secondary | ICD-10-CM

## 2018-10-26 NOTE — Telephone Encounter (Signed)
Patient has been advised of COVID testing.  She has been asked to have COVID on 10/30/18 at [place] between 10:30am-12:30 pm.  She has no questions regarding colonoscopy.  Thanks [employer]

## 2018-10-30 ENCOUNTER — Other Ambulatory Visit
Admission: RE | Admit: 2018-10-30 | Discharge: 2018-10-30 | Disposition: A | Discharge status: Home / Self Care | Payer: Medicare HMO | Source: Ambulatory Visit | Attending: Gastroenterology | Admitting: Gastroenterology

## 2018-10-30 ENCOUNTER — Other Ambulatory Visit: Payer: Self-pay

## 2018-10-30 DIAGNOSIS — Z1159 Encounter for screening for other viral diseases: Secondary | ICD-10-CM | POA: Diagnosis not present

## 2018-10-30 DIAGNOSIS — Z01812 Encounter for preprocedural laboratory examination: Secondary | ICD-10-CM | POA: Diagnosis present

## 2018-10-31 LAB — SARS CORONAVIRUS 2 (TAT 6-24 HRS): SARS Coronavirus 2: NEGATIVE

## 2018-11-03 ENCOUNTER — Ambulatory Visit
Admission: RE | Admit: 2018-11-03 | Discharge: 2018-11-03 | Disposition: A | Discharge status: Home / Self Care | Payer: Medicare HMO | Attending: Gastroenterology | Admitting: Gastroenterology

## 2018-11-03 ENCOUNTER — Encounter
Admission: RE | Disposition: A | Discharge status: Home / Self Care | Payer: Self-pay | Source: Home / Self Care | Attending: Gastroenterology

## 2018-11-03 ENCOUNTER — Ambulatory Visit: Payer: Medicare HMO | Admitting: Certified Registered"

## 2018-11-03 ENCOUNTER — Encounter: Payer: Self-pay | Admitting: *Deleted

## 2018-11-03 DIAGNOSIS — K5730 Diverticulosis of large intestine without perforation or abscess without bleeding: Secondary | ICD-10-CM | POA: Insufficient documentation

## 2018-11-03 DIAGNOSIS — Z7984 Long term (current) use of oral hypoglycemic drugs: Secondary | ICD-10-CM | POA: Diagnosis not present

## 2018-11-03 DIAGNOSIS — Z7982 Long term (current) use of aspirin: Secondary | ICD-10-CM | POA: Insufficient documentation

## 2018-11-03 DIAGNOSIS — Z1211 Encounter for screening for malignant neoplasm of colon: Secondary | ICD-10-CM | POA: Diagnosis present

## 2018-11-03 DIAGNOSIS — Z8249 Family history of ischemic heart disease and other diseases of the circulatory system: Secondary | ICD-10-CM | POA: Diagnosis not present

## 2018-11-03 DIAGNOSIS — Z79899 Other long term (current) drug therapy: Secondary | ICD-10-CM | POA: Diagnosis not present

## 2018-11-03 DIAGNOSIS — E119 Type 2 diabetes mellitus without complications: Secondary | ICD-10-CM | POA: Insufficient documentation

## 2018-11-03 DIAGNOSIS — Z6841 Body Mass Index (BMI) 40.0 and over, adult: Secondary | ICD-10-CM | POA: Diagnosis not present

## 2018-11-03 DIAGNOSIS — Z833 Family history of diabetes mellitus: Secondary | ICD-10-CM | POA: Insufficient documentation

## 2018-11-03 DIAGNOSIS — K635 Polyp of colon: Secondary | ICD-10-CM

## 2018-11-03 DIAGNOSIS — I10 Essential (primary) hypertension: Secondary | ICD-10-CM | POA: Insufficient documentation

## 2018-11-03 DIAGNOSIS — K644 Residual hemorrhoidal skin tags: Secondary | ICD-10-CM | POA: Insufficient documentation

## 2018-11-03 DIAGNOSIS — Z791 Long term (current) use of non-steroidal anti-inflammatories (NSAID): Secondary | ICD-10-CM | POA: Insufficient documentation

## 2018-11-03 DIAGNOSIS — D124 Benign neoplasm of descending colon: Secondary | ICD-10-CM | POA: Insufficient documentation

## 2018-11-03 HISTORY — PX: COLONOSCOPY WITH PROPOFOL: SHX5780

## 2018-11-03 LAB — GLUCOSE, CAPILLARY: Glucose-Capillary: 108 mg/dL — ABNORMAL HIGH (ref 70–99)

## 2018-11-03 SURGERY — COLONOSCOPY WITH PROPOFOL
Anesthesia: General

## 2018-11-03 MED ORDER — PROPOFOL 500 MG/50ML IV EMUL
INTRAVENOUS | Status: AC
  Filled 2018-11-03: qty 50

## 2018-11-03 MED ORDER — PROPOFOL 500 MG/50ML IV EMUL
INTRAVENOUS | Status: DC | PRN
  Administered 2018-11-03: 140 ug/kg/min via INTRAVENOUS

## 2018-11-03 MED ORDER — PROPOFOL 10 MG/ML IV BOLUS
INTRAVENOUS | Status: DC | PRN
  Administered 2018-11-03: 80 mg via INTRAVENOUS

## 2018-11-03 MED ORDER — SODIUM CHLORIDE 0.9 % IV SOLN
INTRAVENOUS | Status: DC
  Administered 2018-11-03: 13:00:00 1000 mL via INTRAVENOUS

## 2018-11-03 NOTE — H&P (Signed)
Cephas Darby, MD [address]  [place]  [place], [place] [address]  Main: [phone]  Fax: [phone] Pager: [phone]  Primary Care Physician:  Venita Lick, NP Primary Gastroenterologist:  Dr. Cephas Darby  Pre-Procedure History & Physical: HPI:  Jillian Pierce is a 63 y.o. female is here for an colonoscopy.   Past Medical History:  Diagnosis Date  . Diabetes mellitus without complication ([place])   . Hypertension   . Left knee pain 05/17/2014  . Morbid obesity ([place]) 02/15/2016    Past Surgical History:  Procedure Laterality Date  . ABDOMINAL HYSTERECTOMY     complete  . ACHILLES TENDON SURGERY Left   . HERNIA REPAIR  11/2014  . INSERTION OF MESH N/A 11/23/2014   Procedure: INSERTION OF MESH;  Surgeon: Sherri Rad, MD;  Location: [facility] ORS;  Service: General;  Laterality: N/A;  . VENTRAL HERNIA REPAIR N/A 11/23/2014   Procedure: HERNIA REPAIR VENTRAL ADULT;  Surgeon: Sherri Rad, MD;  Location: [facility] ORS;  Service: General;  Laterality: N/A;    Prior to Admission medications   Medication Sig Start Date End Date Taking? Authorizing Provider  amLODipine (NORVASC) 10 MG tablet Take 1 tablet (10 mg total) by mouth daily. 03/05/18  Yes Cannady, Henrine Screws T, NP  aspirin EC 325 MG tablet Take 325 mg by mouth daily.   Yes [provider]  atorvastatin (LIPITOR) 10 MG tablet Take 1 tablet (10 mg total) by mouth daily. 03/05/18  Yes Cannady, Jolene T, NP  cloNIDine (CATAPRES) 0.3 MG tablet Take 1 tablet (0.3 mg total) by mouth 2 (two) times daily. Reported on 10/03/2015 03/05/18  Yes Cannady, Jolene T, NP  cyclobenzaprine (FLEXERIL) 5 MG tablet Take 1 tablet (5 mg total) by mouth 2 (two) times daily. 07/15/18  Yes Triplett, Cari B, FNP  fluticasone (FLONASE) 50 MCG/ACT nasal spray Place 2 sprays into both nostrils daily. 04/03/18  Yes Cannady, Jolene T, NP  hydrochlorothiazide (HYDRODIURIL) 25 MG tablet Take 0.5 tablets (12.5 mg total) by mouth daily. 03/05/18  Yes  Cannady, Jolene T, NP  lisinopril (PRINIVIL,ZESTRIL) 10 MG tablet Take 1 tablet (10 mg total) by mouth daily. 03/05/18  Yes Cannady, Jolene T, NP  loratadine (CLARITIN) 10 MG tablet Take 1 tablet (10 mg total) by mouth daily. 04/03/18  Yes Cannady, Jolene T, NP  meloxicam (MOBIC) 15 MG tablet Take 1 tablet (15 mg total) by mouth daily. 07/15/18  Yes Triplett, Cari B, FNP  metFORMIN (GLUCOPHAGE) 500 MG tablet One half tablet by mouth twice a day 03/05/18  Yes Cannady, Jolene T, NP  traMADol (ULTRAM) 50 MG tablet Take 50 mg by mouth every 6 (six) hours as needed.   Yes [provider]    Allergies as of 08/03/2018  . (No Known Allergies)    Family History  Problem Relation Age of Onset  . Hypertension Mother   . Diabetes Mother   . Kidney disease Mother   . Hypertension Father   . Diabetes Father   . Heart failure Father   . Hypertension Son   . Heart disease Sister   . Heart disease Brother   . Breast cancer Maternal Aunt     Social History   Socioeconomic History  . Marital status: Single    Spouse name: Not on file  . Number of children: Not on file  . Years of education: Not on file  . Highest education level: Not on file  Occupational History  . Not on file  Social Needs  . Financial resource strain: Patient refused  . Food insecurity    Worry: Patient refused    Inability: Patient refused  . Transportation needs    Medical: Patient refused    Non-medical: Patient refused  Tobacco Use  . Smoking status: Never Smoker  . Smokeless tobacco: Never Used  Substance and Sexual Activity  . Alcohol use: No  . Drug use: No  . Sexual activity: Not Currently  Lifestyle  . Physical activity    Days per week: Not on file    Minutes per session: Not on file  . Stress: Not on file  Relationships  . Social Herbalist on phone: Not on file    Gets together: Not on file    Attends religious service: Not on file    Active member of club or organization:  Not on file    Attends meetings of clubs or organizations: Not on file    Relationship status: Not on file  . Intimate partner violence    Fear of current or ex partner: Not on file    Emotionally abused: Not on file    Physically abused: Not on file    Forced sexual activity: Not on file  Other Topics Concern  . Not on file  Social History Narrative  . Not on file    Review of Systems: See HPI, otherwise negative ROS  Physical Exam: BP (!) 165/102   Pulse 90   Temp 99.4 F (37.4 C) (Oral)   Resp 20   Ht 5\' 4"  (1.626 m)   Wt 110.7 kg   BMI 41.88 kg/m  General:   Alert,  pleasant and cooperative in NAD Head:  Normocephalic and atraumatic. Neck:  Supple; no masses or thyromegaly. Lungs:  Clear throughout to auscultation.    Heart:  Regular rate and rhythm. Abdomen:  Soft, nontender and nondistended. Normal bowel sounds, without guarding, and without rebound.   Neurologic:  Alert and  oriented x4;  grossly normal neurologically.  Impression/Plan: Jillian Pierce is here for an colonoscopy to be performed for colon cancer screening  Risks, benefits, limitations, and alternatives regarding  colonoscopy have been reviewed with the patient.  Questions have been answered.  All parties agreeable.   Sherri Sear, MD  11/03/2018, 1:18 PM

## 2018-11-03 NOTE — Anesthesia Postprocedure Evaluation (Signed)
Anesthesia Post Note  Patient: Jillian Pierce  Procedure(s) Performed: COLONOSCOPY WITH PROPOFOL (N/A )  Patient location during evaluation: PACU Anesthesia Type: General Level of consciousness: awake and alert Pain management: pain level controlled Vital Signs Assessment: post-procedure vital signs reviewed and stable Respiratory status: spontaneous breathing, nonlabored ventilation and respiratory function stable Cardiovascular status: blood pressure returned to baseline and stable Postop Assessment: no apparent nausea or vomiting Anesthetic complications: no     Last Vitals:  Vitals:   11/03/18 1243 11/03/18 1356  BP: (!) 165/102 113/68  Pulse: 90 90  Resp: 20 19  Temp: 37.4 C 36.4 C  SpO2:  97%    Last Pain:  Vitals:   11/03/18 1416  TempSrc:   PainSc: 0-No pain                 Durenda Hurt

## 2018-11-03 NOTE — Anesthesia Post-op Follow-up Note (Signed)
Anesthesia QCDR form completed.        

## 2018-11-03 NOTE — Anesthesia Preprocedure Evaluation (Addendum)
Anesthesia Evaluation  Patient identified by MRN, date of birth, ID band Patient awake    Reviewed: Allergy & Precautions, H&P , NPO status , Patient's Chart, lab work & pertinent test results  Airway Mallampati: I  TM Distance: >3 FB     Dental  (+) Poor Dentition   Pulmonary neg pulmonary ROS, neg shortness of breath, neg COPD,           Cardiovascular hypertension, (-) angina(-) Past MI and (-) Cardiac Stents (-) dysrhythmias      Neuro/Psych negative neurological ROS  negative psych ROS   GI/Hepatic negative GI ROS, Neg liver ROS,   Endo/Other  diabetesMorbid obesity  Renal/GU negative Renal ROS  negative genitourinary   Musculoskeletal  (+) Arthritis ,   Abdominal   Peds  Hematology negative hematology ROS (+)   Anesthesia Other Findings Past Medical History: No date: Diabetes mellitus without complication ([place]) No date: Hypertension 05/17/2014: Left knee pain 02/15/2016: Morbid obesity ([place])  Past Surgical History: No date: ABDOMINAL HYSTERECTOMY     Comment:  complete No date: ACHILLES TENDON SURGERY; Left 11/2014: HERNIA REPAIR 11/23/2014: INSERTION OF MESH; N/A     Comment:  Procedure: INSERTION OF MESH;  Surgeon: Sherri Rad, MD;                Location: [facility] ORS;  Service: General;  Laterality: N/A; 11/23/2014: VENTRAL HERNIA REPAIR; N/A     Comment:  Procedure: HERNIA REPAIR VENTRAL ADULT;  Surgeon: Sherri Rad, MD;  Location: [facility] ORS;  Service: General;                Laterality: N/A;  BMI    Body Mass Index: 41.88 kg/m      Reproductive/Obstetrics negative OB ROS                            Anesthesia Physical Anesthesia Plan  ASA: III  Anesthesia Plan: General   Post-op Pain Management:    Induction:   PONV Risk Score and Plan: Propofol infusion and TIVA  Airway Management Planned: Natural Airway and Simple Face Mask  Additional Equipment:    Intra-op Plan:   Post-operative Plan:   Informed Consent: I have reviewed the patients History and Physical, chart, labs and discussed the procedure including the risks, benefits and alternatives for the proposed anesthesia with the patient or authorized representative who has indicated his/her understanding and acceptance.     Dental Advisory Given  Plan Discussed with: Anesthesiologist and CRNA  Anesthesia Plan Comments:        Anesthesia Quick Evaluation

## 2018-11-03 NOTE — Op Note (Signed)
[facility] Gastroenterology Patient Name: Elayjah Chaney Procedure Date: 11/03/2018 1:10 PM MRN: [id number] Account #: [id number] Date of Birth: 07/30/1955 Admit Type: Outpatient Age: 63 Room: [facility] ENDO ROOM 4 Gender: Female Note Status: Finalized Procedure:            Colonoscopy Indications:          Screening for colorectal malignant neoplasm, Last                        colonoscopy: September 2008 Providers:            Lin Landsman MD, MD Referring MD:         Franchot Gallo (Referring MD) Medicines:            Monitored Anesthesia Care Complications:        No immediate complications. Estimated blood loss: None. Procedure:            Pre-Anesthesia Assessment:                       - Prior to the procedure, a History and Physical was                        performed, and patient medications and allergies were                        reviewed. The patient is competent. The risks and                        benefits of the procedure and the sedation options and                        risks were discussed with the patient. All questions                        were answered and informed consent was obtained.                        Patient identification and proposed procedure were                        verified by the physician, the nurse, the                        anesthesiologist, the anesthetist and the technician in                        the pre-procedure area in the procedure room in the                        endoscopy suite. Mental Status Examination: alert and                        oriented. Airway Examination: normal oropharyngeal                        airway and neck mobility. Respiratory Examination:                        clear to auscultation. CV Examination: normal.  Prophylactic Antibiotics: The patient does not require                        prophylactic antibiotics. Prior Anticoagulants: The   patient has taken antiplatelet medication, last dose                        was day of procedure. ASA Grade Assessment: III - A                        patient with severe systemic disease. After reviewing                        the risks and benefits, the patient was deemed in                        satisfactory condition to undergo the procedure. The                        anesthesia plan was to use monitored anesthesia care                        (MAC). Immediately prior to administration of                        medications, the patient was re-assessed for adequacy                        to receive sedatives. The heart rate, respiratory rate,                        oxygen saturations, blood pressure, adequacy of                        pulmonary ventilation, and response to care were                        monitored throughout the procedure. The physical status                        of the patient was re-assessed after the procedure.                       After obtaining informed consent, the colonoscope was                        passed under direct vision. Throughout the procedure,                        the patient's blood pressure, pulse, and oxygen                        saturations were monitored continuously. The                        Colonoscope was introduced through the anus and                        advanced to the the terminal ileum, with identification  of the appendiceal orifice and IC valve. The                        colonoscopy was performed without difficulty. The                        patient tolerated the procedure well. The quality of                        the bowel preparation was evaluated using the BBPS                        [facility] Bowel Preparation Scale) with scores of: Right                        Colon = 3, Transverse Colon = 3 and Left Colon = 3                        (entire mucosa seen well with no residual staining,                         small fragments of stool or opaque liquid). The total                        BBPS score equals 9. Findings:      The perianal and digital rectal examinations were normal. Pertinent       negatives include normal sphincter tone and no palpable rectal lesions.      A 5 mm polyp was found in the descending colon. The polyp was sessile.       The polyp was removed with a cold snare. Resection and retrieval were       complete.      Multiple diverticula were found in the sigmoid colon.      The terminal ileum appeared normal.      Unable to perform retroflexion      Non-bleeding external hemorrhoids were found. The hemorrhoids were       medium-sized. Impression:           - One 5 mm polyp in the descending colon, removed with                        a cold snare. Resected and retrieved.                       - Diverticulosis in the sigmoid colon.                       - The examined portion of the ileum was normal.                       - Non-bleeding external hemorrhoids. Recommendation:       - Discharge patient to home (with escort).                       - Resume previous diet today.                       - Continue present medications.                       -  Await pathology results.                       - Repeat colonoscopy in 7-10 years for surveillance                        based on pathology results. Procedure Code(s):    --- Professional ---                       [phone], Colonoscopy, flexible; with removal of tumor(s),                        polyp(s), or other lesion(s) by snare technique Diagnosis Code(s):    --- Professional ---                       Z12.11, Encounter for screening for malignant neoplasm                        of colon                       K63.5, Polyp of colon                       K64.4, Residual hemorrhoidal skin tags                       K57.30, Diverticulosis of large intestine without                        perforation or abscess without  bleeding CPT copyright 2019 American Medical Association. All rights reserved. The codes documented in this report are preliminary and upon coder review may  be revised to meet current compliance requirements. Dr. Ulyess Mort Lin Landsman MD, MD 11/03/2018 1:53:41 PM This report has been signed electronically. Number of Addenda: 0 Note Initiated On: 11/03/2018 1:10 PM Scope Withdrawal Time: 0 hours 10 minutes 4 seconds  Total Procedure Duration: 0 hours 15 minutes 28 seconds  Estimated Blood Loss: Estimated blood loss: none.      [facility]

## 2018-11-03 NOTE — Transfer of Care (Signed)
Immediate Anesthesia Transfer of Care Note  Patient: Jillian Pierce  Procedure(s) Performed: COLONOSCOPY WITH PROPOFOL (N/A )  Patient Location: PACU  Anesthesia Type:General  Level of Consciousness: sedated  Airway & Oxygen Therapy: Patient Spontanous Breathing and Patient connected to nasal cannula oxygen  Post-op Assessment: Report given to RN and Post -op Vital signs reviewed and stable  Post vital signs: Reviewed and stable  Last Vitals:  Vitals Value Taken Time  BP 113/68 11/03/18 1355  Temp 36.4 C 11/03/18 1356  Pulse 85 11/03/18 1356  Resp 16 11/03/18 1356  SpO2 99 % 11/03/18 1356  Vitals shown include unvalidated device data.  Last Pain:  Vitals:   11/03/18 1356  TempSrc:   PainSc: 0-No pain      Patients Stated Pain Goal: 0 (51/46/04 7998)  Complications: No apparent anesthesia complications

## 2018-11-04 ENCOUNTER — Encounter: Payer: Self-pay | Admitting: Gastroenterology

## 2018-11-05 LAB — SURGICAL PATHOLOGY

## 2018-11-09 ENCOUNTER — Encounter: Payer: Self-pay | Admitting: Gastroenterology

## 2018-11-24 ENCOUNTER — Encounter: Payer: Self-pay | Admitting: Nurse Practitioner

## 2018-11-24 ENCOUNTER — Ambulatory Visit (INDEPENDENT_AMBULATORY_CARE_PROVIDER_SITE_OTHER): Payer: Medicare HMO | Admitting: Nurse Practitioner

## 2018-11-24 ENCOUNTER — Other Ambulatory Visit: Payer: Self-pay

## 2018-11-24 VITALS — BP 133/86 | HR 88 | Temp 98.4°F

## 2018-11-24 DIAGNOSIS — Z01818 Encounter for other preprocedural examination: Secondary | ICD-10-CM

## 2018-11-24 NOTE — Progress Notes (Signed)
BP 133/86   Pulse 88   Temp 98.4 F (36.9 C) (Oral)   SpO2 96%    Subjective:    Patient ID: Jillian Pierce, female    DOB: 1956/01/29, 63 y.o.   MRN: [id number]  HPI: Jillian Pierce is a 63 y.o. female  Chief Complaint  Patient presents with  . Surgical Clearance   SURGICAL CLEARANCE: Reports surgery is going to be in September.  Having left total knee replacement by Dr. Harlow Mares with [facility].  Requires EKG, CBC, CMP, A1C, and urinalysis for preop.  Denies any SOB, CP, N&V, fever, palpitations.  Recent A1C 10/12/2018 was 6.6%, has been well-controlled on Metformin.  Discussed at length with patient importance of following all physical therapy instruction post-op to ensure good recovery from procedure.  Relevant past medical, surgical, family and social history reviewed and updated as indicated. Interim medical history since our last visit reviewed. Allergies and medications reviewed and updated.  Review of Systems  Constitutional: Negative for activity change, appetite change, diaphoresis, fatigue and fever.  Respiratory: Negative for cough, chest tightness and shortness of breath.   Cardiovascular: Negative for chest pain, palpitations and leg swelling.  Gastrointestinal: Negative for abdominal distention, abdominal pain, constipation, diarrhea, nausea and vomiting.  Endocrine: Negative for cold intolerance, heat intolerance, polydipsia, polyphagia and polyuria.  Neurological: Negative for dizziness, syncope, weakness, light-headedness, numbness and headaches.  Psychiatric/Behavioral: Negative.     Per HPI unless specifically indicated above     Objective:    BP 133/86   Pulse 88   Temp 98.4 F (36.9 C) (Oral)   SpO2 96%   Wt Readings from Last 3 Encounters:  11/03/18 244 lb (110.7 kg)  07/15/18 250 lb (113.4 kg)  07/03/18 255 lb (115.7 kg)    Physical Exam Vitals signs and nursing note reviewed.  Constitutional:      General: She is awake. She is not in acute  distress.    Appearance: She is well-developed. She is morbidly obese. She is not ill-appearing.  HENT:     Head: Normocephalic.     Right Ear: Hearing normal.     Left Ear: Hearing normal.     Nose: Nose normal.     Mouth/Throat:     Mouth: Mucous membranes are moist.  Eyes:     General: Lids are normal.        Right eye: No discharge.        Left eye: No discharge.     Conjunctiva/sclera: Conjunctivae normal.     Pupils: Pupils are equal, round, and reactive to light.  Neck:     Musculoskeletal: Normal range of motion and neck supple.     Thyroid: No thyromegaly.     Vascular: No carotid bruit.  Cardiovascular:     Rate and Rhythm: Normal rate and regular rhythm.     Heart sounds: Normal heart sounds. No murmur. No gallop.   Pulmonary:     Effort: Pulmonary effort is normal. No accessory muscle usage or respiratory distress.     Breath sounds: Normal breath sounds.  Abdominal:     General: Bowel sounds are normal.     Palpations: Abdomen is soft. There is no hepatomegaly or splenomegaly.     Tenderness: There is no abdominal tenderness.  Musculoskeletal:     Right lower leg: 1+ Edema present.     Left lower leg: 1+ Edema present.  Lymphadenopathy:     Cervical: No cervical adenopathy.  Skin:  General: Skin is warm and dry.  Neurological:     Mental Status: She is alert and oriented to person, place, and time.     Deep Tendon Reflexes: Reflexes are normal and symmetric.  Psychiatric:        Attention and Perception: Attention normal.        Mood and Affect: Mood normal.        Speech: Speech normal.        Behavior: Behavior normal. Behavior is cooperative.        Thought Content: Thought content normal.        Judgment: Judgment normal.    EKG noting NSR with HR 85 and normal axis.  Artifact present due to patient shivering during procedure per her report.  Results for orders placed or performed during the hospital encounter of 11/03/18  Glucose, capillary   Result Value Ref Range   Glucose-Capillary 108 (H) 70 - 99 mg/dL  Surgical pathology  Result Value Ref Range   SURGICAL PATHOLOGY      Surgical Pathology CASE: [id number] PATIENT: Francis Gaines Surgical Pathology Report     SPECIMEN SUBMITTED: A. Colon polyp, descending; cold snare  CLINICAL HISTORY: None provided  PRE-OPERATIVE DIAGNOSIS: Screening colonoscopy  POST-OPERATIVE DIAGNOSIS: Colon polyp, mild diverticulosis    DIAGNOSIS: A. COLON POLYP, DESCENDING; COLD SNARE: - TUBULAR ADENOMA. - NEGATIVE FOR HIGH-GRADE DYSPLASIA AND MALIGNANCY.  GROSS DESCRIPTION: A. Labeled: Descending colon polyp cold snare Received: Formalin Tissue fragment(s): 1 Size: 0.9 cm Description: Tan soft tissue fragment Entirely submitted in 1 cassette.   Final Diagnosis performed by Allena Napoleon, MD.   Electronically signed 11/05/2018 2:44:23PM The electronic signature indicates that the named Attending Pathologist has evaluated the specimen  Technical component performed at [facility], [address] [place], [place] [address] Lab: [phone] Dir: Rush Farmer, MD, MMM  Professional component perform ed at [facility], [facility], [place], [place], [place] [address] Lab: [phone] Dir: Dellia Nims. Reuel Derby, MD       Assessment & Plan:   Problem List Items Addressed This Visit    None    Visit Diagnoses    Preoperative clearance    -  Primary   Preop EKG and labs ordered.  Will sign off on clearance after labs returned and reviewed.  Discussed at length post-op care with patient.   Relevant Orders   EKG 12-Lead (Completed)   CBC with Differential/Platelet   Comprehensive metabolic panel   UA/M w/rflx Culture, Routine   HgB A1c       Follow up plan: Return if symptoms worsen or fail to improve, for and as scheduled.

## 2018-11-24 NOTE — Patient Instructions (Signed)
Preparing for Knee Replacement °Getting prepared before knee replacement surgery can make your recovery easier and more comfortable. This document provides some tips and guidelines that will help you prepare for your surgery. °Talk with your health care provider so you can learn what to expect before, during, and after surgery. Ask questions if you do not understand something. °To ease concerns about your financial responsibilities, call your insurance company as soon as you decide to have surgery. Ask how much of your surgery and hospital stay will be covered. Also ask about coverage for medical equipment, rehabilitation facilities, and home care. °How should I arrange for help? °In the first couple weeks after surgery, it will likely be harder for you to do some of your regular activities. You may get tired easily, and you will have limited movement in your leg. Follow these guidelines to make sure you have all the help you need after your surgery: °· Plan to have someone take you home from the hospital. Your health care provider will tell you how many days you can expect to be in the hospital. °· Cancel all your work, caregiving, and volunteer responsibilities for at least 4-6 weeks after your surgery. °· Plan to have someone stay with you day and night for the first week. This person should be someone you are comfortable with. You may need this person to help you with your exercises and personal care, such as bathing and using the toilet. °· If you live alone, arrange for someone to take care of your home and pets for the first 4-6 weeks after surgery. °· Arrange for drivers to take you to and from follow-up appointments, the grocery store, and other places you may need to go for at least 4-6 weeks. °· Consider applying for a disabled parking permit. To get an application, contact the Department of Motor Vehicles or your health care provider's office. °How should I prepare my home? ° °  ° °· Pick a recovery  spot, but do not plan on recovering in bed. Sitting upright is better for your health. You may want to use a recliner with a small table nearby. Place the items you use most frequently on that table. These may include the TV remote, a cordless phone, a cell phone, a book or laptop computer, and a water glass. °· To see if you will be able to move around in your home with a walker, hold your hands out about 6 inches (15 cm) from your sides, and walk from your recovery spot to your kitchen and bathroom. Then walk from your bed to the bathroom. If you do not hit anything with your hands, you will have enough room for a walker. °· Minimize the use of stairs after you return home to reduce your risk of falling or tripping. °· Remove all clutter from your floors. Also remove any throw rugs. This will help you avoid tripping after your surgery. °· Move the items you use most often in your kitchen, bathroom, and bedroom to shelves and drawers that are at countertop height. °· Prepare a few meals to freeze and reheat later. °· Consider getting safety equipment that will be helpful during your recovery, such as: °? Grab bars added in the shower and near the toilet. °? A raised toilet seat to help you get on and off the toilet more easily. °? A tub or shower bench. °How should I prepare my body? °· Have a preoperative exam. °? During the exam, your health   care provider will make sure that your body is healthy enough to safely have the surgery. ? When you go to the exam, bring a complete list of all your medicines and supplements, including herbs and vitamins. ? You may need to have additional tests to ensure your safety.  Have elective dental care and routine cleanings done before your surgery. Germs from anywhere in your body, including your mouth, can travel to your new joint and infect it. It is important that you do not have any dental work done for at least 3 months after your surgery.  Maintain a healthy diet. Do  not change your diet before surgery unless your health care provider tells you to do that.  Do not use any products that contain nicotine or tobacco, such as cigarettes and e-cigarettes. These can delay bone healing after surgery. If you need help quitting, ask your health care provider.  Tell your health care provider if: ? You develop any skin infections or skin irritations. You may need to improve the condition of your skin before surgery. ? You have a fever, a cold, or any other illness in the week before your surgery.  Do not drink any alcohol for at least 48 hours before surgery.  The day before your surgery, follow instructions from your health care provider about showering, eating, drinking, and taking medicines. These directions are for your safety.  Talk to your health care provider about doing exercises before your surgery. ? Be sure to follow the exercise program only as directed by your health care provider. ? Doing these exercises in the weeks before your surgery may help reduce pain and improve function after surgery. Summary  Getting prepared before knee replacement surgery can make your recovery easier and more comfortable.  Prepare your home and arrange for help at home.  Keep all of your preoperative appointments to ensure that you are ready for your surgery.  Plan to have someone take you home from the hospital and stay with you day and night for the first week. This information is not intended to replace advice given to you by your health care provider. Make sure you discuss any questions you have with your health care provider. Document Released: 07/13/2010 Document Revised: 05/26/2017 Document Reviewed: 05/26/2017 Elsevier Patient Education  2020 [employer].

## 2018-11-25 LAB — CBC WITH DIFFERENTIAL/PLATELET
Basophils Absolute: 0 10*3/uL (ref 0.0–0.2)
Basos: 1 %
EOS (ABSOLUTE): 0.2 10*3/uL (ref 0.0–0.4)
Eos: 3 %
Hematocrit: 36.4 % (ref 34.0–46.6)
Hemoglobin: 11.5 g/dL (ref 11.1–15.9)
Immature Grans (Abs): 0 10*3/uL (ref 0.0–0.1)
Immature Granulocytes: 0 %
Lymphocytes Absolute: 1.7 10*3/uL (ref 0.7–3.1)
Lymphs: 23 %
MCH: 26 pg — ABNORMAL LOW (ref 26.6–33.0)
MCHC: 31.6 g/dL (ref 31.5–35.7)
MCV: 82 fL (ref 79–97)
Monocytes Absolute: 0.6 10*3/uL (ref 0.1–0.9)
Monocytes: 8 %
Neutrophils Absolute: 4.9 10*3/uL (ref 1.4–7.0)
Neutrophils: 65 %
Platelets: 514 10*3/uL — ABNORMAL HIGH (ref 150–450)
RBC: 4.43 x10E6/uL (ref 3.77–5.28)
RDW: 14.1 % (ref 11.7–15.4)
WBC: 7.5 10*3/uL (ref 3.4–10.8)

## 2018-11-25 LAB — COMPREHENSIVE METABOLIC PANEL
ALT: 10 IU/L (ref 0–32)
AST: 14 IU/L (ref 0–40)
Albumin/Globulin Ratio: 1.7 (ref 1.2–2.2)
Albumin: 4.5 g/dL (ref 3.8–4.8)
Alkaline Phosphatase: 86 IU/L (ref 39–117)
BUN/Creatinine Ratio: 17 (ref 12–28)
BUN: 14 mg/dL (ref 8–27)
Bilirubin Total: 0.2 mg/dL (ref 0.0–1.2)
CO2: 22 mmol/L (ref 20–29)
Calcium: 9.9 mg/dL (ref 8.7–10.3)
Chloride: 102 mmol/L (ref 96–106)
Creatinine, Ser: 0.81 mg/dL (ref 0.57–1.00)
GFR calc Af Amer: 89 mL/min/{1.73_m2} (ref >59)
GFR calc non Af Amer: 77 mL/min/{1.73_m2} (ref >59)
Globulin, Total: 2.7 g/dL (ref 1.5–4.5)
Glucose: 125 mg/dL — ABNORMAL HIGH (ref 65–99)
Potassium: 3.8 mmol/L (ref 3.5–5.2)
Sodium: 144 mmol/L (ref 134–144)
Total Protein: 7.2 g/dL (ref 6.0–8.5)

## 2018-11-25 LAB — HEMOGLOBIN A1C
Est. average glucose Bld gHb Est-mCnc: 134 mg/dL
Hgb A1c MFr Bld: 6.3 % — ABNORMAL HIGH (ref 4.8–5.6)

## 2018-11-26 LAB — UA/M W/RFLX CULTURE, ROUTINE
Bilirubin, UA: NEGATIVE
Glucose, UA: NEGATIVE
Ketones, UA: NEGATIVE
Nitrite, UA: NEGATIVE
Specific Gravity, UA: 1.02 (ref 1.005–1.030)
Urobilinogen, Ur: 0.2 mg/dL (ref 0.2–1.0)
pH, UA: 7 (ref 5.0–7.5)

## 2018-11-26 LAB — MICROSCOPIC EXAMINATION

## 2018-11-26 LAB — URINE CULTURE, REFLEX

## 2018-12-18 HISTORY — PX: JOINT REPLACEMENT: SHX530

## 2019-01-04 ENCOUNTER — Encounter: Payer: Self-pay | Admitting: Nurse Practitioner

## 2019-01-19 ENCOUNTER — Other Ambulatory Visit: Payer: Self-pay

## 2019-02-12 ENCOUNTER — Ambulatory Visit (INDEPENDENT_AMBULATORY_CARE_PROVIDER_SITE_OTHER): Payer: Medicare HMO | Admitting: Nurse Practitioner

## 2019-02-12 ENCOUNTER — Encounter: Payer: Self-pay | Admitting: Nurse Practitioner

## 2019-02-12 ENCOUNTER — Other Ambulatory Visit: Payer: Self-pay

## 2019-02-12 VITALS — BP 130/78 | HR 76 | Temp 99.0°F | Ht 61.0 in | Wt 243.2 lb

## 2019-02-12 DIAGNOSIS — R809 Proteinuria, unspecified: Secondary | ICD-10-CM

## 2019-02-12 DIAGNOSIS — Z23 Encounter for immunization: Secondary | ICD-10-CM

## 2019-02-12 DIAGNOSIS — E1129 Type 2 diabetes mellitus with other diabetic kidney complication: Secondary | ICD-10-CM | POA: Diagnosis not present

## 2019-02-12 DIAGNOSIS — E785 Hyperlipidemia, unspecified: Secondary | ICD-10-CM

## 2019-02-12 DIAGNOSIS — E1169 Type 2 diabetes mellitus with other specified complication: Secondary | ICD-10-CM

## 2019-02-12 DIAGNOSIS — E538 Deficiency of other specified B group vitamins: Secondary | ICD-10-CM

## 2019-02-12 DIAGNOSIS — Z0000 Encounter for general adult medical examination without abnormal findings: Secondary | ICD-10-CM

## 2019-02-12 DIAGNOSIS — I10 Essential (primary) hypertension: Secondary | ICD-10-CM

## 2019-02-12 DIAGNOSIS — E559 Vitamin D deficiency, unspecified: Secondary | ICD-10-CM

## 2019-02-12 LAB — BAYER DCA HB A1C WAIVED: HB A1C (BAYER DCA - WAIVED): 6.4 % (ref <7.0)

## 2019-02-12 NOTE — Assessment & Plan Note (Signed)
Chronic, stable with BP at goal.  Continue current medication regimen and adjust as needed.  Recommend checking BP at home three mornings a week.  Labs today.  Return in 3 months.

## 2019-02-12 NOTE — Progress Notes (Signed)
BP 130/78   Pulse 76   Temp 99 F (37.2 C) (Oral)   Ht 5\' 1"  (1.549 m)   Wt 243 lb 3.2 oz (110.3 kg)   SpO2 98%   BMI 45.95 kg/m    Subjective:    Patient ID: Jillian Pierce, female    DOB: [birth year], 63 y.o.   MRN: [id number]  HPI: Jillian Pierce is a 63 y.o. female presenting on 02/12/2019 for comprehensive medical examination. Current medical complaints include:none  She currently lives with: self Menopausal Symptoms: no   DIABETES Last A1C 6.3%.  Continues on Metformin. Hypoglycemic episodes:no Polydipsia/polyuria: no Visual disturbance: no Chest pain: no Paresthesias: no Glucose Monitoring: no  Accucheck frequency: Not Checking  Fasting glucose:  Post prandial:  Evening:  Before meals: Taking Insulin?: no  Long acting insulin:  Short acting insulin: Blood Pressure Monitoring: not checking Retinal Examination: Up to Date Foot Exam: Up to Date Pneumovax: Up to Date Influenza: Up to Date Aspirin: yes   HYPERTENSION / HYPERLIPIDEMIA Continues on Lipitor and Norvasc, Lisinopril, Clonidine, and HCTZ. Satisfied with current treatment? yes Duration of hypertension: chronic BP monitoring frequency: not checking BP range:  BP medication side effects: no Duration of hyperlipidemia: chronic Cholesterol medication side effects: no Cholesterol supplements: none Medication compliance: good compliance Aspirin: yes Recent stressors: no Recurrent headaches: no Visual changes: no Palpitations: no Dyspnea: no Chest pain: no Lower extremity edema: no Dizzy/lightheaded: no  Depression Screen done today and results listed below:  Depression screen [facility] 2/9 02/12/2019 08/25/2017  Decreased Interest 0 0  Down, Depressed, Hopeless 0 0  PHQ - 2 Score 0 0    The patient does not have a history of falls. I did not complete a risk assessment for falls. A plan of care for falls was not documented.   Past Medical History:  Past Medical History:  Diagnosis Date  .  Diabetes mellitus without complication ([place])   . Hypertension   . Left knee pain 05/17/2014  . Morbid obesity ([place]) 02/15/2016    Surgical History:  Past Surgical History:  Procedure Laterality Date  . ABDOMINAL HYSTERECTOMY     complete  . ACHILLES TENDON SURGERY Left   . COLONOSCOPY WITH PROPOFOL N/A 11/03/2018   Procedure: COLONOSCOPY WITH PROPOFOL;  Surgeon: Lin Landsman, MD;  Location: [facility] ENDOSCOPY;  Service: Gastroenterology;  Laterality: N/A;  . HERNIA REPAIR  11/2014  . INSERTION OF MESH N/A 11/23/2014   Procedure: INSERTION OF MESH;  Surgeon: Sherri Rad, MD;  Location: [facility] ORS;  Service: General;  Laterality: N/A;  . JOINT REPLACEMENT Left 12/18/2018  . VENTRAL HERNIA REPAIR N/A 11/23/2014   Procedure: HERNIA REPAIR VENTRAL ADULT;  Surgeon: Sherri Rad, MD;  Location: [facility] ORS;  Service: General;  Laterality: N/A;    Medications:  Current Outpatient Medications on File Prior to Visit  Medication Sig  . amLODipine (NORVASC) 10 MG tablet Take 1 tablet (10 mg total) by mouth daily.  Marland Kitchen aspirin EC 81 MG tablet Take 81 mg by mouth daily.  Marland Kitchen atorvastatin (LIPITOR) 10 MG tablet Take 1 tablet (10 mg total) by mouth daily.  . cloNIDine (CATAPRES) 0.3 MG tablet Take 1 tablet (0.3 mg total) by mouth 2 (two) times daily. Reported on 10/03/2015  . fluticasone (FLONASE) 50 MCG/ACT nasal spray Place 2 sprays into both nostrils daily.  . hydrochlorothiazide (HYDRODIURIL) 25 MG tablet Take 0.5 tablets (12.5 mg total) by mouth daily.  Marland Kitchen HYDROcodone-acetaminophen (NORCO/VICODIN) 5-325 MG tablet hydrocodone 5 mg-acetaminophen 325  mg tablet  Take 1 tablet every 6 hours by oral route as needed.  Marland Kitchen lisinopril (PRINIVIL,ZESTRIL) 10 MG tablet Take 1 tablet (10 mg total) by mouth daily.  . metFORMIN (GLUCOPHAGE) 500 MG tablet One half tablet by mouth twice a day  . traMADol (ULTRAM) 50 MG tablet Take 50 mg by mouth every 6 (six) hours as needed.  . cyclobenzaprine (FLEXERIL) 5 MG tablet Take 1  tablet (5 mg total) by mouth 2 (two) times daily. (Patient not taking: Reported on 11/24/2018)  . loratadine (CLARITIN) 10 MG tablet Take 1 tablet (10 mg total) by mouth daily. (Patient not taking: Reported on 11/24/2018)  . meloxicam (MOBIC) 15 MG tablet Take 1 tablet (15 mg total) by mouth daily. (Patient not taking: Reported on 11/24/2018)   No current facility-administered medications on file prior to visit.     Allergies:  No Known Allergies  Social History:  Social History   Socioeconomic History  . Marital status: Single    Spouse name: Not on file  . Number of children: Not on file  . Years of education: Not on file  . Highest education level: Not on file  Occupational History  . Not on file  Social Needs  . Financial resource strain: Patient refused  . Food insecurity    Worry: Patient refused    Inability: Patient refused  . Transportation needs    Medical: Patient refused    Non-medical: Patient refused  Tobacco Use  . Smoking status: Never Smoker  . Smokeless tobacco: Never Used  Substance and Sexual Activity  . Alcohol use: No  . Drug use: No  . Sexual activity: Not Currently  Lifestyle  . Physical activity    Days per week: Not on file    Minutes per session: Not on file  . Stress: Not on file  Relationships  . Social Herbalist on phone: Not on file    Gets together: Not on file    Attends religious service: Not on file    Active member of club or organization: Not on file    Attends meetings of clubs or organizations: Not on file    Relationship status: Not on file  . Intimate partner violence    Fear of current or ex partner: Not on file    Emotionally abused: Not on file    Physically abused: Not on file    Forced sexual activity: Not on file  Other Topics Concern  . Not on file  Social History Narrative  . Not on file   Social History   Tobacco Use  Smoking Status Never Smoker  Smokeless Tobacco Never Used   Social History    Substance and Sexual Activity  Alcohol Use No    Family History:  Family History  Problem Relation Age of Onset  . Hypertension Mother   . Diabetes Mother   . Kidney disease Mother   . Hypertension Father   . Diabetes Father   . Heart failure Father   . Hypertension Son   . Heart disease Sister   . Heart disease Brother   . Breast cancer Maternal Aunt     Past medical history, surgical history, medications, allergies, family history and social history reviewed with patient today and changes made to appropriate areas of the chart.   Review of Systems -  negative All other ROS negative except what is listed above and in the HPI.      Objective:  BP 130/78   Pulse 76   Temp 99 F (37.2 C) (Oral)   Ht 5\' 1"  (1.549 m)   Wt 243 lb 3.2 oz (110.3 kg)   SpO2 98%   BMI 45.95 kg/m   Wt Readings from Last 3 Encounters:  02/12/19 243 lb 3.2 oz (110.3 kg)  11/03/18 244 lb (110.7 kg)  07/15/18 250 lb (113.4 kg)    Physical Exam Constitutional:      General: She is awake. She is not in acute distress.    Appearance: She is well-developed. She is not ill-appearing.  HENT:     Head: Normocephalic and atraumatic.     Right Ear: Hearing, tympanic membrane, ear canal and external ear normal. No drainage.     Left Ear: Hearing, tympanic membrane, ear canal and external ear normal. No drainage.     Nose: Nose normal.     Right Sinus: No maxillary sinus tenderness or frontal sinus tenderness.     Left Sinus: No maxillary sinus tenderness or frontal sinus tenderness.     Mouth/Throat:     Mouth: Mucous membranes are moist.     Pharynx: Oropharynx is clear. Uvula midline. No pharyngeal swelling, oropharyngeal exudate or posterior oropharyngeal erythema.  Eyes:     General: Lids are normal.        Right eye: No discharge.        Left eye: No discharge.     Extraocular Movements: Extraocular movements intact.     Conjunctiva/sclera: Conjunctivae normal.     Pupils: Pupils are  equal, round, and reactive to light.     Visual Fields: Right eye visual fields normal and left eye visual fields normal.  Neck:     Musculoskeletal: Normal range of motion and neck supple.     Thyroid: No thyromegaly.     Vascular: No carotid bruit.     Trachea: Trachea normal.  Cardiovascular:     Rate and Rhythm: Normal rate and regular rhythm.     Heart sounds: Normal heart sounds. No murmur. No gallop.   Pulmonary:     Effort: Pulmonary effort is normal. No accessory muscle usage or respiratory distress.     Breath sounds: Normal breath sounds.  Chest:     Breasts:        Right: Normal. No swelling, bleeding, inverted nipple, mass, nipple discharge, skin change or tenderness.        Left: Normal. No swelling, bleeding, inverted nipple, mass, nipple discharge, skin change or tenderness.  Abdominal:     General: Bowel sounds are normal.     Palpations: Abdomen is soft. There is no hepatomegaly or splenomegaly.     Tenderness: There is no abdominal tenderness.  Musculoskeletal: Normal range of motion.     Right lower leg: No edema.     Left lower leg: No edema.  Lymphadenopathy:     Head:     Right side of head: No submental, submandibular, tonsillar, preauricular or posterior auricular adenopathy.     Left side of head: No submental, submandibular, tonsillar, preauricular or posterior auricular adenopathy.     Cervical: No cervical adenopathy.     Upper Body:     Right upper body: No supraclavicular, axillary or pectoral adenopathy.     Left upper body: No supraclavicular, axillary or pectoral adenopathy.  Skin:    General: Skin is warm and dry.     Capillary Refill: Capillary refill takes less than 2 seconds.     Findings: No  rash.  Neurological:     Mental Status: She is alert and oriented to person, place, and time.     Cranial Nerves: Cranial nerves are intact.     Gait: Gait is intact.     Deep Tendon Reflexes: Reflexes are normal and symmetric.     Reflex Scores:       Brachioradialis reflexes are 2+ on the right side and 2+ on the left side.      Patellar reflexes are 2+ on the right side and 2+ on the left side. Psychiatric:        Attention and Perception: Attention normal.        Mood and Affect: Mood normal.        Speech: Speech normal.        Behavior: Behavior normal. Behavior is cooperative.        Thought Content: Thought content normal.        Judgment: Judgment normal.    Diabetic Foot Exam - Simple   Simple Foot Form Visual Inspection No deformities, no ulcerations, no other skin breakdown bilaterally: Yes Sensation Testing Intact to touch and monofilament testing bilaterally: Yes Pulse Check Posterior Tibialis and Dorsalis pulse intact bilaterally: Yes Comments     Results for orders placed or performed in visit on 01/19/19  HM DIABETES EYE EXAM  Result Value Ref Range   HM Diabetic Eye Exam No Retinopathy No Retinopathy      Assessment & Plan:   Problem List Items Addressed This Visit      Cardiovascular and Mediastinum   Hypertension    Chronic, stable with BP at goal.  Continue current medication regimen and adjust as needed.  Recommend checking BP at home three mornings a week.  Labs today.  Return in 3 months.        Endocrine   Hyperlipidemia associated with type 2 diabetes mellitus (HCC)    Chronic, ongoing.  Continue current medication regimen and adjust as needed.  Labs today.  Return in 3 months.      Relevant Orders   Comprehensive metabolic panel   Lipid Panel w/o Chol/HDL Ratio   Diabetes mellitus with proteinuria (HCC)    Chronic, ongoing.  Continue current medication regimen, Lisinopril for kidney protection.  Recommend checking BS three mornings a week and documenting for provider.  Labs today and will adjust regimen as needed. Return in 3 months.      Relevant Orders   Bayer DCA Hb A1c Waived     Other   Morbid obesity ([place])    Recommend continued focus on health diet choices and regular  physical activity (30 minutes 5 days a week).        Other Visit Diagnoses    Annual physical exam    -  Primary   Relevant Orders   CBC with Differential/Platelet   TSH   Vitamin D deficiency       Check Vit D level today   Relevant Orders   VITAMIN D 25 Hydroxy (Vit-D Deficiency, Fractures)   Vitamin B12 deficiency       Check B12 level today   Relevant Orders   Vitamin B12   Need for influenza vaccination       Relevant Orders   Flu Vaccine QUAD 36+ mos IM (Completed)       Follow up plan: Return in about 3 months (around 05/15/2019) for T2DM, HTN/HLD.   LABORATORY TESTING:  - Pap smear: not applicable, history of hysterectomy  IMMUNIZATIONS:   -  Tdap: Tetanus vaccination status reviewed: last tetanus booster within 10 years. - Influenza: Up to date - Pneumovax: Up to date - Prevnar: Not applicable - HPV: Not applicable - Zostavax vaccine: refused  SCREENING: -Mammogram: Up to date  - Colonoscopy: Up to date  - Bone Density: Not applicable  -Hearing Test: Not applicable  -Spirometry: Not applicable   PATIENT COUNSELING:   Advised to take 1 mg of folate supplement per day if capable of pregnancy.   Sexuality: Discussed sexually transmitted diseases, partner selection, use of condoms, avoidance of unintended pregnancy  and contraceptive alternatives.   Advised to avoid cigarette smoking.  I discussed with the patient that most people either abstain from alcohol or drink within safe limits (<=14/week and <=4 drinks/occasion for males, <=7/weeks and <= 3 drinks/occasion for females) and that the risk for alcohol disorders and other health effects rises proportionally with the number of drinks per week and how often a drinker exceeds daily limits.  Discussed cessation/primary prevention of drug use and availability of treatment for abuse.   Diet: Encouraged to adjust caloric intake to maintain  or achieve ideal body weight, to reduce intake of dietary saturated  fat and total fat, to limit sodium intake by avoiding high sodium foods and not adding table salt, and to maintain adequate dietary potassium and calcium preferably from fresh fruits, vegetables, and low-fat dairy products.    stressed the importance of regular exercise  Injury prevention: Discussed safety belts, safety helmets, smoke detector, smoking near bedding or upholstery.   Dental health: Discussed importance of regular tooth brushing, flossing, and dental visits.    NEXT PREVENTATIVE PHYSICAL DUE IN 1 YEAR. Return in about 3 months (around 05/15/2019) for T2DM, HTN/HLD.

## 2019-02-12 NOTE — Assessment & Plan Note (Signed)
Chronic, ongoing.  Continue current medication regimen and adjust as needed.  Labs today.  Return in 3 months.

## 2019-02-12 NOTE — Patient Instructions (Signed)

## 2019-02-12 NOTE — Assessment & Plan Note (Signed)
Chronic, ongoing.  Continue current medication regimen, Lisinopril for kidney protection.  Recommend checking BS three mornings a week and documenting for provider.  Labs today and will adjust regimen as needed. Return in 3 months.

## 2019-02-12 NOTE — Assessment & Plan Note (Signed)
Recommend continued focus on health diet choices and regular physical activity (30 minutes 5 days a week). 

## 2019-02-13 LAB — COMPREHENSIVE METABOLIC PANEL
ALT: 6 IU/L (ref 0–32)
AST: 14 IU/L (ref 0–40)
Albumin/Globulin Ratio: 1.2 (ref 1.2–2.2)
Albumin: 4.1 g/dL (ref 3.8–4.8)
Alkaline Phosphatase: 92 IU/L (ref 39–117)
BUN/Creatinine Ratio: 14 (ref 12–28)
BUN: 13 mg/dL (ref 8–27)
Bilirubin Total: <0.2 mg/dL (ref 0.0–1.2)
CO2: 21 mmol/L (ref 20–29)
Calcium: 9.7 mg/dL (ref 8.7–10.3)
Chloride: 102 mmol/L (ref 96–106)
Creatinine, Ser: 0.91 mg/dL (ref 0.57–1.00)
GFR calc Af Amer: 77 mL/min/{1.73_m2} (ref >59)
GFR calc non Af Amer: 67 mL/min/{1.73_m2} (ref >59)
Globulin, Total: 3.3 g/dL (ref 1.5–4.5)
Glucose: 166 mg/dL — ABNORMAL HIGH (ref 65–99)
Potassium: 4.3 mmol/L (ref 3.5–5.2)
Sodium: 140 mmol/L (ref 134–144)
Total Protein: 7.4 g/dL (ref 6.0–8.5)

## 2019-02-13 LAB — VITAMIN B12: Vitamin B-12: 334 pg/mL (ref 232–1245)

## 2019-02-13 LAB — LIPID PANEL W/O CHOL/HDL RATIO
Cholesterol, Total: 122 mg/dL (ref 100–199)
HDL: 48 mg/dL (ref >39)
LDL Chol Calc (NIH): 56 mg/dL (ref 0–99)
Triglycerides: 94 mg/dL (ref 0–149)
VLDL Cholesterol Cal: 18 mg/dL (ref 5–40)

## 2019-02-13 LAB — TSH: TSH: 0.947 u[IU]/mL (ref 0.450–4.500)

## 2019-02-13 LAB — VITAMIN D 25 HYDROXY (VIT D DEFICIENCY, FRACTURES): Vit D, 25-Hydroxy: 25.4 ng/mL — ABNORMAL LOW (ref 30.0–100.0)

## 2019-02-13 LAB — CBC WITH DIFFERENTIAL/PLATELET

## 2019-02-14 ENCOUNTER — Encounter: Payer: Self-pay | Admitting: Nurse Practitioner

## 2019-02-14 DIAGNOSIS — E559 Vitamin D deficiency, unspecified: Secondary | ICD-10-CM | POA: Insufficient documentation

## 2019-02-14 DIAGNOSIS — E538 Deficiency of other specified B group vitamins: Secondary | ICD-10-CM | POA: Insufficient documentation

## 2019-03-08 ENCOUNTER — Telehealth: Payer: Self-pay | Admitting: Nurse Practitioner

## 2019-03-08 NOTE — Telephone Encounter (Signed)
t called and stated that she had tested positive for covid and should be out of quarantine on 03/10/19. Pt states that she is still not felling better and thinks she has an infection. Pt would like a call back from the nurse. Please advise

## 2019-03-08 NOTE — Telephone Encounter (Signed)
She could do virtual visit if possible this week.

## 2019-03-08 NOTE — Telephone Encounter (Signed)
Scheduled telephone visit for tomorrow 03/09/2019

## 2019-03-09 ENCOUNTER — Other Ambulatory Visit: Payer: Self-pay

## 2019-03-09 ENCOUNTER — Ambulatory Visit (INDEPENDENT_AMBULATORY_CARE_PROVIDER_SITE_OTHER): Payer: Medicare HMO | Admitting: Nurse Practitioner

## 2019-03-09 ENCOUNTER — Telehealth: Payer: Self-pay | Admitting: Nurse Practitioner

## 2019-03-09 ENCOUNTER — Other Ambulatory Visit: Payer: Self-pay | Admitting: Nurse Practitioner

## 2019-03-09 ENCOUNTER — Encounter: Payer: Self-pay | Admitting: Nurse Practitioner

## 2019-03-09 VITALS — Temp 97.3°F | Ht 62.0 in | Wt 240.0 lb

## 2019-03-09 DIAGNOSIS — U071 COVID-19: Secondary | ICD-10-CM | POA: Diagnosis not present

## 2019-03-09 MED ORDER — ALBUTEROL SULFATE (2.5 MG/3ML) 0.083% IN NEBU: 2.5000 mg | INHALATION_SOLUTION | Freq: Four times a day (QID) | RESPIRATORY_TRACT | 1 refills | Status: DC | PRN

## 2019-03-09 MED ORDER — BENZONATATE 100 MG PO CAPS: 100.0000 mg | ORAL_CAPSULE | Freq: Two times a day (BID) | ORAL | 0 refills | Status: DC | PRN

## 2019-03-09 MED ORDER — ALBUTEROL SULFATE HFA 108 (90 BASE) MCG/ACT IN AERS: 2.0000 | INHALATION_SPRAY | Freq: Four times a day (QID) | RESPIRATORY_TRACT | 1 refills | Status: DC | PRN

## 2019-03-09 NOTE — Patient Instructions (Signed)

## 2019-03-09 NOTE — Telephone Encounter (Signed)
Sent in new script for inhaler, which original was supposed to be.  I informed patient and apologized for initial nebulizer script.

## 2019-03-09 NOTE — Telephone Encounter (Signed)
Pt called in stating that she does not have a nebulizer machine for the solution that was sent to pharmacy. She states that [facility] does not have them and is wondering if prescription can be sent elsewhere. Please advise.

## 2019-03-09 NOTE — Progress Notes (Signed)
Temp (!) 97.3 F (36.3 C) (Oral)   Ht 5\' 2"  (1.575 m)   Wt 240 lb (108.9 kg)   BMI 43.90 kg/m    Subjective:    Patient ID: Jillian Pierce, female    DOB: Sep 10, 1955, 63 y.o.   MRN: [id number]  HPI: [name] is a 63 y.o. female  Chief Complaint  Patient presents with  . Nasal Congestion    covid 19 exposure at home with family member. has tried OTM ibuprofen and tylenol  . Diarrhea  . Cough    . This visit was completed via telephone due to the restrictions of the COVID-19 pandemic. All issues as above were discussed and addressed but no physical exam was performed. If it was felt that the patient should be evaluated in the office, they were directed there. The patient verbally consented to this visit. Patient was unable to complete an audio/visual visit due to Technical difficulties,Lack of internet. Due to the catastrophic nature of the COVID-19 pandemic, this visit was done through audio contact only. . Location of the patient: home . Location of the provider: work . Those involved with this call:  . Provider: Marnee Guarneri, DNP . CMA: Lesle Chris, [place] . Front Desk/Registration: Jill Side  . Time spent on call: 15 minutes on the phone discussing health concerns. 10 minutes total spent in review of patient's record and preparation of their chart.  . I verified patient identity using two factors (patient name and date of birth). Patient consents verbally to being seen via telemedicine visit today.    COVID POSITIVE: Has exposure to Covid and was diagnosed last Wednesday as positive, her sister was diagnosed as positive first and they live in same household.  Was tested at [facility].  Currently has cough and sinus symptoms + headache occasionally.  Did have diarrhea initially, but this has improved.  Reports some loss of taste and "a funny taste at times".  States cough is improving.  Overall is improving, but not 100%.  Health department is calling her today for  update. Fever: no Cough: yes Shortness of breath: no Wheezing: no Chest pain: no Chest tightness: no Chest congestion: no Nasal congestion: yes Runny nose: yes Post nasal drip: yes Sneezing: no Sore throat: no Swollen glands: no Sinus pressure: yes Headache: yes Face pain: no Toothache: no Ear pain: none Ear pressure: none Eyes red/itching:no Eye drainage/crusting: no  Vomiting: no Rash: no Fatigue: yes Sick contacts: yes Strep contacts: no  Context: stable Recurrent sinusitis: no Relief with OTC cold/cough medications: yes  Treatments attempted: Flonase, neti pot, Ibuprofen, Tylenol  Relevant past medical, surgical, family and social history reviewed and updated as indicated. Interim medical history since our last visit reviewed. Allergies and medications reviewed and updated.  Review of Systems  Constitutional: Positive for fatigue. Negative for activity change, appetite change, chills, diaphoresis and fever.  HENT: Positive for congestion, postnasal drip, rhinorrhea and sinus pressure. Negative for ear discharge, ear pain, facial swelling, sinus pain, sneezing, sore throat and voice change.   Eyes: Negative for pain and visual disturbance.  Respiratory: Positive for cough. Negative for chest tightness, shortness of breath and wheezing.   Cardiovascular: Negative for chest pain, palpitations and leg swelling.  Gastrointestinal: Negative for abdominal distention, abdominal pain, constipation, diarrhea, nausea and vomiting.  Endocrine: Negative.   Musculoskeletal: Negative for myalgias.  Neurological: Positive for headaches. Negative for dizziness and numbness.  Psychiatric/Behavioral: Negative.     Per HPI unless specifically indicated  above     Objective:    Temp (!) 97.3 F (36.3 C) (Oral)   Ht 5\' 2"  (1.575 m)   Wt 240 lb (108.9 kg)   BMI 43.90 kg/m   Wt Readings from Last 3 Encounters:  03/09/19 240 lb (108.9 kg)  02/12/19 243 lb 3.2 oz (110.3 kg)   11/03/18 244 lb (110.7 kg)    Physical Exam   Unable to perform due to telephone visit only, no SOB noted with talking and no cough noted during conversation.  Results for orders placed or performed in visit on 02/12/19  Bayer DCA Hb A1c Waived  Result Value Ref Range   HB A1C (BAYER DCA - WAIVED) 6.4 <7.0 %  CBC with Differential/Platelet  Result Value Ref Range   WBC CANCELED x10E3/uL  Comprehensive metabolic panel  Result Value Ref Range   Glucose 166 (H) 65 - 99 mg/dL   BUN 13 8 - 27 mg/dL   Creatinine, Ser 0.91 0.57 - 1.00 mg/dL   GFR calc non Af Amer 67 >59 mL/min/1.73   GFR calc Af Amer 77 >59 mL/min/1.73   BUN/Creatinine Ratio 14 12 - 28   Sodium 140 134 - 144 mmol/L   Potassium 4.3 3.5 - 5.2 mmol/L   Chloride 102 96 - 106 mmol/L   CO2 21 20 - 29 mmol/L   Calcium 9.7 8.7 - 10.3 mg/dL   Total Protein 7.4 6.0 - 8.5 g/dL   Albumin 4.1 3.8 - 4.8 g/dL   Globulin, Total 3.3 1.5 - 4.5 g/dL   Albumin/Globulin Ratio 1.2 1.2 - 2.2   Bilirubin Total <0.2 0.0 - 1.2 mg/dL   Alkaline Phosphatase 92 39 - 117 IU/L   AST 14 0 - 40 IU/L   ALT 6 0 - 32 IU/L  Lipid Panel w/o Chol/HDL Ratio  Result Value Ref Range   Cholesterol, Total 122 100 - 199 mg/dL   Triglycerides 94 0 - 149 mg/dL   HDL 48 >39 mg/dL   VLDL Cholesterol Cal 18 5 - 40 mg/dL   LDL Chol Calc (NIH) 56 0 - 99 mg/dL  TSH  Result Value Ref Range   TSH 0.947 0.450 - 4.500 uIU/mL  VITAMIN D 25 Hydroxy (Vit-D Deficiency, Fractures)  Result Value Ref Range   Vit D, 25-Hydroxy 25.4 (L) 30.0 - 100.0 ng/mL  Vitamin B12  Result Value Ref Range   Vitamin B-12 334 232 - 1,245 pg/mL      Assessment & Plan:   Problem List Items Addressed This Visit      Other   COVID-19 virus detected - Primary    Test returned positive on 03/03/2019.  Have recommend strict quarantine at this time for full 14 days, as patient still with symptoms.  Script for Albuterol inhaler PRN and Tessalon sent.  Recommend continue simple treatment  at home, since symptoms are improving.  Diabetic tussin for cough as needed + Tylenol for headache and Flonase for sinus congestion.  Return to office in 2 weeks for f/u virtually.           I discussed the assessment and treatment plan with the patient. The patient was provided an opportunity to ask questions and all were answered. The patient agreed with the plan and demonstrated an understanding of the instructions.   The patient was advised to call back or seek an in-person evaluation if the symptoms worsen or if the condition fails to improve as anticipated.   I provided 15 minutes of time  during this encounter.  Follow up plan: Return in about 2 weeks (around 03/23/2019) for Covid positive follow-up.

## 2019-03-09 NOTE — Assessment & Plan Note (Signed)
Test returned positive on 03/03/2019.  Have recommend strict quarantine at this time for full 14 days, as patient still with symptoms.  Script for Albuterol inhaler PRN and Tessalon sent.  Recommend continue simple treatment at home, since symptoms are improving.  Diabetic tussin for cough as needed + Tylenol for headache and Flonase for sinus congestion.  Return to office in 2 weeks for f/u virtually.

## 2019-03-10 ENCOUNTER — Other Ambulatory Visit: Payer: Self-pay | Admitting: Nurse Practitioner

## 2019-03-10 DIAGNOSIS — I10 Essential (primary) hypertension: Secondary | ICD-10-CM

## 2019-03-10 DIAGNOSIS — E1169 Type 2 diabetes mellitus with other specified complication: Secondary | ICD-10-CM

## 2019-03-10 DIAGNOSIS — E119 Type 2 diabetes mellitus without complications: Secondary | ICD-10-CM

## 2019-03-10 DIAGNOSIS — Z6841 Body Mass Index (BMI) 40.0 and over, adult: Secondary | ICD-10-CM

## 2019-03-25 ENCOUNTER — Encounter: Payer: Self-pay | Admitting: Nurse Practitioner

## 2019-03-25 ENCOUNTER — Ambulatory Visit (INDEPENDENT_AMBULATORY_CARE_PROVIDER_SITE_OTHER): Payer: Medicare HMO | Admitting: Nurse Practitioner

## 2019-03-25 ENCOUNTER — Other Ambulatory Visit: Payer: Self-pay

## 2019-03-25 DIAGNOSIS — U071 COVID-19: Secondary | ICD-10-CM

## 2019-03-25 MED ORDER — FLUTICASONE PROPIONATE 50 MCG/ACT NA SUSP: 2.0000 | Freq: Every day | NASAL | 6 refills | Status: DC

## 2019-03-25 NOTE — Assessment & Plan Note (Signed)
Reports 100% improved at this time.  Will continue to monitor and is aware to return to office for any worsening symptoms.  Is now off quarantine.

## 2019-03-25 NOTE — Patient Instructions (Signed)

## 2019-03-25 NOTE — Progress Notes (Signed)
There were no vitals taken for this visit.   Subjective:    Patient ID: Jillian Pierce, female    DOB: Mar 17, 1956, 63 y.o.   MRN: [id number]  HPI: Jillian Pierce is a 63 y.o. female  Chief Complaint  Patient presents with  . Follow-up    2 week COVID    . This visit was completed via telephone due to the restrictions of the COVID-19 pandemic. All issues as above were discussed and addressed but no physical exam was performed. If it was felt that the patient should be evaluated in the office, they were directed there. The patient verbally consented to this visit. Patient was unable to complete an audio/visual visit due to Lack of equipment. Due to the catastrophic nature of the COVID-19 pandemic, this visit was done through audio contact only. . Location of the patient: home . Location of the provider: work . Those involved with this call:  . Provider: Marnee Guarneri, DNP . CMA: Yvonna Alanis, CMA . Front Desk/Registration: Jill Side  . Time spent on call: 15 minutes on the phone discussing health concerns. 10 minutes total spent in review of patient's record and preparation of their chart.  . I verified patient identity using two factors (patient name and date of birth). Patient consents verbally to being seen via telemedicine visit today.    COVID FOLLOW-UP Initially tested positive 03/02/19.  At this time she reports being 100% better.  Denies cough, fever, chills, SOB, CP, rhinorrhea, congestion.  Has no further concerns.  Reports the health department did call her and released her from quarantine.  Plans on wearing mask on all occasions.  Relevant past medical, surgical, family and social history reviewed and updated as indicated. Interim medical history since our last visit reviewed. Allergies and medications reviewed and updated.  Review of Systems  Constitutional: Negative for activity change, appetite change, diaphoresis, fatigue and fever.  HENT: Negative.    Respiratory: Negative for cough, chest tightness and shortness of breath.   Cardiovascular: Negative for chest pain, palpitations and leg swelling.  Gastrointestinal: Negative for abdominal distention, abdominal pain, constipation, diarrhea, nausea and vomiting.  Neurological: Negative for dizziness, syncope, weakness, light-headedness, numbness and headaches.  Psychiatric/Behavioral: Negative.     Per HPI unless specifically indicated above     Objective:    There were no vitals taken for this visit.  Wt Readings from Last 3 Encounters:  03/09/19 240 lb (108.9 kg)  02/12/19 243 lb 3.2 oz (110.3 kg)  11/03/18 244 lb (110.7 kg)    Physical Exam   Unable to perform due to telephone visit only.  Results for orders placed or performed in visit on 02/12/19  Bayer DCA Hb A1c Waived  Result Value Ref Range   HB A1C (BAYER DCA - WAIVED) 6.4 <7.0 %  CBC with Differential/Platelet  Result Value Ref Range   WBC CANCELED x10E3/uL  Comprehensive metabolic panel  Result Value Ref Range   Glucose 166 (H) 65 - 99 mg/dL   BUN 13 8 - 27 mg/dL   Creatinine, Ser 0.91 0.57 - 1.00 mg/dL   GFR calc non Af Amer 67 >59 mL/min/1.73   GFR calc Af Amer 77 >59 mL/min/1.73   BUN/Creatinine Ratio 14 12 - 28   Sodium 140 134 - 144 mmol/L   Potassium 4.3 3.5 - 5.2 mmol/L   Chloride 102 96 - 106 mmol/L   CO2 21 20 - 29 mmol/L   Calcium 9.7 8.7 - 10.3 mg/dL  Total Protein 7.4 6.0 - 8.5 g/dL   Albumin 4.1 3.8 - 4.8 g/dL   Globulin, Total 3.3 1.5 - 4.5 g/dL   Albumin/Globulin Ratio 1.2 1.2 - 2.2   Bilirubin Total <0.2 0.0 - 1.2 mg/dL   Alkaline Phosphatase 92 39 - 117 IU/L   AST 14 0 - 40 IU/L   ALT 6 0 - 32 IU/L  Lipid Panel w/o Chol/HDL Ratio  Result Value Ref Range   Cholesterol, Total 122 100 - 199 mg/dL   Triglycerides 94 0 - 149 mg/dL   HDL 48 >39 mg/dL   VLDL Cholesterol Cal 18 5 - 40 mg/dL   LDL Chol Calc (NIH) 56 0 - 99 mg/dL  TSH  Result Value Ref Range   TSH 0.947 0.450 - 4.500  uIU/mL  VITAMIN D 25 Hydroxy (Vit-D Deficiency, Fractures)  Result Value Ref Range   Vit D, 25-Hydroxy 25.4 (L) 30.0 - 100.0 ng/mL  Vitamin B12  Result Value Ref Range   Vitamin B-12 334 232 - 1,245 pg/mL      Assessment & Plan:   Problem List Items Addressed This Visit      Other   COVID-19 virus detected    Reports 100% improved at this time.  Will continue to monitor and is aware to return to office for any worsening symptoms.  Is now off quarantine.         I discussed the assessment and treatment plan with the patient. The patient was provided an opportunity to ask questions and all were answered. The patient agreed with the plan and demonstrated an understanding of the instructions.   The patient was advised to call back or seek an in-person evaluation if the symptoms worsen or if the condition fails to improve as anticipated.   I provided 15 minutes of time during this encounter.  Follow up plan: Return for as scheduled in January.

## 2019-05-17 ENCOUNTER — Encounter: Payer: Self-pay | Admitting: Nurse Practitioner

## 2019-05-17 ENCOUNTER — Ambulatory Visit (INDEPENDENT_AMBULATORY_CARE_PROVIDER_SITE_OTHER): Payer: Medicare HMO | Admitting: Nurse Practitioner

## 2019-05-17 DIAGNOSIS — I152 Hypertension secondary to endocrine disorders: Secondary | ICD-10-CM

## 2019-05-17 DIAGNOSIS — I10 Essential (primary) hypertension: Secondary | ICD-10-CM

## 2019-05-17 DIAGNOSIS — E1129 Type 2 diabetes mellitus with other diabetic kidney complication: Secondary | ICD-10-CM

## 2019-05-17 DIAGNOSIS — E1159 Type 2 diabetes mellitus with other circulatory complications: Secondary | ICD-10-CM | POA: Diagnosis not present

## 2019-05-17 DIAGNOSIS — E1169 Type 2 diabetes mellitus with other specified complication: Secondary | ICD-10-CM

## 2019-05-17 DIAGNOSIS — E1140 Type 2 diabetes mellitus with diabetic neuropathy, unspecified: Secondary | ICD-10-CM

## 2019-05-17 DIAGNOSIS — R809 Proteinuria, unspecified: Secondary | ICD-10-CM

## 2019-05-17 DIAGNOSIS — E785 Hyperlipidemia, unspecified: Secondary | ICD-10-CM

## 2019-05-17 DIAGNOSIS — E538 Deficiency of other specified B group vitamins: Secondary | ICD-10-CM

## 2019-05-17 DIAGNOSIS — E559 Vitamin D deficiency, unspecified: Secondary | ICD-10-CM

## 2019-05-17 MED ORDER — AMLODIPINE BESYLATE 10 MG PO TABS: 10.0000 mg | ORAL_TABLET | Freq: Every day | ORAL | 3 refills | Status: DC

## 2019-05-17 MED ORDER — ATORVASTATIN CALCIUM 10 MG PO TABS: 10.0000 mg | ORAL_TABLET | Freq: Every day | ORAL | 3 refills | Status: DC

## 2019-05-17 MED ORDER — CLONIDINE HCL 0.3 MG PO TABS: 0.3000 mg | ORAL_TABLET | Freq: Two times a day (BID) | ORAL | 3 refills | Status: DC

## 2019-05-17 MED ORDER — LISINOPRIL 10 MG PO TABS: 10.0000 mg | ORAL_TABLET | Freq: Every day | ORAL | 3 refills | Status: DC

## 2019-05-17 MED ORDER — HYDROCHLOROTHIAZIDE 25 MG PO TABS: 12.5000 mg | ORAL_TABLET | Freq: Every day | ORAL | 3 refills | Status: DC

## 2019-05-17 MED ORDER — METFORMIN HCL 500 MG PO TABS: ORAL_TABLET | ORAL | 3 refills | Status: DC

## 2019-05-17 NOTE — Assessment & Plan Note (Signed)
Recommend continued focus on health diet choices and regular physical activity (30 minutes 5 days a week).  Focus on small goals at a time.

## 2019-05-17 NOTE — Assessment & Plan Note (Signed)
Ongoing, continue daily supplement and recheck B12 level outpatient.  Is on long term Metformin putting at risk for low B12.

## 2019-05-17 NOTE — Patient Instructions (Signed)
Carbohydrate Counting for Diabetes Mellitus, Adult  Carbohydrate counting is a method of keeping track of how many carbohydrates you eat. Eating carbohydrates naturally increases the amount of sugar (glucose) in the blood. Counting how many carbohydrates you eat helps keep your blood glucose within normal limits, which helps you manage your diabetes (diabetes mellitus). It is important to know how many carbohydrates you can safely have in each meal. This is different for every person. A diet and nutrition specialist (registered dietitian) can help you make a meal plan and calculate how many carbohydrates you should have at each meal and snack. Carbohydrates are found in the following foods:  Grains, such as breads and cereals.  Dried beans and soy products.  Starchy vegetables, such as potatoes, peas, and corn.  Fruit and fruit juices.  Milk and yogurt.  Sweets and snack foods, such as cake, cookies, candy, chips, and soft drinks. How do I count carbohydrates? There are two ways to count carbohydrates in food. You can use either of the methods or a combination of both. Reading "Nutrition Facts" on packaged food The "Nutrition Facts" list is included on the labels of almost all packaged foods and beverages in the [place] It includes:  The serving size.  Information about nutrients in each serving, including the grams (g) of carbohydrate per serving. To use the "Nutrition Facts":  Decide how many servings you will have.  Multiply the number of servings by the number of carbohydrates per serving.  The resulting number is the total amount of carbohydrates that you will be having. Learning standard serving sizes of other foods When you eat carbohydrate foods that are not packaged or do not include "Nutrition Facts" on the label, you need to measure the servings in order to count the amount of carbohydrates:  Measure the foods that you will eat with a food scale or measuring cup, if  needed.  Decide how many standard-size servings you will eat.  Multiply the number of servings by 15. Most carbohydrate-rich foods have about 15 g of carbohydrates per serving. ? For example, if you eat 8 oz (170 g) of strawberries, you will have eaten 2 servings and 30 g of carbohydrates (2 servings x 15 g = 30 g).  For foods that have more than one food mixed, such as soups and casseroles, you must count the carbohydrates in each food that is included. The following list contains standard serving sizes of common carbohydrate-rich foods. Each of these servings has about 15 g of carbohydrates:   hamburger bun or  English muffin.   oz (15 mL) syrup.   oz (14 g) jelly.  1 slice of bread.  1 six-inch tortilla.  3 oz (85 g) cooked rice or pasta.  4 oz (113 g) cooked dried beans.  4 oz (113 g) starchy vegetable, such as peas, corn, or potatoes.  4 oz (113 g) hot cereal.  4 oz (113 g) mashed potatoes or  of a large baked potato.  4 oz (113 g) canned or frozen fruit.  4 oz (120 mL) fruit juice.  4-6 crackers.  6 chicken nuggets.  6 oz (170 g) unsweetened dry cereal.  6 oz (170 g) plain fat-free yogurt or yogurt sweetened with artificial sweeteners.  8 oz (240 mL) milk.  8 oz (170 g) fresh fruit or one small piece of fruit.  24 oz (680 g) popped popcorn. Example of carbohydrate counting Sample meal  3 oz (85 g) chicken breast.  6 oz (170 g)   brown rice.  4 oz (113 g) corn.  8 oz (240 mL) milk.  8 oz (170 g) strawberries with sugar-free whipped topping. Carbohydrate calculation 1. Identify the foods that contain carbohydrates: ? Rice. ? Corn. ? Milk. ? Strawberries. 2. Calculate how many servings you have of each food: ? 2 servings rice. ? 1 serving corn. ? 1 serving milk. ? 1 serving strawberries. 3. Multiply each number of servings by 15 g: ? 2 servings rice x 15 g = 30 g. ? 1 serving corn x 15 g = 15 g. ? 1 serving milk x 15 g = 15 g. ? 1  serving strawberries x 15 g = 15 g. 4. Add together all of the amounts to find the total grams of carbohydrates eaten: ? 30 g + 15 g + 15 g + 15 g = 75 g of carbohydrates total. Summary  Carbohydrate counting is a method of keeping track of how many carbohydrates you eat.  Eating carbohydrates naturally increases the amount of sugar (glucose) in the blood.  Counting how many carbohydrates you eat helps keep your blood glucose within normal limits, which helps you manage your diabetes.  A diet and nutrition specialist (registered dietitian) can help you make a meal plan and calculate how many carbohydrates you should have at each meal and snack. This information is not intended to replace advice given to you by your health care provider. Make sure you discuss any questions you have with your health care provider. Document Revised: 10/31/2016 Document Reviewed: 09/20/2015 Elsevier Patient Education  2020 [employer]  

## 2019-05-17 NOTE — Assessment & Plan Note (Signed)
Chronic, ongoing with recent A1C below goal.  Continue current medication regimen, Lisinopril for kidney protection.  Recommend checking BS three mornings a week and documenting for provider.  Labs outpatient and will adjust regimen as needed. Return in 3 months.

## 2019-05-17 NOTE — Assessment & Plan Note (Signed)
Chronic, ongoing with recent A1C levels below goal.  Continue current medication regimen and adjust as needed.  Outpatient labs to be obtained.  Recheck Vit B12 level and A1C.

## 2019-05-17 NOTE — Assessment & Plan Note (Addendum)
Continue daily supplement and recheck level outpatient.  Due for DEXA starting at age 64.

## 2019-05-17 NOTE — Assessment & Plan Note (Signed)
Chronic, stable with BP at goal on recent visits.  Continue current medication regimen and adjust as needed.  Recommend checking BP at home three mornings a week.  Labs outpatient.  Return in 3 months.

## 2019-05-17 NOTE — Progress Notes (Signed)
There were no vitals taken for this visit.   Subjective:    Patient ID: Jillian Pierce, female    DOB: 04-23-1955, 64 y.o.   MRN: [id number]  HPI: Jillian Pierce is a 64 y.o. female  Chief Complaint  Patient presents with  . Diabetes  . Hypertension    . This visit was completed via telephone due to the restrictions of the COVID-19 pandemic. All issues as above were discussed and addressed but no physical exam was performed. If it was felt that the patient should be evaluated in the office, they were directed there. The patient verbally consented to this visit. Patient was unable to complete an audio/visual visit due to Lack of equipment. Due to the catastrophic nature of the COVID-19 pandemic, this visit was done through audio contact only. . Location of the patient: home . Location of the provider: home . Those involved with this call:  . Provider: Marnee Guarneri, DNP . CMA: Yvonna Alanis, CMA . Front Desk/Registration: Don Perking  . Time spent on call: 15 minutes on the phone discussing health concerns. 10 minutes total spent in review of patient's record and preparation of their chart.  . I verified patient identity using two factors (patient name and date of birth). Patient consents verbally to being seen via telemedicine visit today.   DIABETES Last A1C 6.4% in October 2020. Continues on Metformin 250 MG BID. Hypoglycemic episodes:no Polydipsia/polyuria: no Visual disturbance: no Chest pain: no Paresthesias: no Glucose Monitoring: no             Accucheck frequency: Not Checking             Fasting glucose:             Post prandial:             Evening:             Before meals: Taking Insulin?: no             Long acting insulin:             Short acting insulin: Blood Pressure Monitoring: not checking Retinal Examination: Up to Date Foot Exam: Up to Date Pneumovax: Up to Date Influenza: Up to Date Aspirin: yes   HYPERTENSION /  HYPERLIPIDEMIA Continues on Lipitor and Norvasc, Lisinopril, Clonidine, and HCTZ. Satisfied with current treatment? yes Duration of hypertension: chronic BP monitoring frequency: not checking BP range:  BP medication side effects: no Duration of hyperlipidemia: chronic Cholesterol medication side effects: no Cholesterol supplements: none Medication compliance: good compliance Aspirin: yes Recent stressors: no Recurrent headaches: no Visual changes: no Palpitations: no Dyspnea: no Chest pain: no Lower extremity edema: no Dizzy/lightheaded: no  VITAMIN B12 & D DEFICIENCY Continues on daily supplements.  No recent falls, fractures, mouth pain, neuropathy pain, or memory changes.  Relevant past medical, surgical, family and social history reviewed and updated as indicated. Interim medical history since our last visit reviewed. Allergies and medications reviewed and updated.  Review of Systems  Constitutional: Negative for activity change, appetite change, diaphoresis, fatigue and fever.  Respiratory: Negative for cough, chest tightness and shortness of breath.   Cardiovascular: Negative for chest pain, palpitations and leg swelling.  Gastrointestinal: Negative.   Endocrine: Negative for cold intolerance, heat intolerance, polydipsia, polyphagia and polyuria.  Neurological: Negative.   Psychiatric/Behavioral: Negative.     Per HPI unless specifically indicated above     Objective:    There were no vitals taken for this  visit.  Wt Readings from Last 3 Encounters:  03/09/19 240 lb (108.9 kg)  02/12/19 243 lb 3.2 oz (110.3 kg)  11/03/18 244 lb (110.7 kg)    Physical Exam   Unable to perform due to telephone visit only.  Results for orders placed or performed in visit on 02/12/19  Bayer DCA Hb A1c Waived  Result Value Ref Range   HB A1C (BAYER DCA - WAIVED) 6.4 <7.0 %  CBC with Differential/Platelet  Result Value Ref Range   WBC CANCELED x10E3/uL  Comprehensive  metabolic panel  Result Value Ref Range   Glucose 166 (H) 65 - 99 mg/dL   BUN 13 8 - 27 mg/dL   Creatinine, Ser 0.91 0.57 - 1.00 mg/dL   GFR calc non Af Amer 67 >59 mL/min/1.73   GFR calc Af Amer 77 >59 mL/min/1.73   BUN/Creatinine Ratio 14 12 - 28   Sodium 140 134 - 144 mmol/L   Potassium 4.3 3.5 - 5.2 mmol/L   Chloride 102 96 - 106 mmol/L   CO2 21 20 - 29 mmol/L   Calcium 9.7 8.7 - 10.3 mg/dL   Total Protein 7.4 6.0 - 8.5 g/dL   Albumin 4.1 3.8 - 4.8 g/dL   Globulin, Total 3.3 1.5 - 4.5 g/dL   Albumin/Globulin Ratio 1.2 1.2 - 2.2   Bilirubin Total <0.2 0.0 - 1.2 mg/dL   Alkaline Phosphatase 92 39 - 117 IU/L   AST 14 0 - 40 IU/L   ALT 6 0 - 32 IU/L  Lipid Panel w/o Chol/HDL Ratio  Result Value Ref Range   Cholesterol, Total 122 100 - 199 mg/dL   Triglycerides 94 0 - 149 mg/dL   HDL 48 >39 mg/dL   VLDL Cholesterol Cal 18 5 - 40 mg/dL   LDL Chol Calc (NIH) 56 0 - 99 mg/dL  TSH  Result Value Ref Range   TSH 0.947 0.450 - 4.500 uIU/mL  VITAMIN D 25 Hydroxy (Vit-D Deficiency, Fractures)  Result Value Ref Range   Vit D, 25-Hydroxy 25.4 (L) 30.0 - 100.0 ng/mL  Vitamin B12  Result Value Ref Range   Vitamin B-12 334 232 - 1,245 pg/mL      Assessment & Plan:   Problem List Items Addressed This Visit      Cardiovascular and Mediastinum   Hypertension associated with diabetes ([place])    Chronic, stable with BP at goal on recent visits.  Continue current medication regimen and adjust as needed.  Recommend checking BP at home three mornings a week.  Labs outpatient.  Return in 3 months.      Relevant Medications   metFORMIN (GLUCOPHAGE) 500 MG tablet   lisinopril (ZESTRIL) 10 MG tablet   hydrochlorothiazide (HYDRODIURIL) 25 MG tablet   cloNIDine (CATAPRES) 0.3 MG tablet   atorvastatin (LIPITOR) 10 MG tablet   amLODipine (NORVASC) 10 MG tablet     Endocrine   Type 2 diabetes mellitus with diabetic neuropathy, unspecified (HCC) - Primary    Chronic, ongoing with recent A1C  levels below goal.  Continue current medication regimen and adjust as needed.  Outpatient labs to be obtained.  Recheck Vit B12 level and A1C.      Relevant Medications   metFORMIN (GLUCOPHAGE) 500 MG tablet   lisinopril (ZESTRIL) 10 MG tablet   atorvastatin (LIPITOR) 10 MG tablet   Other Relevant Orders   Bayer DCA Hb A1c Waived   Hyperlipidemia associated with type 2 diabetes mellitus (HCC)    Chronic, ongoing.  Continue  current medication regimen and adjust as needed.  Labs outpatient. Return in 3 months.      Relevant Medications   metFORMIN (GLUCOPHAGE) 500 MG tablet   lisinopril (ZESTRIL) 10 MG tablet   atorvastatin (LIPITOR) 10 MG tablet   Other Relevant Orders   Bayer DCA Hb A1c Waived   Lipid Panel [place], Waived   Comprehensive metabolic panel   Diabetes mellitus with proteinuria (HCC)    Chronic, ongoing with recent A1C below goal.  Continue current medication regimen, Lisinopril for kidney protection.  Recommend checking BS three mornings a week and documenting for provider.  Labs outpatient and will adjust regimen as needed. Return in 3 months.      Relevant Medications   metFORMIN (GLUCOPHAGE) 500 MG tablet   lisinopril (ZESTRIL) 10 MG tablet   atorvastatin (LIPITOR) 10 MG tablet   Other Relevant Orders   Bayer DCA Hb A1c Waived   Microalbumin, Urine Waived     Other   Morbid obesity ([place])    Recommend continued focus on health diet choices and regular physical activity (30 minutes 5 days a week).  Focus on small goals at a time.       Relevant Medications   metFORMIN (GLUCOPHAGE) 500 MG tablet   Vitamin D deficiency    Continue daily supplement and recheck level outpatient.  Due for DEXA starting at age 29.      Relevant Orders   VITAMIN D 25 Hydroxy (Vit-D Deficiency, Fractures)   Vitamin B12 deficiency    Ongoing, continue daily supplement and recheck B12 level outpatient.  Is on long term Metformin putting at risk for low B12.      Relevant Orders    Vitamin B12      I discussed the assessment and treatment plan with the patient. The patient was provided an opportunity to ask questions and all were answered. The patient agreed with the plan and demonstrated an understanding of the instructions.   The patient was advised to call back or seek an in-person evaluation if the symptoms worsen or if the condition fails to improve as anticipated.   I provided 15 minutes of time during this encounter.  Follow up plan: Return in about 3 months (around 08/15/2019) for T2DM, HTN, HLD, Vit b12 and D deficiency.

## 2019-05-17 NOTE — Assessment & Plan Note (Signed)
Chronic, ongoing.  Continue current medication regimen and adjust as needed.  Labs outpatient. Return in 3 months.

## 2019-06-06 ENCOUNTER — Other Ambulatory Visit: Payer: Self-pay | Admitting: Nurse Practitioner

## 2019-06-06 DIAGNOSIS — E1169 Type 2 diabetes mellitus with other specified complication: Secondary | ICD-10-CM

## 2019-06-06 DIAGNOSIS — E1159 Type 2 diabetes mellitus with other circulatory complications: Secondary | ICD-10-CM

## 2019-06-06 DIAGNOSIS — E785 Hyperlipidemia, unspecified: Secondary | ICD-10-CM

## 2019-06-06 DIAGNOSIS — I152 Hypertension secondary to endocrine disorders: Secondary | ICD-10-CM

## 2019-06-17 ENCOUNTER — Ambulatory Visit (INDEPENDENT_AMBULATORY_CARE_PROVIDER_SITE_OTHER): Payer: Medicare HMO

## 2019-06-17 DIAGNOSIS — Z780 Asymptomatic menopausal state: Secondary | ICD-10-CM

## 2019-06-17 DIAGNOSIS — Z0000 Encounter for general adult medical examination without abnormal findings: Secondary | ICD-10-CM | POA: Diagnosis not present

## 2019-06-17 NOTE — Progress Notes (Signed)
Subjective:   Jillian Pierce is a 64 y.o. female who presents for an Initial Medicare Annual Wellness Visit.  This visit is being conducted via phone call  - after an attmept to do on video chat - due to the COVID-19 pandemic. This patient has given me verbal consent via phone to conduct this visit, patient states they are participating from their home address. Some vital signs may be absent or patient reported.   Patient identification: identified by name, DOB, and current address.    Review of Systems      Cardiac Risk Factors include: advanced age (>86men, >56 women);dyslipidemia;hypertension;obesity (BMI >30kg/m2)     Objective:    There were no vitals filed for this visit. There is no height or weight on file to calculate BMI.  Advanced Directives 06/17/2019 11/03/2018 07/15/2018 11/21/2014  Does Patient Have a Medical Advance Directive? No No No No  Would patient like information on creating a medical advance directive? - No - Patient declined - Yes - Educational materials given    Current Medications (verified) Outpatient Encounter Medications as of 06/17/2019  Medication Sig  . albuterol (VENTOLIN HFA) 108 (90 Base) MCG/ACT inhaler Inhale 2 puffs into the lungs every 6 (six) hours as needed for wheezing or shortness of breath.  Marland Kitchen amLODipine (NORVASC) 10 MG tablet Take 1 tablet (10 mg total) by mouth daily.  . Ascorbic Acid (VITAMIN C PO) Take by mouth daily.  Marland Kitchen aspirin EC 81 MG tablet Take 81 mg by mouth daily.  Marland Kitchen atorvastatin (LIPITOR) 10 MG tablet Take 1 tablet (10 mg total) by mouth daily.  . cloNIDine (CATAPRES) 0.3 MG tablet Take 1 tablet (0.3 mg total) by mouth 2 (two) times daily.  . Cyanocobalamin (VITAMIN B 12 PO) Take by mouth daily.  . cyclobenzaprine (FLEXERIL) 5 MG tablet Take 1 tablet (5 mg total) by mouth 2 (two) times daily.  . fluticasone (FLONASE) 50 MCG/ACT nasal spray Place 2 sprays into both nostrils daily.  . hydrochlorothiazide (HYDRODIURIL) 25 MG  tablet Take 0.5 tablets (12.5 mg total) by mouth daily.  Marland Kitchen lisinopril (ZESTRIL) 10 MG tablet Take 1 tablet (10 mg total) by mouth daily.  Marland Kitchen loratadine (CLARITIN) 10 MG tablet Take 1 tablet (10 mg total) by mouth daily.  . metFORMIN (GLUCOPHAGE) 500 MG tablet TAKE ONE-HALF TABLET BY MOUTH TWICE DAILY  . VITAMIN D PO Take by mouth daily.  . benzonatate (TESSALON) 100 MG capsule Take 1 capsule (100 mg total) by mouth 2 (two) times daily as needed for cough. (Patient not taking: Reported on 06/17/2019)  . [DISCONTINUED] HYDROcodone-acetaminophen (NORCO/VICODIN) 5-325 MG tablet hydrocodone 5 mg-acetaminophen 325 mg tablet  Take 1 tablet every 6 hours by oral route as needed.  . [DISCONTINUED] traMADol (ULTRAM) 50 MG tablet Take 50 mg by mouth every 6 (six) hours as needed.   No facility-administered encounter medications on file as of 06/17/2019.    Allergies (verified) Patient has no known allergies.   History: Past Medical History:  Diagnosis Date  . Diabetes mellitus without complication ([place])   . Hypertension   . Left knee pain 05/17/2014  . Morbid obesity ([place]) 02/15/2016   Past Surgical History:  Procedure Laterality Date  . ABDOMINAL HYSTERECTOMY     complete  . ACHILLES TENDON SURGERY Left   . COLONOSCOPY WITH PROPOFOL N/A 11/03/2018   Procedure: COLONOSCOPY WITH PROPOFOL;  Surgeon: Lin Landsman, MD;  [facility] END[facility];  Service: Gastroenterology;  Laterality: N/A;  . HERNIA REPAIR  11/2014  . INSERTION OF MESH N/A 11/23/2014   Procedure: INSERTION OF MESH;  Surgeon: Sherri Rad, MD;  Location: [facility] ORS;  Service: General;  Laterality: N/A;  . JOINT REPLACEMENT Left 12/18/2018  . VENTRAL HERNIA REPAIR N/A 11/23/2014   Procedure: HERNIA REPAIR VENTRAL ADULT;  Surgeon: Sherri Rad, MD;  Location: [facility] ORS;  Service: General;  Laterality: N/A;   Family History  Problem Relation Age of Onset  . Hypertension Mother   . Diabetes Mother   . Kidney disease Mother   .  Hypertension Father   . Diabetes Father   . Heart failure Father   . Hypertension Son   . Heart disease Sister   . Heart disease Brother   . Breast cancer Maternal Aunt    Social History   Socioeconomic History  . Marital status: Single    Spouse name: Not on file  . Number of children: Not on file  . Years of education: Not on file  . Highest education level: Not on file  Occupational History  . Not on file  Tobacco Use  . Smoking status: Never Smoker  . Smokeless tobacco: Never Used  Substance and Sexual Activity  . Alcohol use: No  . Drug use: No  . Sexual activity: Not Currently  Other Topics Concern  . Not on file  Social History Narrative  . Not on file   Social Determinants of Health   Financial Resource Strain: Low Risk   . Difficulty of Paying Living Expenses: Not hard at all  Food Insecurity: No Food Insecurity  . Worried About Charity fundraiser in the Last Year: Never true  . Ran Out of Food in the Last Year: Never true  Transportation Needs: No Transportation Needs  . Lack of Transportation (Medical): No  . Lack of Transportation (Non-Medical): No  Physical Activity: Inactive  . Days of Exercise per Week: 0 days  . Minutes of Exercise per Session: 0 min  Stress: No Stress Concern Present  . Feeling of Stress : Not at all  Social Connections: Unknown  . Frequency of Communication with Friends and Family: More than three times a week  . Frequency of Social Gatherings with Friends and Family: More than three times a week  . Attends Religious Services: 1 to 4 times per year  . Active Member of Clubs or Organizations: No  . Attends Archivist Meetings: Never  . Marital Status: Not on file    Tobacco Counseling Counseling given: Not Answered   Clinical Intake:  Pre-visit preparation completed: Yes  Pain : No/denies pain     Nutritional Status: BMI > 30  Obese Nutritional Risks: None Diabetes: No  How often do you need to have  someone help you when you read instructions, pamphlets, or other written materials from your doctor or pharmacy?: 1 - Never  Interpreter Needed?: No  Information entered by :: Britney Newstrom,LPN   Activities of Daily Living In your present state of health, do you have any difficulty performing the following activities: 06/17/2019 02/12/2019  Hearing? N N  Comment no hearing aids -  Vision? N N  Comment no glasses, [facility] -  Difficulty concentrating or making decisions? N N  Walking or climbing stairs? N Y  Dressing or bathing? N N  Doing errands, shopping? N N  Comment uses transportation bus as needed Facilities manager and eating ? N -  Using the Toilet? N -  In the past six  months, have you accidently leaked urine? N -  Do you have problems with loss of bowel control? N -  Managing your Medications? N -  Managing your Finances? N -  Housekeeping or managing your Housekeeping? N -  Some recent data might be hidden     Immunizations and Health Maintenance Immunization History  Administered Date(s) Administered  . Influenza Inj Mdck Quad Pf 02/15/2016  . Influenza,inj,Quad PF,6+ Mos 01/13/2018, 02/12/2019  . Influenza-Unspecified 02/25/2013, 02/21/2015  . Pneumococcal Polysaccharide-23 01/13/2018   Health Maintenance Due  Topic Date Due  . DEXA SCAN  05/24/2019  . PNA vac Low Risk Adult (1 of 2 - PCV13) 05/24/2019    Patient Care Team: Venita Lick, NP as PCP - General (Nurse Practitioner)  Indicate any recent Medical Services you may have received from other than [facility] providers in the past year (date may be approximate).     Assessment:   This is a routine wellness examination for Jillian Pierce.  Hearing/Vision screen No exam data present  Dietary issues and exercise activities discussed: Current Exercise Habits: The patient does not participate in regular exercise at present, Exercise limited by: None identified  Goals   None    Depression Screen  PHQ 2/9 Scores 06/17/2019 02/12/2019 08/25/2017  PHQ - 2 Score 0 0 0    Fall Risk Fall Risk  06/17/2019 07/03/2018 08/25/2017  Falls in the past year? 0 0 No  Number falls in past yr: 0 - -  Injury with Fall? 0 - -   FALL RISK PREVENTION PERTAINING TO THE HOME:  Any stairs in or around the home? Yes  If so, are there any without handrails? No   Home free of loose throw rugs in walkways, pet beds, electrical cords, etc? Yes  Adequate lighting in your home to reduce risk of falls? Yes   ASSISTIVE DEVICES UTILIZED TO PREVENT FALLS:  Life alert? No  Use of a cane, walker or w/c? Yes  cane  Grab bars in the bathroom? No  Shower chair or bench in shower? No  Elevated toilet seat or a handicapped toilet? No    DME ORDERS:  DME order needed?  No   TIMED UP AND GO:  Unable to perform     Cognitive Function:        Screening Tests Health Maintenance  Topic Date Due  . DEXA SCAN  05/24/2019  . PNA vac Low Risk Adult (1 of 2 - PCV13) 05/24/2019  . TETANUS/TDAP  02/12/2020 (Originally 05/[birth year]/1973)  . Hepatitis C Screening  02/12/2020 (Originally Apr 19, 1956)  . HIV Screening  02/12/2020 (Originally 05/23/1969)  . HEMOGLOBIN A1C  08/13/2019  . OPHTHALMOLOGY EXAM  01/19/2020  . FOOT EXAM  02/12/2020  . MAMMOGRAM  10/20/2020  . COLONOSCOPY  11/02/2025  . INFLUENZA VACCINE  Completed    Qualifies for Shingles Vaccine? Yes  Zostavax completed n/a. Due for Shingrix. Education has been provided regarding the importance of this vaccine. Pt has been advised to call insurance company to determine out of pocket expense. Advised may also receive vaccine at local pharmacy or Health Dept. Verbalized acceptance and understanding.  Tdap: Discussed need for TD/TDAP vaccine, patient verbalized understanding that this is not covered as a preventative with there insurance and to call the office if she develops any new skin injuries, ie: cuts, scrapes, bug bites, or open wounds. Flu Vaccine: up to date    Pneumococcal Vaccine: Due for Pneumococcal vaccine.discussed series, will get in April.  Covid-19: information provided.  Cancer Screenings:  Colorectal Screening: Completed 11/03/2018. Repeat every 7 years  Mammogram: Completed 10/21/2018. Repeat every year  Bone Density: ordered.   Lung Cancer Screening: (Low Dose CT Chest recommended if Age 54-80 years, 30 pack-year currently smoking OR have quit w/in 15years.) does not qualify.   Lung Cancer Screening Referral: An Epic message has been sent to Burgess Estelle, RN (Oncology Nurse Navigator) regarding the possible need for this exam. Raquel Sarna will review the patient's chart to determine if the patient truly qualifies for the exam. If the patient qualifies, Raquel Sarna will order the Low Dose CT of the chest to facilitate the scheduling of this exam.  Additional Screening:  Hepatitis C Screening: does qualify  Vision Screening: Recommended annual ophthalmology exams for early detection of glaucoma and other disorders of the eye. Is the patient up to date with their annual eye exam?  Yes  Who is the provider or what is the name of the office in which the pt attends annual eye exams? [facility]    Dental Screening: Recommended annual dental exams for proper oral hygiene  Community Resource Referral:  CRR required this visit?  No       Plan:  I have personally reviewed and addressed the Medicare Annual Wellness questionnaire and have noted the following in the patient's chart:  A. Medical and social history B. Use of alcohol, tobacco or illicit drugs  C. Current medications and supplements D. Functional ability and status E.  Nutritional status F.  Physical activity G. Advance directives H. List of other physicians I.  Hospitalizations, surgeries, and ER visits in previous 12 months J.  [place] such as hearing and vision if needed, cognitive and depression L. Referrals and appointments   In addition, I have  reviewed and discussed with patient certain preventive protocols, quality metrics, and best practice recommendations. A written personalized care plan for preventive services as well as general preventive health recommendations were provided to patient.   Signed,    Bevelyn Ngo, LPN   [id number]  Nurse Health Advisor   Nurse Notes: none

## 2019-07-26 LAB — HM DIABETES EYE EXAM

## 2019-08-20 ENCOUNTER — Ambulatory Visit (INDEPENDENT_AMBULATORY_CARE_PROVIDER_SITE_OTHER): Payer: Medicare HMO | Admitting: Nurse Practitioner

## 2019-08-20 ENCOUNTER — Other Ambulatory Visit: Payer: Self-pay

## 2019-08-20 ENCOUNTER — Encounter: Payer: Self-pay | Admitting: Nurse Practitioner

## 2019-08-20 VITALS — BP 132/80 | HR 86 | Temp 98.7°F | Wt 248.4 lb

## 2019-08-20 DIAGNOSIS — E1140 Type 2 diabetes mellitus with diabetic neuropathy, unspecified: Secondary | ICD-10-CM

## 2019-08-20 DIAGNOSIS — E1159 Type 2 diabetes mellitus with other circulatory complications: Secondary | ICD-10-CM | POA: Diagnosis not present

## 2019-08-20 DIAGNOSIS — E1129 Type 2 diabetes mellitus with other diabetic kidney complication: Secondary | ICD-10-CM

## 2019-08-20 DIAGNOSIS — E785 Hyperlipidemia, unspecified: Secondary | ICD-10-CM

## 2019-08-20 DIAGNOSIS — E559 Vitamin D deficiency, unspecified: Secondary | ICD-10-CM

## 2019-08-20 DIAGNOSIS — E1169 Type 2 diabetes mellitus with other specified complication: Secondary | ICD-10-CM | POA: Diagnosis not present

## 2019-08-20 DIAGNOSIS — Z6841 Body Mass Index (BMI) 40.0 and over, adult: Secondary | ICD-10-CM | POA: Insufficient documentation

## 2019-08-20 DIAGNOSIS — Z780 Asymptomatic menopausal state: Secondary | ICD-10-CM

## 2019-08-20 DIAGNOSIS — E538 Deficiency of other specified B group vitamins: Secondary | ICD-10-CM

## 2019-08-20 DIAGNOSIS — I10 Essential (primary) hypertension: Secondary | ICD-10-CM

## 2019-08-20 DIAGNOSIS — I152 Hypertension secondary to endocrine disorders: Secondary | ICD-10-CM

## 2019-08-20 DIAGNOSIS — R809 Proteinuria, unspecified: Secondary | ICD-10-CM

## 2019-08-20 LAB — MICROALBUMIN, URINE WAIVED
Creatinine, Urine Waived: 100 mg/dL (ref 10–300)
Microalb, Ur Waived: 30 mg/L — ABNORMAL HIGH (ref 0–19)
Microalb/Creat Ratio: <30 mg/g (ref <30)

## 2019-08-20 LAB — LIPID PANEL PICCOLO, WAIVED
Chol/HDL Ratio Piccolo,Waive: 2.4 mg/dL
Cholesterol Piccolo, Waived: 121 mg/dL (ref <200)
HDL Chol Piccolo, Waived: 50 mg/dL — ABNORMAL LOW (ref >59)
LDL Chol Calc Piccolo Waived: 50 mg/dL (ref <100)
Triglycerides Piccolo,Waived: 105 mg/dL (ref <150)
VLDL Chol Calc Piccolo,Waive: 21 mg/dL (ref <30)

## 2019-08-20 LAB — BAYER DCA HB A1C WAIVED: HB A1C (BAYER DCA - WAIVED): 6.5 % (ref <7.0)

## 2019-08-20 NOTE — Assessment & Plan Note (Signed)
Chronic, ongoing with recent A1C levels below goal.  Continue current medication regimen and adjust as needed. Obtain A1C today and will adjust regimen as needed based on results.   Recheck Vit B12 level and continue supplement.  Consider Gabapentin if worsening pain presents.

## 2019-08-20 NOTE — Patient Instructions (Signed)

## 2019-08-20 NOTE — Assessment & Plan Note (Signed)
Recommended eating smaller high protein, low fat meals more frequently and exercising 30 mins a day 5 times a week with a goal of 10-15lb weight loss in the next 3 months. Patient voiced their understanding and motivation to adhere to these recommendations.  

## 2019-08-20 NOTE — Assessment & Plan Note (Signed)
Ongoing, recheck B12 level today and continue supplement.

## 2019-08-20 NOTE — Assessment & Plan Note (Signed)
Ongoing, continue daily supplement and recheck Vit D level today.  DEXA scan ordered.

## 2019-08-20 NOTE — Assessment & Plan Note (Signed)
Chronic, ongoing with recent A1C below goal.  Continue current medication regimen, Lisinopril for kidney protection.  Recommend checking BS three mornings a week and documenting for provider.  A1C and urine micro today.  Will adjust regimen as needed based on results.  Return in 3 months.

## 2019-08-20 NOTE — Assessment & Plan Note (Signed)
Chronic, ongoing.  BP initially elevated on exam today with gradual reduction to goal on checks.  Continue current medication regimen and adjust as needed.  Recommend checking BP at home three mornings a week.  CMP today.  Return in 3 months.

## 2019-08-20 NOTE — Assessment & Plan Note (Signed)
Chronic, ongoing.  Continue current medication regimen and adjust as needed.  Lipid panel. Return in 3 months.

## 2019-08-20 NOTE — Progress Notes (Signed)
BP 132/80 (BP Location: Left Arm)   Pulse 86   Temp 98.7 F (37.1 C) (Oral)   Wt 248 lb 6.4 oz (112.7 kg)   SpO2 96%   BMI 45.43 kg/m    Subjective:    Patient ID: Jillian Pierce, female    DOB: 1955-05-15, 64 y.o.   MRN: [id number]  HPI: Jillian Pierce is a 64 y.o. female  Chief Complaint  Patient presents with  . Diabetes  . Hyperlipidemia  . Hypertension   DIABETES Last A1C 6.4% in October 2020. Continues on Metformin 250 MG BID. Hypoglycemic episodes:no Polydipsia/polyuria: no Visual disturbance: no Chest pain: no Paresthesias: no Glucose Monitoring: no             Accucheck frequency: Not Checking             Fasting glucose:             Post prandial:             Evening:             Before meals: Taking Insulin?: no             Long acting insulin:             Short acting insulin: Blood Pressure Monitoring: not checking Retinal Examination: Up to Date Foot Exam: Up to Date Pneumovax: Up to Date Influenza: Up to Date Aspirin: yes   HYPERTENSION / HYPERLIPIDEMIA Continues on Lipitor and Norvasc, Lisinopril, Clonidine, and HCTZ. Satisfied with current treatment? yes Duration of hypertension: chronic BP monitoring frequency: not checking BP range:  BP medication side effects: no Duration of hyperlipidemia: chronic Cholesterol medication side effects: no Cholesterol supplements: none Medication compliance: good compliance Aspirin: yes Recent stressors: no Recurrent headaches: no Visual changes: no Palpitations: no Dyspnea: no Chest pain: no Lower extremity edema: no Dizzy/lightheaded: no  VITAMIN B12 & D DEFICIENCY Continues on daily supplements.  No recent falls, fractures, mouth pain, neuropathy pain, or memory changes.  Relevant past medical, surgical, family and social history reviewed and updated as indicated. Interim medical history since our last visit reviewed. Allergies and medications reviewed and updated.  Review of Systems    Constitutional: Negative for activity change, appetite change, diaphoresis, fatigue and fever.  Respiratory: Negative for cough, chest tightness and shortness of breath.   Cardiovascular: Negative for chest pain, palpitations and leg swelling.  Gastrointestinal: Negative.   Endocrine: Negative for cold intolerance, heat intolerance, polydipsia, polyphagia and polyuria.  Neurological: Negative.   Psychiatric/Behavioral: Negative.     Per HPI unless specifically indicated above     Objective:    BP 132/80 (BP Location: Left Arm)   Pulse 86   Temp 98.7 F (37.1 C) (Oral)   Wt 248 lb 6.4 oz (112.7 kg)   SpO2 96%   BMI 45.43 kg/m   Wt Readings from Last 3 Encounters:  08/20/19 248 lb 6.4 oz (112.7 kg)  03/09/19 240 lb (108.9 kg)  02/12/19 243 lb 3.2 oz (110.3 kg)    Physical Exam Vitals and nursing note reviewed.  Constitutional:      General: She is awake. She is not in acute distress.    Appearance: She is well-developed and well-groomed. She is morbidly obese. She is not ill-appearing.  HENT:     Head: Normocephalic.     Right Ear: Hearing normal.     Left Ear: Hearing normal.  Eyes:     General: Lids are normal.  Right eye: No discharge.        Left eye: No discharge.     Conjunctiva/sclera: Conjunctivae normal.     Pupils: Pupils are equal, round, and reactive to light.  Neck:     Thyroid: No thyromegaly.     Vascular: No carotid bruit.  Cardiovascular:     Rate and Rhythm: Normal rate and regular rhythm.     Heart sounds: Normal heart sounds. No murmur. No gallop.   Pulmonary:     Effort: Pulmonary effort is normal. No accessory muscle usage or respiratory distress.     Breath sounds: Normal breath sounds.  Abdominal:     General: Bowel sounds are normal.     Palpations: Abdomen is soft.  Musculoskeletal:     Cervical back: Normal range of motion and neck supple.     Right lower leg: No edema.     Left lower leg: No edema.  Skin:    General: Skin is  warm and dry.  Neurological:     Mental Status: She is alert and oriented to person, place, and time.  Psychiatric:        Attention and Perception: Attention normal.        Mood and Affect: Mood normal.        Speech: Speech normal.        Behavior: Behavior normal. Behavior is cooperative.        Thought Content: Thought content normal.     Results for orders placed or performed in visit on 07/28/19  HM DIABETES EYE EXAM  Result Value Ref Range   HM Diabetic Eye Exam No Retinopathy No Retinopathy      Assessment & Plan:   Problem List Items Addressed This Visit      Cardiovascular and Mediastinum   Hypertension associated with diabetes ([place])    Chronic, ongoing.  BP initially elevated on exam today with gradual reduction to goal on checks.  Continue current medication regimen and adjust as needed.  Recommend checking BP at home three mornings a week.  CMP today.  Return in 3 months.        Endocrine   Type 2 diabetes mellitus with diabetic neuropathy, unspecified ([place]) - Primary    Chronic, ongoing with recent A1C levels below goal.  Continue current medication regimen and adjust as needed. Obtain A1C today and will adjust regimen as needed based on results.   Recheck Vit B12 level and continue supplement.  Consider Gabapentin if worsening pain presents.      Hyperlipidemia associated with type 2 diabetes mellitus ([facility])    Chronic, ongoing.  Continue current medication regimen and adjust as needed.  Lipid panel. Return in 3 months.      Diabetes mellitus with proteinuria ([facility])    Chronic, ongoing with recent A1C below goal.  Continue current medication regimen, Lisinopril for kidney protection.  Recommend checking BS three mornings a week and documenting for provider.  A1C and urine micro today.  Will adjust regimen as needed based on results.  Return in 3 months.        Other   Vitamin B12 deficiency    Ongoing, recheck B12 level today and continue supplement.       Morbid obesity ([place])    Recommended eating smaller high protein, low fat meals more frequently and exercising 30 mins a day 5 times a week with a goal of 10-15lb weight loss in the next 3 months. Patient voiced their understanding and motivation to  adhere to these recommendations.       Vitamin D deficiency    Ongoing, continue daily supplement and recheck Vit D level today.  DEXA scan ordered.       Other Visit Diagnoses    Postmenopausal estrogen deficiency       DEXA scan ordered       Follow up plan: Return in about 3 months (around 11/19/2019) for T2DM, HTN/HLD, Arthritis.

## 2019-08-21 LAB — COMPREHENSIVE METABOLIC PANEL
ALT: 9 IU/L (ref 0–32)
AST: 16 IU/L (ref 0–40)
Albumin/Globulin Ratio: 1.6 (ref 1.2–2.2)
Albumin: 4.5 g/dL (ref 3.8–4.8)
Alkaline Phosphatase: 92 IU/L (ref 39–117)
BUN/Creatinine Ratio: 15 (ref 12–28)
BUN: 12 mg/dL (ref 8–27)
Bilirubin Total: 0.3 mg/dL (ref 0.0–1.2)
CO2: 23 mmol/L (ref 20–29)
Calcium: 9.8 mg/dL (ref 8.7–10.3)
Chloride: 104 mmol/L (ref 96–106)
Creatinine, Ser: 0.78 mg/dL (ref 0.57–1.00)
GFR calc Af Amer: 92 mL/min/{1.73_m2} (ref >59)
GFR calc non Af Amer: 80 mL/min/{1.73_m2} (ref >59)
Globulin, Total: 2.9 g/dL (ref 1.5–4.5)
Glucose: 78 mg/dL (ref 65–99)
Potassium: 4.2 mmol/L (ref 3.5–5.2)
Sodium: 142 mmol/L (ref 134–144)
Total Protein: 7.4 g/dL (ref 6.0–8.5)

## 2019-08-21 LAB — VITAMIN B12: Vitamin B-12: 1108 pg/mL (ref 232–1245)

## 2019-08-21 LAB — VITAMIN D 25 HYDROXY (VIT D DEFICIENCY, FRACTURES): Vit D, 25-Hydroxy: 32.8 ng/mL (ref 30.0–100.0)

## 2019-08-21 NOTE — Progress Notes (Signed)
Please let Jillian Pierce know all of her labs have returned: - Vitamin D and B12 levels have improved, continue daily supplements - A1C 6.5%, continue Metformin - Cholesterol, kidney, and liver function all in good ranges You are doing a great job. Keep up the good work!!

## 2019-11-29 ENCOUNTER — Ambulatory Visit: Payer: Medicare HMO | Admitting: Nurse Practitioner

## 2019-12-08 ENCOUNTER — Other Ambulatory Visit: Payer: Self-pay | Admitting: Nurse Practitioner

## 2019-12-08 DIAGNOSIS — Z1231 Encounter for screening mammogram for malignant neoplasm of breast: Secondary | ICD-10-CM

## 2019-12-28 ENCOUNTER — Other Ambulatory Visit: Payer: Self-pay

## 2019-12-28 ENCOUNTER — Encounter: Payer: Self-pay | Admitting: Nurse Practitioner

## 2019-12-28 ENCOUNTER — Ambulatory Visit (INDEPENDENT_AMBULATORY_CARE_PROVIDER_SITE_OTHER): Payer: Medicare HMO | Admitting: Nurse Practitioner

## 2019-12-28 VITALS — BP 118/80 | HR 63 | Temp 98.3°F | Wt 263.0 lb

## 2019-12-28 DIAGNOSIS — E1159 Type 2 diabetes mellitus with other circulatory complications: Secondary | ICD-10-CM

## 2019-12-28 DIAGNOSIS — Z23 Encounter for immunization: Secondary | ICD-10-CM

## 2019-12-28 DIAGNOSIS — E1140 Type 2 diabetes mellitus with diabetic neuropathy, unspecified: Secondary | ICD-10-CM | POA: Diagnosis not present

## 2019-12-28 DIAGNOSIS — R0602 Shortness of breath: Secondary | ICD-10-CM

## 2019-12-28 DIAGNOSIS — E1169 Type 2 diabetes mellitus with other specified complication: Secondary | ICD-10-CM | POA: Diagnosis not present

## 2019-12-28 DIAGNOSIS — I152 Hypertension secondary to endocrine disorders: Secondary | ICD-10-CM

## 2019-12-28 DIAGNOSIS — E785 Hyperlipidemia, unspecified: Secondary | ICD-10-CM

## 2019-12-28 DIAGNOSIS — I10 Essential (primary) hypertension: Secondary | ICD-10-CM

## 2019-12-28 LAB — BAYER DCA HB A1C WAIVED: HB A1C (BAYER DCA - WAIVED): 6.6 % (ref <7.0)

## 2019-12-28 MED ORDER — FUROSEMIDE 20 MG PO TABS: 20.0000 mg | ORAL_TABLET | Freq: Every day | ORAL | 4 refills | Status: DC

## 2019-12-28 NOTE — Assessment & Plan Note (Signed)
Chronic, ongoing with recent A1C levels below goal, today 6.6%.  Continue current medication regimen and adjust as needed. Continue daily B12 supplement for low levels in past.  Consider Gabapentin if worsening pain presents.

## 2019-12-28 NOTE — Assessment & Plan Note (Signed)
Over past two months with edema present.  No red flag symptoms.  Suspect related to 20 pound weight gain due to dietary indiscretions over past months. Recommend she focus on DASH diet, reducing sodium and calorie intake at home, + focus on regular activity at least 30 minutes 5 days a week.  Will trial change from HCTZ to Lasix.  Check BNP, BMP, TSH on labs today.  Continue Albuterol as needed.  Recommend compression hose daily at home.  If ongoing symptoms or abnormal BNP, consider echo.  Return in 6 weeks for follow-up, sooner if worsening.

## 2019-12-28 NOTE — Assessment & Plan Note (Signed)
BMI 48.10 with T2DM, HTN.  Recommended eating smaller high protein, low fat meals more frequently and exercising 30 mins a day 5 times a week with a goal of 10-15lb weight loss in the next 3 months. Patient voiced their understanding and motivation to adhere to these recommendations.

## 2019-12-28 NOTE — Patient Instructions (Addendum)
HYDROCORTISONE CREAM AND AQUAPHOR  DASH Eating Plan DASH stands for "Dietary Approaches to Stop Hypertension." The DASH eating plan is a healthy eating plan that has been shown to reduce high blood pressure (hypertension). It may also reduce your risk for type 2 diabetes, heart disease, and stroke. The DASH eating plan may also help with weight loss. What are tips for following this plan?  General guidelines  Avoid eating more than 2,300 mg (milligrams) of salt (sodium) a day. If you have hypertension, you may need to reduce your sodium intake to 1,500 mg a day.  Limit alcohol intake to no more than 1 drink a day for nonpregnant women and 2 drinks a day for men. One drink equals 12 oz of beer, 5 oz of wine, or 1 oz of hard liquor.  Work with your health care provider to maintain a healthy body weight or to lose weight. Ask what an ideal weight is for you.  Get at least 30 minutes of exercise that causes your heart to beat faster (aerobic exercise) most days of the week. Activities may include walking, swimming, or biking.  Work with your health care provider or diet and nutrition specialist (dietitian) to adjust your eating plan to your individual calorie needs. Reading food labels   Check food labels for the amount of sodium per serving. Choose foods with less than 5 percent of the Daily Value of sodium. Generally, foods with less than 300 mg of sodium per serving fit into this eating plan.  To find whole grains, look for the word "whole" as the first word in the ingredient list. Shopping  Buy products labeled as "low-sodium" or "no salt added."  Buy fresh foods. Avoid canned foods and premade or frozen meals. Cooking  Avoid adding salt when cooking. Use salt-free seasonings or herbs instead of table salt or sea salt. Check with your health care provider or pharmacist before using salt substitutes.  Do not fry foods. Cook foods using healthy methods such as baking, boiling,  grilling, and broiling instead.  Cook with heart-healthy oils, such as olive, canola, soybean, or sunflower oil. Meal planning  Eat a balanced diet that includes: ? 5 or more servings of fruits and vegetables each day. At each meal, try to fill half of your plate with fruits and vegetables. ? Up to 6-8 servings of whole grains each day. ? Less than 6 oz of lean meat, poultry, or fish each day. A 3-oz serving of meat is about the same size as a deck of cards. One egg equals 1 oz. ? 2 servings of low-fat dairy each day. ? A serving of nuts, seeds, or beans 5 times each week. ? Heart-healthy fats. Healthy fats called Omega-3 fatty acids are found in foods such as flaxseeds and coldwater fish, like sardines, salmon, and mackerel.  Limit how much you eat of the following: ? Canned or prepackaged foods. ? Food that is high in trans fat, such as fried foods. ? Food that is high in saturated fat, such as fatty meat. ? Sweets, desserts, sugary drinks, and other foods with added sugar. ? Full-fat dairy products.  Do not salt foods before eating.  Try to eat at least 2 vegetarian meals each week.  Eat more home-cooked food and less restaurant, buffet, and fast food.  When eating at a restaurant, ask that your food be prepared with less salt or no salt, if possible. What foods are recommended? The items listed may not be a complete list.  Talk with your dietitian about what dietary choices are best for you. Grains Whole-grain or whole-wheat bread. Whole-grain or whole-wheat pasta. Brown rice. [place]. Bulgur. Whole-grain and low-sodium cereals. Pita bread. Low-fat, low-sodium crackers. Whole-wheat flour tortillas. Vegetables Fresh or frozen vegetables (raw, steamed, roasted, or grilled). Low-sodium or reduced-sodium tomato and vegetable juice. Low-sodium or reduced-sodium tomato sauce and tomato paste. Low-sodium or reduced-sodium canned vegetables. Fruits All fresh, dried, or frozen  fruit. Canned fruit in natural juice (without added sugar). Meat and other protein foods Skinless chicken or [place]. Ground chicken or [place]. Pork with fat trimmed off. Fish and seafood. Egg whites. Dried beans, peas, or lentils. Unsalted nuts, nut butters, and seeds. Unsalted canned beans. Lean cuts of beef with fat trimmed off. Low-sodium, lean deli meat. Dairy Low-fat (1%) or fat-free (skim) milk. Fat-free, low-fat, or reduced-fat cheeses. Nonfat, low-sodium ricotta or cottage cheese. Low-fat or nonfat yogurt. Low-fat, low-sodium cheese. Fats and oils Soft margarine without trans fats. Vegetable oil. Low-fat, reduced-fat, or light mayonnaise and salad dressings (reduced-sodium). Canola, safflower, olive, soybean, and sunflower oils. Avocado. Seasoning and other foods Herbs. Spices. Seasoning mixes without salt. Unsalted popcorn and pretzels. Fat-free sweets. What foods are not recommended? The items listed may not be a complete list. Talk with your dietitian about what dietary choices are best for you. Grains Baked goods made with fat, such as croissants, muffins, or some breads. Dry pasta or rice meal packs. Vegetables Creamed or fried vegetables. Vegetables in a cheese sauce. Regular canned vegetables (not low-sodium or reduced-sodium). Regular canned tomato sauce and paste (not low-sodium or reduced-sodium). Regular tomato and vegetable juice (not low-sodium or reduced-sodium). Angie Fava. Olives. Fruits Canned fruit in a light or heavy syrup. Fried fruit. Fruit in cream or butter sauce. Meat and other protein foods Fatty cuts of meat. Ribs. Fried meat. Berniece Salines. Sausage. Bologna and other processed lunch meats. Salami. Fatback. Hotdogs. Bratwurst. Salted nuts and seeds. Canned beans with added salt. Canned or smoked fish. Whole eggs or egg yolks. Chicken or [place] with skin. Dairy Whole or 2% milk, cream, and half-and-half. Whole or full-fat cream cheese. Whole-fat or sweetened yogurt. Full-fat  cheese. Nondairy creamers. Whipped toppings. Processed cheese and cheese spreads. Fats and oils Butter. Stick margarine. Lard. Shortening. Ghee. Bacon fat. Tropical oils, such as coconut, palm kernel, or palm oil. Seasoning and other foods Salted popcorn and pretzels. Onion salt, garlic salt, seasoned salt, table salt, and sea salt. Worcestershire sauce. Tartar sauce. Barbecue sauce. Teriyaki sauce. Soy sauce, including reduced-sodium. Steak sauce. Canned and packaged gravies. Fish sauce. Oyster sauce. Cocktail sauce. Horseradish that you find on the shelf. Ketchup. Mustard. Meat flavorings and tenderizers. Bouillon cubes. Hot sauce and Tabasco sauce. Premade or packaged marinades. Premade or packaged taco seasonings. Relishes. Regular salad dressings. Where to find more information:  National Heart, Lung, and [place]: https://wilson-eaton.com/  American Heart Association: www.heart.org Summary  The DASH eating plan is a healthy eating plan that has been shown to reduce high blood pressure (hypertension). It may also reduce your risk for type 2 diabetes, heart disease, and stroke.  With the DASH eating plan, you should limit salt (sodium) intake to 2,300 mg a day. If you have hypertension, you may need to reduce your sodium intake to 1,500 mg a day.  When on the DASH eating plan, aim to eat more fresh fruits and vegetables, whole grains, lean proteins, low-fat dairy, and heart-healthy fats.  Work with your health care provider or diet and nutrition specialist (dietitian) to adjust  your eating plan to your individual calorie needs. This information is not intended to replace advice given to you by your health care provider. Make sure you discuss any questions you have with your health care provider. Document Revised: 03/21/2017 Document Reviewed: 04/01/2016 Elsevier Patient Education  2020 [employer].

## 2019-12-28 NOTE — Progress Notes (Signed)
BP 118/80   Pulse 63   Temp 98.3 F (36.8 C) (Oral)   Wt 263 lb (119.3 kg)   SpO2 95%   BMI 48.10 kg/m    Subjective:    Patient ID: Jillian Pierce, female    DOB: 1955-10-10, 64 y.o.   MRN: [id number]  HPI: Jillian Pierce is a 64 y.o. female  Chief Complaint  Patient presents with  . Diabetes  . Hypertension  . Hyperlipidemia   DIABETES Last A1C 6.5% in April. Continues on Metformin 250 MG BID. Hypoglycemic episodes:no Polydipsia/polyuria: no Visual disturbance: no Chest pain: no Paresthesias: no Glucose Monitoring: no             Accucheck frequency: Not Checking             Fasting glucose:             Post prandial:             Evening:             Before meals: Taking Insulin?: no             Long acting insulin:             Short acting insulin: Blood Pressure Monitoring: not checking Retinal Examination: Up to Date Foot Exam: Up to Date Pneumovax: Up to Date Influenza: Up to Date Aspirin: yes   HYPERTENSION / HYPERLIPIDEMIA Continues on Lipitor and Norvasc, Lisinopril, Clonidine, and HCTZ.  Does endorse some increased edema and SOB since weight gain over past months.  SOB she notices with longer distances, like going to [place], and when going up stairs.  Does notice it a little with shorter distances, but not as bad.  At night she sleeps on two pillows, this is baseline for her no increase.  Does endorse a little bit of wheezing with walking -- uses Albuterol which offers benefit.  Albuterol also helps when SOB present.  Has been trying to focus on diet and not eating as many salty foods, had been eating more junk food and frozen dinners over past months.  Swelling improves with elevation -- ordered compression hose and plans to wear them.   Satisfied with current treatment? yes Duration of hypertension: chronic BP monitoring frequency: not checking BP range:  BP medication side effects: no Duration of hyperlipidemia: chronic Cholesterol medication side  effects: no Cholesterol supplements: none Medication compliance: good compliance Aspirin: yes Recent stressors: no Recurrent headaches: no Visual changes: no Palpitations: no Dyspnea: over past months with weight gain -- notices it with long distance Chest pain: no Lower extremity edema: over past months with weight gain Dizzy/lightheaded: no  Relevant past medical, surgical, family and social history reviewed and updated as indicated. Interim medical history since our last visit reviewed. Allergies and medications reviewed and updated.  Review of Systems  Constitutional: Negative for activity change, appetite change, diaphoresis, fatigue and fever.  Respiratory: Positive for shortness of breath and wheezing. Negative for cough and chest tightness.   Cardiovascular: Positive for leg swelling. Negative for chest pain and palpitations.  Gastrointestinal: Negative.   Endocrine: Negative for cold intolerance, heat intolerance, polydipsia, polyphagia and polyuria.  Neurological: Negative.   Psychiatric/Behavioral: Negative.     Per HPI unless specifically indicated above     Objective:    BP 118/80   Pulse 63   Temp 98.3 F (36.8 C) (Oral)   Wt 263 lb (119.3 kg)   SpO2 95%   BMI 48.10 kg/m  Wt Readings from Last 3 Encounters:  12/28/19 263 lb (119.3 kg)  08/20/19 248 lb 6.4 oz (112.7 kg)  03/09/19 240 lb (108.9 kg)    Physical Exam Vitals and nursing note reviewed.  Constitutional:      General: She is awake. She is not in acute distress.    Appearance: She is well-developed and well-groomed. She is morbidly obese. She is not ill-appearing.  HENT:     Head: Normocephalic.     Right Ear: Hearing normal.     Left Ear: Hearing normal.  Eyes:     General: Lids are normal.        Right eye: No discharge.        Left eye: No discharge.     Conjunctiva/sclera: Conjunctivae normal.     Pupils: Pupils are equal, round, and reactive to light.  Neck:     Thyroid: No  thyromegaly.     Vascular: No carotid bruit.  Cardiovascular:     Rate and Rhythm: Normal rate and regular rhythm.     Heart sounds: Normal heart sounds. No murmur heard.  No gallop.   Pulmonary:     Effort: Pulmonary effort is normal. No accessory muscle usage or respiratory distress.     Breath sounds: Normal breath sounds.  Abdominal:     General: Bowel sounds are normal.     Palpations: Abdomen is soft.  Musculoskeletal:     Cervical back: Normal range of motion and neck supple.     Right lower leg: 2+ Edema present.     Left lower leg: 2+ Edema present.  Skin:    General: Skin is warm and dry.  Neurological:     Mental Status: She is alert and oriented to person, place, and time.  Psychiatric:        Attention and Perception: Attention normal.        Mood and Affect: Mood normal.        Speech: Speech normal.        Behavior: Behavior normal. Behavior is cooperative.        Thought Content: Thought content normal.     Results for orders placed or performed in visit on 08/20/19  Microalbumin, Urine Waived  Result Value Ref Range   Microalb, Ur Waived 30 (H) 0 - 19 mg/L   Creatinine, Urine Waived 100 10 - 300 mg/dL   Microalb/Creat Ratio <30 <30 mg/g  VITAMIN D 25 Hydroxy (Vit-D Deficiency, Fractures)  Result Value Ref Range   Vit D, 25-Hydroxy 32.8 30.0 - 100.0 ng/mL  Vitamin B12  Result Value Ref Range   Vitamin B-12 1,108 232 - 1,245 pg/mL  Comprehensive metabolic panel  Result Value Ref Range   Glucose 78 65 - 99 mg/dL   BUN 12 8 - 27 mg/dL   Creatinine, Ser 0.78 0.57 - 1.00 mg/dL   GFR calc non Af Amer 80 >59 mL/min/1.73   GFR calc Af Amer 92 >59 mL/min/1.73   BUN/Creatinine Ratio 15 12 - 28   Sodium 142 134 - 144 mmol/L   Potassium 4.2 3.5 - 5.2 mmol/L   Chloride 104 96 - 106 mmol/L   CO2 23 20 - 29 mmol/L   Calcium 9.8 8.7 - 10.3 mg/dL   Total Protein 7.4 6.0 - 8.5 g/dL   Albumin 4.5 3.8 - 4.8 g/dL   Globulin, Total 2.9 1.5 - 4.5 g/dL    Albumin/Globulin Ratio 1.6 1.2 - 2.2   Bilirubin Total 0.3 0.0 - 1.2  mg/dL   Alkaline Phosphatase 92 39 - 117 IU/L   AST 16 0 - 40 IU/L   ALT 9 0 - 32 IU/L  Lipid Panel Piccolo, Waived  Result Value Ref Range   Cholesterol Piccolo, Waived 121 <200 mg/dL   HDL Chol Piccolo, Waived 50 (L) >59 mg/dL   Triglycerides Piccolo,Waived 105 <150 mg/dL   Chol/HDL Ratio Piccolo,Waive 2.4 mg/dL   LDL Chol Calc Piccolo Waived 50 <100 mg/dL   VLDL Chol Calc Piccolo,Waive 21 <30 mg/dL  Bayer DCA Hb A1c Waived  Result Value Ref Range   HB A1C (BAYER DCA - WAIVED) 6.5 <7.0 %      Assessment & Plan:   Problem List Items Addressed This Visit      Cardiovascular and Mediastinum   Hypertension associated with diabetes (HCC)    Chronic, ongoing.  BP at goal in office today.  Continue current medication regimen, with exception of HCTZ will change this to Lasix 20 MG daily due to edema.  Recommend checking BP at home three mornings a week and documenting for provider + focus on DASH diet.  BMP and TSH today.  Return in 3 months.      Relevant Medications   furosemide (LASIX) 20 MG tablet   Other Relevant Orders   Basic metabolic panel   Bayer DCA Hb A1c Waived   TSH     Endocrine   Type 2 diabetes mellitus with diabetic neuropathy, unspecified ([place]) - Primary    Chronic, ongoing with recent A1C levels below goal, today 6.6%.  Continue current medication regimen and adjust as needed. Continue daily B12 supplement for low levels in past.  Consider Gabapentin if worsening pain presents.      Relevant Orders   Bayer DCA Hb A1c Waived   Hyperlipidemia associated with type 2 diabetes mellitus (HCC)    Chronic, ongoing.  Continue current medication regimen and adjust as needed.  Lipid panel today.  Return in 3 months.      Relevant Orders   Lipid Panel w/o Chol/HDL Ratio   Bayer DCA Hb A1c Waived     Other   Morbid obesity ([place])    BMI 48.10 with T2DM, HTN.  Recommended eating smaller high  protein, low fat meals more frequently and exercising 30 mins a day 5 times a week with a goal of 10-15lb weight loss in the next 3 months. Patient voiced their understanding and motivation to adhere to these recommendations.       Shortness of breath    Over past two months with edema present.  No red flag symptoms.  Suspect related to 20 pound weight gain due to dietary indiscretions over past months. Recommend she focus on DASH diet, reducing sodium and calorie intake at home, + focus on regular activity at least 30 minutes 5 days a week.  Will trial change from HCTZ to Lasix.  Check BNP, BMP, TSH on labs today.  Continue Albuterol as needed.  Recommend compression hose daily at home.  If ongoing symptoms or abnormal BNP, consider echo.  Return in 6 weeks for follow-up, sooner if worsening.       Relevant Orders   B Nat Peptide    Other Visit Diagnoses    Flu vaccine need       Relevant Orders   Flu Vaccine QUAD High Dose(Fluad) (Completed)   Need for pneumococcal vaccine       Relevant Orders   Pneumococcal conjugate vaccine 13-valent IM (Completed)  Follow up plan: Return in about 6 weeks (around 02/08/2020) for SOB and EDEMA.

## 2019-12-28 NOTE — Assessment & Plan Note (Signed)
Chronic, ongoing.  Continue current medication regimen and adjust as needed.  Lipid panel today.  Return in 3 months. 

## 2019-12-28 NOTE — Assessment & Plan Note (Signed)
Chronic, ongoing.  BP at goal in office today.  Continue current medication regimen, with exception of HCTZ will change this to Lasix 20 MG daily due to edema.  Recommend checking BP at home three mornings a week and documenting for provider + focus on DASH diet.  BMP and TSH today.  Return in 3 months.

## 2019-12-29 ENCOUNTER — Ambulatory Visit
Admission: RE | Admit: 2019-12-29 | Discharge: 2019-12-29 | Disposition: A | Discharge status: Home / Self Care | Payer: Medicare HMO | Source: Ambulatory Visit | Attending: Nurse Practitioner | Admitting: Nurse Practitioner

## 2019-12-29 DIAGNOSIS — Z1231 Encounter for screening mammogram for malignant neoplasm of breast: Secondary | ICD-10-CM | POA: Diagnosis present

## 2019-12-29 LAB — BRAIN NATRIURETIC PEPTIDE: BNP: 35.3 pg/mL (ref 0.0–100.0)

## 2019-12-29 LAB — BASIC METABOLIC PANEL
BUN/Creatinine Ratio: 18 (ref 12–28)
BUN: 16 mg/dL (ref 8–27)
CO2: 22 mmol/L (ref 20–29)
Calcium: 9.6 mg/dL (ref 8.7–10.3)
Chloride: 104 mmol/L (ref 96–106)
Creatinine, Ser: 0.87 mg/dL (ref 0.57–1.00)
GFR calc Af Amer: 81 mL/min/{1.73_m2} (ref >59)
GFR calc non Af Amer: 70 mL/min/{1.73_m2} (ref >59)
Glucose: 128 mg/dL — ABNORMAL HIGH (ref 65–99)
Potassium: 4 mmol/L (ref 3.5–5.2)
Sodium: 141 mmol/L (ref 134–144)

## 2019-12-29 LAB — LIPID PANEL W/O CHOL/HDL RATIO
Cholesterol, Total: 129 mg/dL (ref 100–199)
HDL: 45 mg/dL (ref >39)
LDL Chol Calc (NIH): 67 mg/dL (ref 0–99)
Triglycerides: 89 mg/dL (ref 0–149)
VLDL Cholesterol Cal: 17 mg/dL (ref 5–40)

## 2019-12-29 LAB — TSH: TSH: 0.92 u[IU]/mL (ref 0.450–4.500)

## 2019-12-29 NOTE — Progress Notes (Signed)
Please let Kataryna know all her labs returned normal, including the heart failure blood work.  Start medication changes we discussed and if ongoing edema please return to office.  Also focus on diet changes.  Have a great day!!

## 2019-12-30 ENCOUNTER — Other Ambulatory Visit: Payer: Self-pay | Admitting: Nurse Practitioner

## 2019-12-30 DIAGNOSIS — R928 Other abnormal and inconclusive findings on diagnostic imaging of breast: Secondary | ICD-10-CM

## 2019-12-30 DIAGNOSIS — N6489 Other specified disorders of breast: Secondary | ICD-10-CM

## 2020-01-07 ENCOUNTER — Ambulatory Visit
Admission: RE | Admit: 2020-01-07 | Discharge: 2020-01-07 | Disposition: A | Discharge status: Home / Self Care | Payer: Medicare HMO | Source: Ambulatory Visit | Attending: Nurse Practitioner | Admitting: Nurse Practitioner

## 2020-01-07 DIAGNOSIS — N6489 Other specified disorders of breast: Secondary | ICD-10-CM | POA: Insufficient documentation

## 2020-01-07 DIAGNOSIS — R928 Other abnormal and inconclusive findings on diagnostic imaging of breast: Secondary | ICD-10-CM

## 2020-02-08 ENCOUNTER — Ambulatory Visit (INDEPENDENT_AMBULATORY_CARE_PROVIDER_SITE_OTHER): Payer: Medicare HMO | Admitting: Nurse Practitioner

## 2020-02-08 ENCOUNTER — Other Ambulatory Visit: Payer: Self-pay

## 2020-02-08 ENCOUNTER — Encounter: Payer: Self-pay | Admitting: Nurse Practitioner

## 2020-02-08 DIAGNOSIS — R0602 Shortness of breath: Secondary | ICD-10-CM | POA: Diagnosis not present

## 2020-02-08 DIAGNOSIS — R011 Cardiac murmur, unspecified: Secondary | ICD-10-CM | POA: Diagnosis not present

## 2020-02-08 DIAGNOSIS — R600 Localized edema: Secondary | ICD-10-CM

## 2020-02-08 NOTE — Patient Instructions (Signed)
Shortness of Breath, Adult Shortness of breath means you have trouble breathing. Shortness of breath could be a sign of a medical problem. Follow these instructions at home:   Watch for any changes in your symptoms.  Do not use any products that contain nicotine or tobacco, such as cigarettes, e-cigarettes, and chewing tobacco.  Do not smoke. Smoking can cause shortness of breath. If you need help to quit smoking, ask your doctor.  Avoid things that can make it harder to breathe, such as: ? Mold. ? Dust. ? Air pollution. ? Chemical smells. ? Things that can cause allergy symptoms (allergens), if you have allergies.  Keep your living space clean. Use products that help remove mold and dust.  Rest as needed. Slowly return to your normal activities.  Take over-the-counter and prescription medicines only as told by your doctor. This includes oxygen therapy and inhaled medicines.  Keep all follow-up visits as told by your doctor. This is important. Contact a doctor if:  Your condition does not get better as soon as expected.  You have a hard time doing your normal activities, even after you rest.  You have new symptoms. Get help right away if:  Your shortness of breath gets worse.  You have trouble breathing when you are resting.  You feel light-headed or you pass out (faint).  You have a cough that is not helped by medicines.  You cough up blood.  You have pain with breathing.  You have pain in your chest, arms, shoulders, or belly (abdomen).  You have a fever.  You cannot walk up stairs.  You cannot exercise the way you normally do. These symptoms may represent a serious problem that is an emergency. Do not wait to see if the symptoms will go away. Get medical help right away. Call your local emergency services (911 in the [place] Do not drive yourself to the hospital. Summary  Shortness of breath is when you have trouble breathing enough air. It can be a sign of a  medical problem.  Avoid things that make it hard for you to breathe, such as smoking, pollution, mold, and dust.  Watch for any changes in your symptoms. Contact your doctor if you do not get better or you get worse. This information is not intended to replace advice given to you by your health care provider. Make sure you discuss any questions you have with your health care provider. Document Revised: 09/08/2017 Document Reviewed: 09/08/2017 Elsevier Patient Education  2020 [employer]  

## 2020-02-08 NOTE — Assessment & Plan Note (Signed)
BMI 50.97 with T2DM, HTN.  Recommended eating smaller high protein, low fat meals more frequently and exercising 30 mins a day 5 times a week with a goal of 10-15lb weight loss in the next 3 months. Patient voiced their understanding and motivation to adhere to these recommendations.

## 2020-02-08 NOTE — Assessment & Plan Note (Signed)
Systolic Grade 2.  Order for echo to further evaluate.

## 2020-02-08 NOTE — Progress Notes (Signed)
BP 136/82   Pulse 66   Temp 98 F (36.7 C) (Oral)   Ht 5' (1.524 m)   Wt 261 lb (118.4 kg)   SpO2 97%   BMI 50.97 kg/m    Subjective:    Patient ID: Jillian Pierce, female    DOB: April 11, 1956, 64 y.o.   MRN: [id number]  HPI: Jillian Pierce is a 64 y.o. female  Chief Complaint  Patient presents with  . Follow-up    shortness of breath and edema, on 12/28/19 patient was started on lasix.    HYPERTENSION / HYPERLIPIDEMIA Continues on Lipitor and Norvasc, Lisinopril, Clonidine, and HCTZ.  Follow-up today for SOB and edema, was started on Lasix last visit.  Last visit reported some increased edema and SOB since weight gain over past months.  SOB she notices with longer distances, like going to [place], and when going up stairs.  None noticed with shorter distances.  At night she sleeps on two pillows, this is baseline for her no increase.  Does endorse a little bit of wheezing with walking -- uses Albuterol which offers benefit.  Albuterol also helps when SOB present.  Has been trying to focus on diet and not eating as many salty foods, trying to decrease high salt intake.  Swelling improves with elevation and started compression socks recently -- improved swelling.    Labs at recent visit 35.3 BNP.  She reports overall improvement this visit in swelling and shortness of breath.  Has CPAP at home, but is waiting on new tube for it. Satisfied with current treatment? yes Duration of hypertension: chronic BP monitoring frequency: not checking BP range:  BP medication side effects: no Duration of hyperlipidemia: chronic Cholesterol medication side effects: no Cholesterol supplements: none Medication compliance: good compliance Aspirin: yes Recent stressors: no Recurrent headaches: no Visual changes: no Palpitations: no Dyspnea: minimal  Chest pain: no Lower extremity edema: over past months, but improving with compression hose Dizzy/lightheaded: no  Relevant past medical,  surgical, family and social history reviewed and updated as indicated. Interim medical history since our last visit reviewed. Allergies and medications reviewed and updated.  Review of Systems  Constitutional: Negative for activity change, appetite change, diaphoresis, fatigue and fever.  Respiratory: Positive for shortness of breath. Negative for cough, chest tightness and wheezing.   Cardiovascular: Positive for leg swelling. Negative for chest pain and palpitations.  Gastrointestinal: Negative.   Endocrine: Negative for cold intolerance, heat intolerance, polydipsia, polyphagia and polyuria.  Neurological: Negative.   Psychiatric/Behavioral: Negative.     Per HPI unless specifically indicated above     Objective:    BP 136/82   Pulse 66   Temp 98 F (36.7 C) (Oral)   Ht 5' (1.524 m)   Wt 261 lb (118.4 kg)   SpO2 97%   BMI 50.97 kg/m   Wt Readings from Last 3 Encounters:  02/08/20 261 lb (118.4 kg)  12/28/19 263 lb (119.3 kg)  08/20/19 248 lb 6.4 oz (112.7 kg)    Physical Exam Vitals and nursing note reviewed.  Constitutional:      General: She is awake. She is not in acute distress.    Appearance: She is well-developed and well-groomed. She is morbidly obese. She is not ill-appearing.  HENT:     Head: Normocephalic.     Right Ear: Hearing normal.     Left Ear: Hearing normal.  Eyes:     General: Lids are normal.  Right eye: No discharge.        Left eye: No discharge.     Conjunctiva/sclera: Conjunctivae normal.     Pupils: Pupils are equal, round, and reactive to light.  Neck:     Thyroid: No thyromegaly.     Vascular: No carotid bruit.  Cardiovascular:     Rate and Rhythm: Normal rate and regular rhythm.     Heart sounds: Murmur heard.  Systolic murmur is present with a grade of 2/6.  No gallop.      Comments: Edema improved some this visit. Pulmonary:     Effort: Pulmonary effort is normal. No accessory muscle usage or respiratory distress.      Breath sounds: Normal breath sounds.  Abdominal:     General: Bowel sounds are normal.     Palpations: Abdomen is soft.  Musculoskeletal:     Cervical back: Normal range of motion and neck supple.     Right lower leg: 1+ Pitting Edema present.     Left lower leg: 1+ Pitting Edema present.  Skin:    General: Skin is warm and dry.  Neurological:     Mental Status: She is alert and oriented to person, place, and time.  Psychiatric:        Attention and Perception: Attention normal.        Mood and Affect: Mood normal.        Speech: Speech normal.        Behavior: Behavior normal. Behavior is cooperative.        Thought Content: Thought content normal.     Results for orders placed or performed in visit on 78/46/96  Basic metabolic panel  Result Value Ref Range   Glucose 128 (H) 65 - 99 mg/dL   BUN 16 8 - 27 mg/dL   Creatinine, Ser 0.87 0.57 - 1.00 mg/dL   GFR calc non Af Amer 70 >59 mL/min/1.73   GFR calc Af Amer 81 >59 mL/min/1.73   BUN/Creatinine Ratio 18 12 - 28   Sodium 141 134 - 144 mmol/L   Potassium 4.0 3.5 - 5.2 mmol/L   Chloride 104 96 - 106 mmol/L   CO2 22 20 - 29 mmol/L   Calcium 9.6 8.7 - 10.3 mg/dL  Lipid Panel w/o Chol/HDL Ratio  Result Value Ref Range   Cholesterol, Total 129 100 - 199 mg/dL   Triglycerides 89 0 - 149 mg/dL   HDL 45 >39 mg/dL   VLDL Cholesterol Cal 17 5 - 40 mg/dL   LDL Chol Calc (NIH) 67 0 - 99 mg/dL  Bayer DCA Hb A1c Waived  Result Value Ref Range   HB A1C (BAYER DCA - WAIVED) 6.6 <7.0 %  TSH  Result Value Ref Range   TSH 0.920 0.450 - 4.500 uIU/mL  Brain natriuretic peptide  Result Value Ref Range   BNP 35.3 0.0 - 100.0 pg/mL      Assessment & Plan:   Problem List Items Addressed This Visit      Other   Morbid obesity ([place]) - Primary    BMI 50.97 with T2DM, HTN.  Recommended eating smaller high protein, low fat meals more frequently and exercising 30 mins a day 5 times a week with a goal of 10-15lb weight loss in the next 3  months. Patient voiced their understanding and motivation to adhere to these recommendations.       Shortness of breath    Ongoing, but some improvement.  No red flag symptoms.  Suspect related to 20 pound weight gain due to dietary indiscretions over past months, decompensation. Recommend she focus on DASH diet, reducing sodium and calorie intake at home, + focus on regular activity at least 30 minutes 5 days a week. Continue Lasix daily.  Check BNP, BMP, CBC on labs today.  Continue Albuterol as needed.  Continue compression hose daily at home. Will obtain echo to further evaluate.  Return in 2 months for follow-up, sooner if worsening.      Relevant Orders   Basic metabolic panel   B Nat Peptide   CBC with Differential/Platelet   ECHOCARDIOGRAM COMPLETE   Heart murmur    Systolic Grade 2.  Order for echo to further evaluate.        Edema of both legs    Ongoing and improving with compression hose and Lasix.  No red flag symptoms.  Suspect related to 20 pound weight gain due to dietary indiscretions over past months. Recommend she focus on DASH diet, reducing sodium and calorie intake at home, + focus on regular activity at least 30 minutes 5 days a week.  Continue Albuterol as needed.  Recommend compression hose daily at home. Order for echo placed.  Return in 2 months, sooner if worsening.  She is to monitor weight daily at home and notify provider if gain of 3 pounds in 24 hours or 5 pounds in one week.          Follow up plan: Return in about 2 months (around 04/09/2020) for T2DM, HTN/HLD -- please get spirometry for SOB.

## 2020-02-08 NOTE — Assessment & Plan Note (Signed)
Ongoing and improving with compression hose and Lasix.  No red flag symptoms.  Suspect related to 20 pound weight gain due to dietary indiscretions over past months. Recommend she focus on DASH diet, reducing sodium and calorie intake at home, + focus on regular activity at least 30 minutes 5 days a week.  Continue Albuterol as needed.  Recommend compression hose daily at home. Order for echo placed.  Return in 2 months, sooner if worsening.  She is to monitor weight daily at home and notify provider if gain of 3 pounds in 24 hours or 5 pounds in one week.

## 2020-02-08 NOTE — Assessment & Plan Note (Signed)
Ongoing, but some improvement.  No red flag symptoms.  Suspect related to 20 pound weight gain due to dietary indiscretions over past months, decompensation. Recommend she focus on DASH diet, reducing sodium and calorie intake at home, + focus on regular activity at least 30 minutes 5 days a week. Continue Lasix daily.  Check BNP, BMP, CBC on labs today.  Continue Albuterol as needed.  Continue compression hose daily at home. Will obtain echo to further evaluate.  Return in 2 months for follow-up, sooner if worsening.

## 2020-02-09 LAB — CBC WITH DIFFERENTIAL/PLATELET
Basophils Absolute: 0 10*3/uL (ref 0.0–0.2)
Basos: 1 %
EOS (ABSOLUTE): 0.3 10*3/uL (ref 0.0–0.4)
Eos: 4 %
Hematocrit: 35 % (ref 34.0–46.6)
Hemoglobin: 11.5 g/dL (ref 11.1–15.9)
Immature Grans (Abs): 0 10*3/uL (ref 0.0–0.1)
Immature Granulocytes: 1 %
Lymphocytes Absolute: 1.6 10*3/uL (ref 0.7–3.1)
Lymphs: 26 %
MCH: 26.9 pg (ref 26.6–33.0)
MCHC: 32.9 g/dL (ref 31.5–35.7)
MCV: 82 fL (ref 79–97)
Monocytes Absolute: 0.6 10*3/uL (ref 0.1–0.9)
Monocytes: 10 %
Neutrophils Absolute: 3.8 10*3/uL (ref 1.4–7.0)
Neutrophils: 58 %
Platelets: 491 10*3/uL — ABNORMAL HIGH (ref 150–450)
RBC: 4.27 x10E6/uL (ref 3.77–5.28)
RDW: 14.1 % (ref 11.7–15.4)
WBC: 6.3 10*3/uL (ref 3.4–10.8)

## 2020-02-09 LAB — BASIC METABOLIC PANEL
BUN/Creatinine Ratio: 17 (ref 12–28)
BUN: 14 mg/dL (ref 8–27)
CO2: 21 mmol/L (ref 20–29)
Calcium: 9.7 mg/dL (ref 8.7–10.3)
Chloride: 104 mmol/L (ref 96–106)
Creatinine, Ser: 0.82 mg/dL (ref 0.57–1.00)
GFR calc Af Amer: 87 mL/min/{1.73_m2} (ref >59)
GFR calc non Af Amer: 75 mL/min/{1.73_m2} (ref >59)
Glucose: 94 mg/dL (ref 65–99)
Potassium: 4 mmol/L (ref 3.5–5.2)
Sodium: 142 mmol/L (ref 134–144)

## 2020-02-09 LAB — BRAIN NATRIURETIC PEPTIDE: BNP: 33.5 pg/mL (ref 0.0–100.0)

## 2020-02-09 NOTE — Progress Notes (Signed)
Please let Sharnae know that her labs have returned and overall remain stable, platelets were a little elevated, continue daily Baby Aspirin.  Let me know if you do not hear to schedule your echocardiogram over the next week.  If any questions let me know.  Have a great day!! Keep being awesome!!  Thank you for allowing me to participate in your care. Kindest regards, Marelyn Rouser

## 2020-02-21 ENCOUNTER — Telehealth: Payer: Self-pay | Admitting: Nurse Practitioner

## 2020-02-21 NOTE — Telephone Encounter (Signed)
Can we look into this, I had ordered echocardiogram for her and she has not heard to schedule.  Thanks.

## 2020-02-21 NOTE — Telephone Encounter (Signed)
Patient is calling because she was told two weeks ago that someone from the hospital was going to set her an appt up for her heart. Patient has not heard anything. Please advise [phone]- [phone]

## 2020-02-23 NOTE — Telephone Encounter (Signed)
Spoke with referral coordinator, appointment scheduled for 02/28/20 at 10:45 AM.   Called and notified patient of appt date/time and location.

## 2020-02-23 NOTE — Telephone Encounter (Signed)
Message sent to referral coordinator to check status of this order.

## 2020-02-28 ENCOUNTER — Other Ambulatory Visit: Payer: Self-pay

## 2020-02-28 ENCOUNTER — Ambulatory Visit
Admission: RE | Admit: 2020-02-28 | Discharge: 2020-02-28 | Disposition: A | Discharge status: Home / Self Care | Payer: Medicare HMO | Source: Ambulatory Visit | Attending: Nurse Practitioner | Admitting: Nurse Practitioner

## 2020-02-28 DIAGNOSIS — I10 Essential (primary) hypertension: Secondary | ICD-10-CM | POA: Insufficient documentation

## 2020-02-28 DIAGNOSIS — E119 Type 2 diabetes mellitus without complications: Secondary | ICD-10-CM | POA: Insufficient documentation

## 2020-02-28 DIAGNOSIS — R0602 Shortness of breath: Secondary | ICD-10-CM

## 2020-02-28 LAB — ECHOCARDIOGRAM COMPLETE
AR max vel: 1.87 cm2
AV Area VTI: 1.91 cm2
AV Area mean vel: 1.95 cm2
AV Mean grad: 8.3 mmHg
AV Peak grad: 15.7 mmHg
Ao pk vel: 1.98 m/s
Area-P 1/2: 2.92 cm2
S' Lateral: 2.8 cm

## 2020-02-28 NOTE — Progress Notes (Signed)
Please let [name] know her echocardiogram has returned and shows no heart failure.  There is some enlargement of the left ventricular, a finding we see often with patients with high blood pressure and diabetes, but for this we continue current medications and good control.  Overall echo looks good with no concerns, if ongoing edema or shortness of breath return to office.  Have a great day!! Keep being awesome!!  Thank you for allowing me to participate in your care. Kindest regards, Hamdan Toscano

## 2020-02-28 NOTE — Progress Notes (Signed)
*  PRELIMINARY RESULTS* Echocardiogram 2D Echocardiogram has been performed.  Sherrie Sport 02/28/2020, 11:43 AM

## 2020-03-20 IMAGING — MG DIGITAL SCREENING BILATERAL MAMMOGRAM WITH TOMO AND CAD
8 series · 8 of 24 positions shown · non-contrast
Comparison: None.

CLINICAL DATA: Screening.

EXAM:
DIGITAL SCREENING BILATERAL MAMMOGRAM WITH TOMO AND CAD

[R MLO synth-2D]
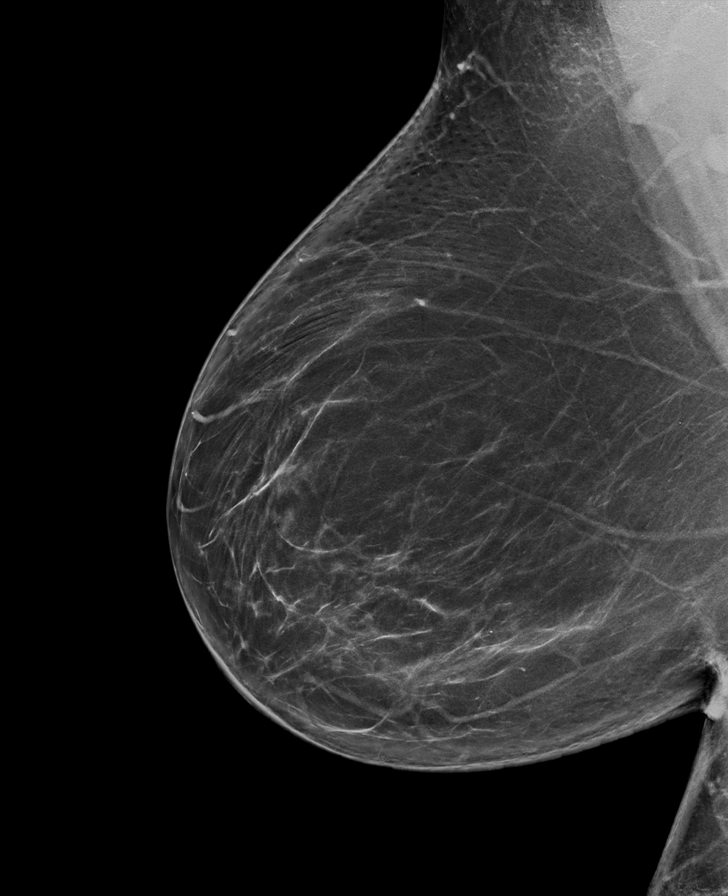

[L CC synth-2D]
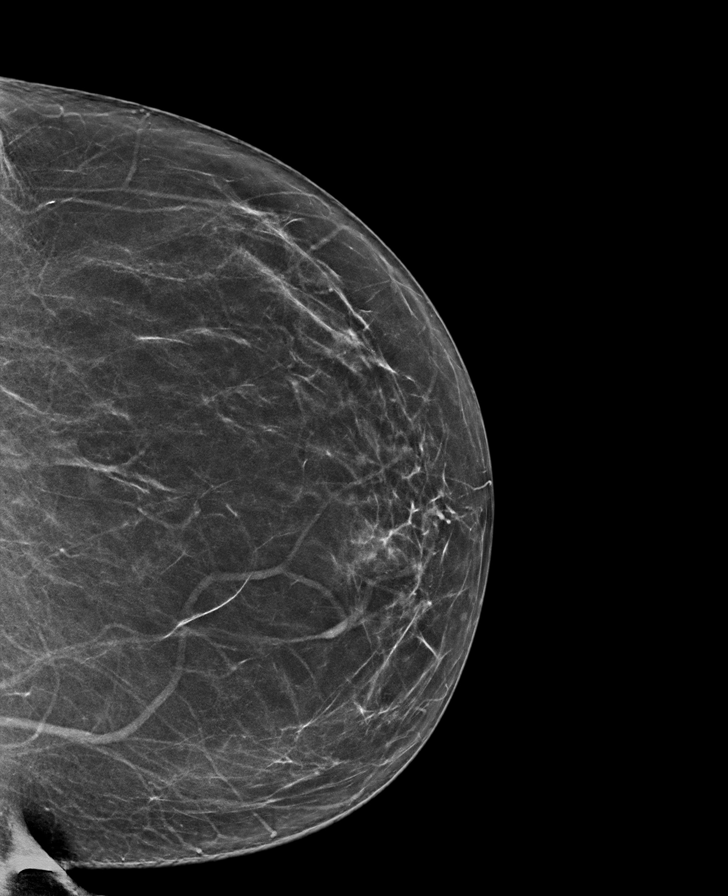

[L MLO synth-2D]
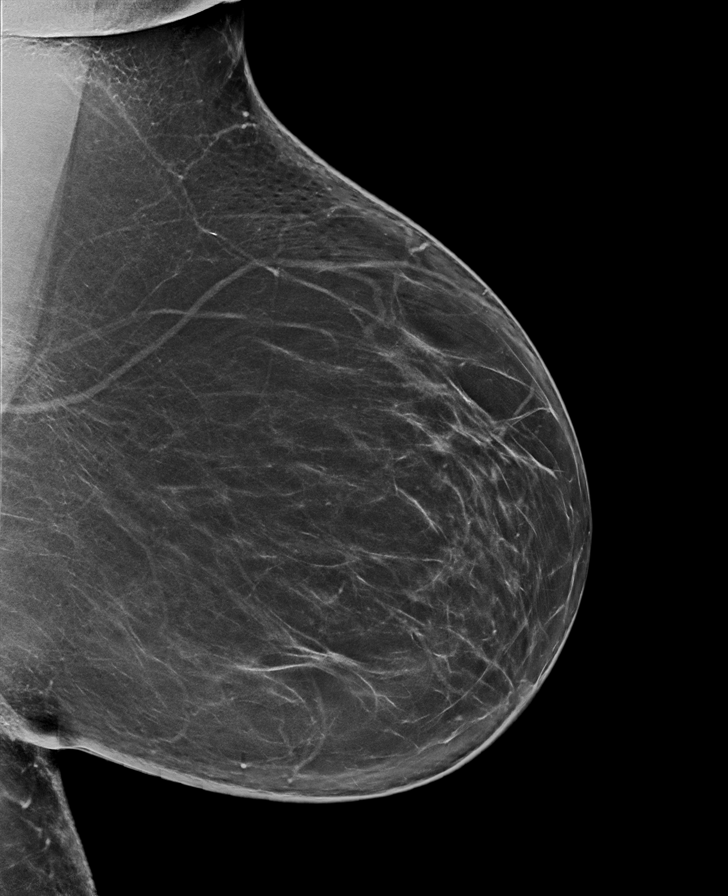

[R CC synth-2D]
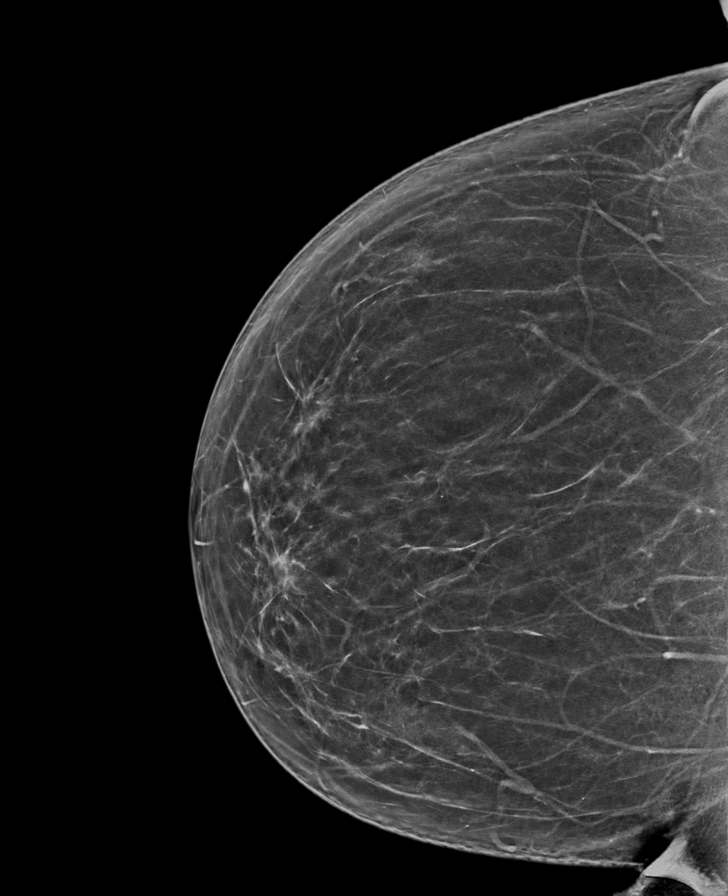

[L MLO tomo · tomo slice 45/90.0]
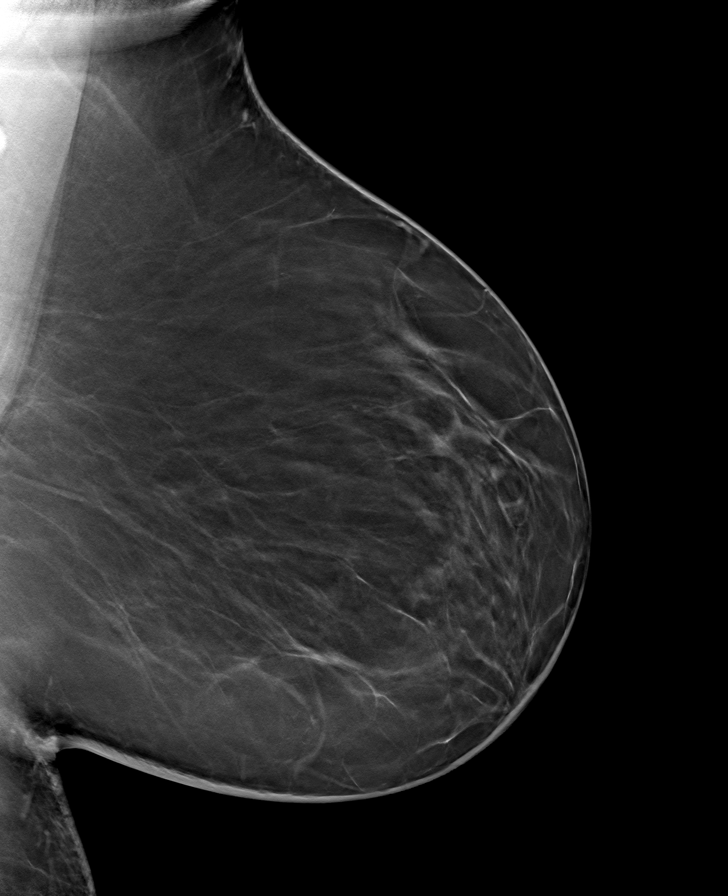

[R CC tomo · tomo slice 39/77.0]
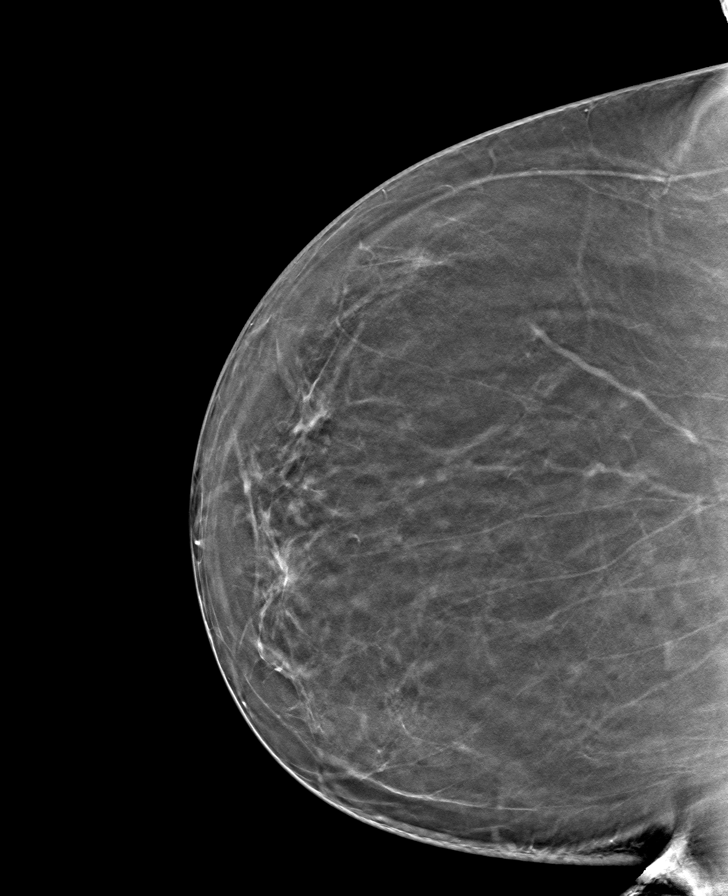

[R MLO tomo · tomo slice 47/92.0]
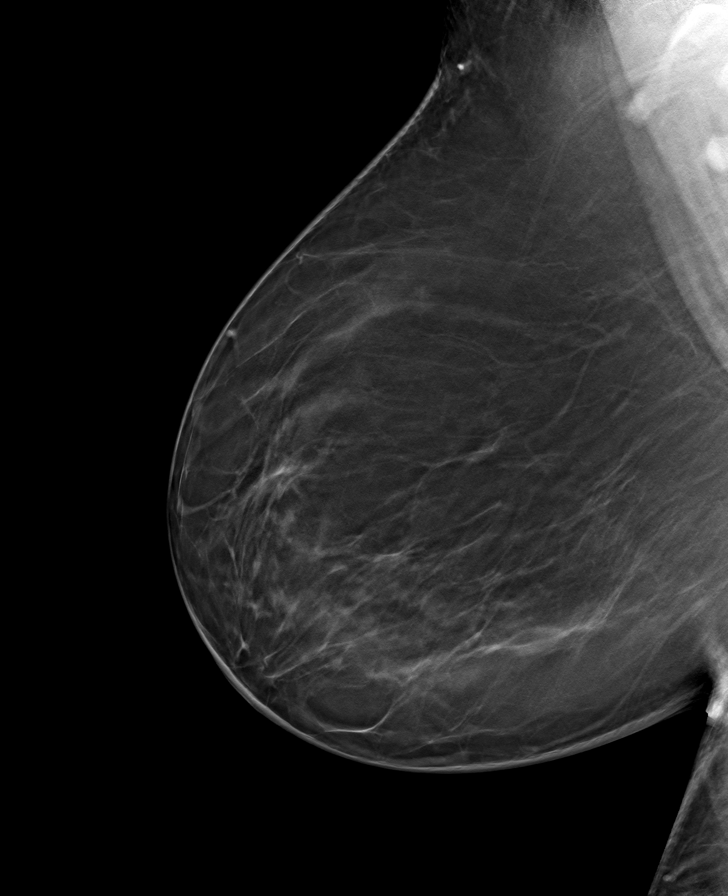

[L CC tomo · tomo slice 37/74.0]
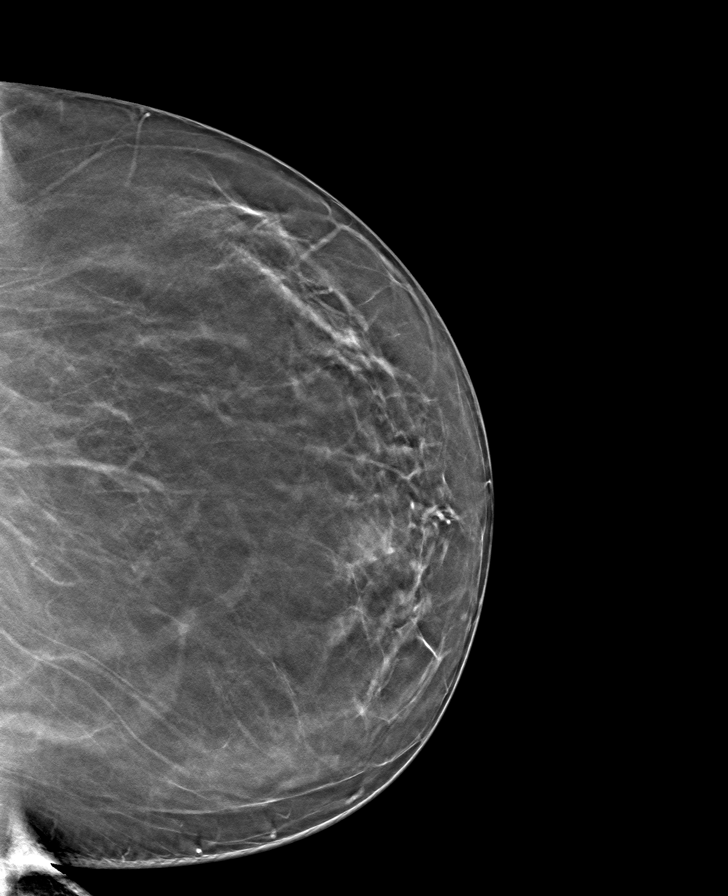

[8 of 24 positions shown; findings below may reference images not displayed]

ACR Breast Density Category b: There are scattered areas of
fibroglandular density.
FINDINGS: There are no findings suspicious for malignancy. Images were
processed with CAD.
IMPRESSION: No mammographic evidence of malignancy. A result letter of this
screening mammogram will be mailed directly to the patient.

RECOMMENDATION:
Screening mammogram in one year. (Code:[id number]-EJ6)

BI-RADS CATEGORY  1: Negative.

## 2020-03-23 ENCOUNTER — Other Ambulatory Visit: Payer: Self-pay

## 2020-03-23 DIAGNOSIS — I152 Hypertension secondary to endocrine disorders: Secondary | ICD-10-CM

## 2020-03-23 DIAGNOSIS — R809 Proteinuria, unspecified: Secondary | ICD-10-CM

## 2020-03-23 DIAGNOSIS — E1129 Type 2 diabetes mellitus with other diabetic kidney complication: Secondary | ICD-10-CM

## 2020-03-23 DIAGNOSIS — E1159 Type 2 diabetes mellitus with other circulatory complications: Secondary | ICD-10-CM

## 2020-03-23 DIAGNOSIS — E1140 Type 2 diabetes mellitus with diabetic neuropathy, unspecified: Secondary | ICD-10-CM

## 2020-03-23 MED ORDER — METFORMIN HCL 500 MG PO TABS: ORAL_TABLET | ORAL | 4 refills | Status: DC

## 2020-03-23 MED ORDER — FUROSEMIDE 20 MG PO TABS
20.0000 mg | ORAL_TABLET | Freq: Every day | ORAL | 4 refills | Status: DC
Start: 2020-03-23 — End: 2021-02-15

## 2020-03-23 MED ORDER — AMLODIPINE BESYLATE 10 MG PO TABS: 10.0000 mg | ORAL_TABLET | Freq: Every day | ORAL | 4 refills | Status: DC

## 2020-03-27 ENCOUNTER — Other Ambulatory Visit: Payer: Self-pay

## 2020-03-27 DIAGNOSIS — E785 Hyperlipidemia, unspecified: Secondary | ICD-10-CM

## 2020-03-27 DIAGNOSIS — E1169 Type 2 diabetes mellitus with other specified complication: Secondary | ICD-10-CM

## 2020-03-27 DIAGNOSIS — I152 Hypertension secondary to endocrine disorders: Secondary | ICD-10-CM

## 2020-03-27 MED ORDER — CLONIDINE HCL 0.3 MG PO TABS: 0.3000 mg | ORAL_TABLET | Freq: Two times a day (BID) | ORAL | 3 refills | Status: DC

## 2020-03-27 MED ORDER — LISINOPRIL 10 MG PO TABS: 10.0000 mg | ORAL_TABLET | Freq: Every day | ORAL | 3 refills | Status: DC

## 2020-03-27 MED ORDER — ALBUTEROL SULFATE HFA 108 (90 BASE) MCG/ACT IN AERS: 2.0000 | INHALATION_SPRAY | Freq: Four times a day (QID) | RESPIRATORY_TRACT | 1 refills | Status: DC | PRN

## 2020-03-27 MED ORDER — ATORVASTATIN CALCIUM 10 MG PO TABS: 10.0000 mg | ORAL_TABLET | Freq: Every day | ORAL | 3 refills | Status: DC

## 2020-03-27 NOTE — Telephone Encounter (Signed)
Patient would like these sent to [facility]

## 2020-04-10 ENCOUNTER — Other Ambulatory Visit: Payer: Self-pay

## 2020-04-10 ENCOUNTER — Encounter: Payer: Self-pay | Admitting: Nurse Practitioner

## 2020-04-10 ENCOUNTER — Ambulatory Visit (INDEPENDENT_AMBULATORY_CARE_PROVIDER_SITE_OTHER): Payer: Medicare HMO | Admitting: Nurse Practitioner

## 2020-04-10 VITALS — BP 123/71 | HR 60 | Temp 98.0°F | Ht 60.24 in | Wt 253.0 lb

## 2020-04-10 DIAGNOSIS — E1129 Type 2 diabetes mellitus with other diabetic kidney complication: Secondary | ICD-10-CM | POA: Diagnosis not present

## 2020-04-10 DIAGNOSIS — E1159 Type 2 diabetes mellitus with other circulatory complications: Secondary | ICD-10-CM

## 2020-04-10 DIAGNOSIS — E785 Hyperlipidemia, unspecified: Secondary | ICD-10-CM

## 2020-04-10 DIAGNOSIS — Z114 Encounter for screening for human immunodeficiency virus [HIV]: Secondary | ICD-10-CM

## 2020-04-10 DIAGNOSIS — E1140 Type 2 diabetes mellitus with diabetic neuropathy, unspecified: Secondary | ICD-10-CM

## 2020-04-10 DIAGNOSIS — E1169 Type 2 diabetes mellitus with other specified complication: Secondary | ICD-10-CM

## 2020-04-10 DIAGNOSIS — R011 Cardiac murmur, unspecified: Secondary | ICD-10-CM

## 2020-04-10 DIAGNOSIS — Z1159 Encounter for screening for other viral diseases: Secondary | ICD-10-CM

## 2020-04-10 DIAGNOSIS — R809 Proteinuria, unspecified: Secondary | ICD-10-CM

## 2020-04-10 DIAGNOSIS — I152 Hypertension secondary to endocrine disorders: Secondary | ICD-10-CM

## 2020-04-10 LAB — BAYER DCA HB A1C WAIVED: HB A1C (BAYER DCA - WAIVED): 6.4 % (ref <7.0)

## 2020-04-10 NOTE — Assessment & Plan Note (Signed)
Systolic Grade 2.  Recent echo with no major concerns noted and reviewed with patient.  Will refer to cardiology if symptoms present.

## 2020-04-10 NOTE — Assessment & Plan Note (Addendum)
BMI 49.02 with T2DM, HTN.  Recommended eating smaller high protein, low fat meals more frequently and exercising 30 mins a day 5 times a week with a goal of 10-15lb weight loss in the next 3 months. Patient voiced their understanding and motivation to adhere to these recommendations.

## 2020-04-10 NOTE — Assessment & Plan Note (Signed)
Chronic, ongoing with recent A1C below goal at 6.4%.  Continue current medication regimen, Lisinopril for kidney protection.  Recommend checking BS three mornings a week and documenting for provider.  A1C and urine micro next visit.  Will adjust regimen as needed based on results.  Return in 3 months.

## 2020-04-10 NOTE — Patient Instructions (Signed)
Take Pepcid as needed   Food Choices for Gastroesophageal Reflux Disease, Adult When you have gastroesophageal reflux disease (GERD), the foods you eat and your eating habits are very important. Choosing the right foods can help ease your discomfort. Think about working with a nutrition specialist (dietitian) to help you make good choices. What are tips for following this plan?  Meals  Choose healthy foods that are low in fat, such as fruits, vegetables, whole grains, low-fat dairy products, and lean meat, fish, and poultry.  Eat small meals often instead of 3 large meals a day. Eat your meals slowly, and in a place where you are relaxed. Avoid bending over or lying down until 2-3 hours after eating.  Avoid eating meals 2-3 hours before bed.  Avoid drinking a lot of liquid with meals.  Cook foods using methods other than frying. Bake, grill, or broil food instead.  Avoid or limit: ? Chocolate. ? Peppermint or spearmint. ? Alcohol. ? Pepper. ? Black and decaffeinated coffee. ? Black and decaffeinated tea. ? Bubbly (carbonated) soft drinks. ? Caffeinated energy drinks and soft drinks.  Limit high-fat foods such as: ? Fatty meat or fried foods. ? Whole milk, cream, butter, or ice cream. ? Nuts and nut butters. ? Pastries, donuts, and sweets made with butter or shortening.  Avoid foods that cause symptoms. These foods may be different for everyone. Common foods that cause symptoms include: ? Tomatoes. ? Oranges, lemons, and limes. ? Peppers. ? Spicy food. ? Onions and garlic. ? Vinegar. Lifestyle  Maintain a healthy weight. Ask your doctor what weight is healthy for you. If you need to lose weight, work with your doctor to do so safely.  Exercise for at least 30 minutes for 5 or more days each week, or as told by your doctor.  Wear loose-fitting clothes.  Do not smoke. If you need help quitting, ask your doctor.  Sleep with the head of your bed higher than your feet.  Use a wedge under the mattress or blocks under the bed frame to raise the head of the bed. Summary  When you have gastroesophageal reflux disease (GERD), food and lifestyle choices are very important in easing your symptoms.  Eat small meals often instead of 3 large meals a day. Eat your meals slowly, and in a place where you are relaxed.  Limit high-fat foods such as fatty meat or fried foods.  Avoid bending over or lying down until 2-3 hours after eating.  Avoid peppermint and spearmint, caffeine, alcohol, and chocolate. This information is not intended to replace advice given to you by your health care provider. Make sure you discuss any questions you have with your health care provider. Document Revised: 07/30/2018 Document Reviewed: 05/14/2016 Elsevier Patient Education  [place].

## 2020-04-10 NOTE — Assessment & Plan Note (Signed)
Chronic, ongoing.  BP at goal in office today.  Continue current medication regimen and adjust as needed.  Recommend checking BP at home three mornings a week and documenting for provider + focus on DASH diet.  BMP today.  Return in 3 months.

## 2020-04-10 NOTE — Progress Notes (Signed)
BP 123/71   Pulse 60   Temp 98 F (36.7 C) (Oral)   Ht 5' 0.24" (1.53 m)   Wt 253 lb (114.8 kg)   SpO2 98%   BMI 49.02 kg/m    Subjective:    Patient ID: Jillian Pierce, female    DOB: Feb 26, 1956, 64 y.o.   MRN: [id number]  HPI: Jillian Pierce is a 64 y.o. female  Chief Complaint  Patient presents with  . Diabetes  . Morbid Obesity   DIABETES Last A1C 6.6% in September. Continues on Metformin 250 MG BID. Hypoglycemic episodes:no Polydipsia/polyuria: no Visual disturbance: no Chest pain: no Paresthesias: no Glucose Monitoring: no             Accucheck frequency: Not Checking             Fasting glucose:             Post prandial:             Evening:             Before meals: Taking Insulin?: no             Long acting insulin:             Short acting insulin: Blood Pressure Monitoring: not checking Retinal Examination: Up to Date Foot Exam: Up to Date Pneumovax: Up to Date Influenza: Up to Date Aspirin: yes   HYPERTENSION / HYPERLIPIDEMIA Continues on Lipitor and Norvasc, Lisinopril, Clonidine, and HCTZ. Satisfied with current treatment? yes Duration of hypertension: chronic BP monitoring frequency: not checking BP range:  BP medication side effects: no Duration of hyperlipidemia: chronic Cholesterol medication side effects: no Cholesterol supplements: none Medication compliance: good compliance Aspirin: yes Recent stressors: no Recurrent headaches: no Visual changes: no Palpitations: no Dyspnea: over past months with weight gain -- notices it with long distance only Chest pain: no Lower extremity edema: over past months with weight gain Dizzy/lightheaded: no  GERD Recent issues if eats spicy foods and tomatoes. GERD control status: uncontrolled  Satisfied with current treatment? yes Heartburn frequency:  With spicy food only Medication side effects: no  Medication compliance: stable Previous GERD medications: none Antacid use frequency:  A  couple times a week Alleviatiating factors:  TUMS Aggravating factors:  Spicy foods Dysphagia: no Odynophagia:  no Hematemesis: no Blood in stool: no EGD: no   Relevant past medical, surgical, family and social history reviewed and updated as indicated. Interim medical history since our last visit reviewed. Allergies and medications reviewed and updated.  Review of Systems  Constitutional: Negative for activity change, appetite change, diaphoresis, fatigue and fever.  Respiratory: Negative for cough, chest tightness, shortness of breath and wheezing.   Cardiovascular: Negative for chest pain, palpitations and leg swelling.  Gastrointestinal: Negative.   Endocrine: Negative for cold intolerance, heat intolerance, polydipsia, polyphagia and polyuria.  Neurological: Negative.   Psychiatric/Behavioral: Negative.     Per HPI unless specifically indicated above     Objective:    BP 123/71   Pulse 60   Temp 98 F (36.7 C) (Oral)   Ht 5' 0.24" (1.53 m)   Wt 253 lb (114.8 kg)   SpO2 98%   BMI 49.02 kg/m   Wt Readings from Last 3 Encounters:  04/10/20 253 lb (114.8 kg)  02/08/20 261 lb (118.4 kg)  12/28/19 263 lb (119.3 kg)    Physical Exam Vitals and nursing note reviewed.  Constitutional:      General: She is  awake. She is not in acute distress.    Appearance: She is well-developed and well-groomed. She is morbidly obese. She is not ill-appearing.  HENT:     Head: Normocephalic.     Right Ear: Hearing normal.     Left Ear: Hearing normal.  Eyes:     General: Lids are normal.        Right eye: No discharge.        Left eye: No discharge.     Conjunctiva/sclera: Conjunctivae normal.     Pupils: Pupils are equal, round, and reactive to light.  Neck:     Thyroid: No thyromegaly.     Vascular: No carotid bruit.  Cardiovascular:     Rate and Rhythm: Normal rate and regular rhythm.     Heart sounds: Murmur heard.   Systolic murmur is present with a grade of 2/6. No  gallop.   Pulmonary:     Effort: Pulmonary effort is normal. No accessory muscle usage or respiratory distress.     Breath sounds: Normal breath sounds.  Abdominal:     General: Bowel sounds are normal.     Palpations: Abdomen is soft.  Musculoskeletal:     Cervical back: Normal range of motion and neck supple.     Right lower leg: 1+ Edema present.     Left lower leg: 1+ Edema present.  Skin:    General: Skin is warm and dry.  Neurological:     Mental Status: She is alert and oriented to person, place, and time.  Psychiatric:        Attention and Perception: Attention normal.        Mood and Affect: Mood normal.        Speech: Speech normal.        Behavior: Behavior normal. Behavior is cooperative.        Thought Content: Thought content normal.     Diabetic Foot Exam - Simple   Simple Foot Form Visual Inspection No deformities, no ulcerations, no other skin breakdown bilaterally: Yes Sensation Testing Intact to touch and monofilament testing bilaterally: Yes Pulse Check Posterior Tibialis and Dorsalis pulse intact bilaterally: Yes Comments     Results for orders placed or performed during the hospital encounter of 02/28/20  ECHOCARDIOGRAM COMPLETE  Result Value Ref Range   Ao pk vel 1.98 m/s   AV Area VTI 1.91 cm2   AR max vel 1.87 cm2   AV Mean grad 8.3 mmHg   AV Peak grad 15.7 mmHg   S' Lateral 2.80 cm   AV Area mean vel 1.95 cm2   Area-P 1/2 2.92 cm2      Assessment & Plan:   Problem List Items Addressed This Visit      Cardiovascular and Mediastinum   Hypertension associated with diabetes (HCC)    Chronic, ongoing.  BP at goal in office today.  Continue current medication regimen and adjust as needed.  Recommend checking BP at home three mornings a week and documenting for provider + focus on DASH diet.  BMP today.  Return in 3 months.      Relevant Orders   CBC with Differential/Platelet   Basic metabolic panel   Bayer DCA Hb A1c Waived      Endocrine   Type 2 diabetes mellitus with diabetic neuropathy, unspecified (HCC) - Primary    Chronic, ongoing with recent A1C levels below goal, today 6.4%.  Continue current medication regimen and adjust as needed. Continue daily B12 supplement for  low levels in past.  Consider Gabapentin if worsening pain presents.  Return in 3 months.      Relevant Orders   Bayer DCA Hb A1c Waived   Hyperlipidemia associated with type 2 diabetes mellitus (HCC)    BMI 49.02 with T2DM, HTN.  Recommended eating smaller high protein, low fat meals more frequently and exercising 30 mins a day 5 times a week with a goal of 10-15lb weight loss in the next 3 months. Patient voiced their understanding and motivation to adhere to these recommendations.       Relevant Orders   Bayer DCA Hb A1c Waived   Diabetes mellitus with proteinuria (HCC)    Chronic, ongoing with recent A1C below goal at 6.4%.  Continue current medication regimen, Lisinopril for kidney protection.  Recommend checking BS three mornings a week and documenting for provider.  A1C and urine micro next visit.  Will adjust regimen as needed based on results.  Return in 3 months.        Other   Morbid obesity (HCC)    BMI 49.02 with T2DM, HTN.  Recommended eating smaller high protein, low fat meals more frequently and exercising 30 mins a day 5 times a week with a goal of 10-15lb weight loss in the next 3 months. Patient voiced their understanding and motivation to adhere to these recommendations.       Heart murmur    Systolic Grade 2.  Recent echo with no major concerns noted and reviewed with patient.  Will refer to cardiology if symptoms present.       Other Visit Diagnoses    Need for hepatitis C screening test       Hep C on labs today   Relevant Orders   Hepatitis C Antibody   Encounter for screening for HIV       HIV screen on labs today   Relevant Orders   HIV antibody (with reflex)       Follow up plan: Return in about 3  months (around 07/09/2020) for T2DM, HTN/HLD.

## 2020-04-10 NOTE — Assessment & Plan Note (Signed)
BMI 49.02 with T2DM, HTN.  Recommended eating smaller high protein, low fat meals more frequently and exercising 30 mins a day 5 times a week with a goal of 10-15lb weight loss in the next 3 months. Patient voiced their understanding and motivation to adhere to these recommendations.

## 2020-04-10 NOTE — Assessment & Plan Note (Signed)
Chronic, ongoing with recent A1C levels below goal, today 6.4%.  Continue current medication regimen and adjust as needed. Continue daily B12 supplement for low levels in past.  Consider Gabapentin if worsening pain presents.  Return in 3 months.

## 2020-04-11 LAB — CBC WITH DIFFERENTIAL/PLATELET
Basophils Absolute: 0 10*3/uL (ref 0.0–0.2)
Basos: 1 %
EOS (ABSOLUTE): 0.3 10*3/uL (ref 0.0–0.4)
Eos: 5 %
Hematocrit: 34.8 % (ref 34.0–46.6)
Hemoglobin: 11.4 g/dL (ref 11.1–15.9)
Immature Grans (Abs): 0 10*3/uL (ref 0.0–0.1)
Immature Granulocytes: 0 %
Lymphocytes Absolute: 1.5 10*3/uL (ref 0.7–3.1)
Lymphs: 27 %
MCH: 26.5 pg — ABNORMAL LOW (ref 26.6–33.0)
MCHC: 32.8 g/dL (ref 31.5–35.7)
MCV: 81 fL (ref 79–97)
Monocytes Absolute: 0.5 10*3/uL (ref 0.1–0.9)
Monocytes: 9 %
Neutrophils Absolute: 3.3 10*3/uL (ref 1.4–7.0)
Neutrophils: 58 %
Platelets: 461 10*3/uL — ABNORMAL HIGH (ref 150–450)
RBC: 4.3 x10E6/uL (ref 3.77–5.28)
RDW: 13.3 % (ref 11.7–15.4)
WBC: 5.6 10*3/uL (ref 3.4–10.8)

## 2020-04-11 LAB — BASIC METABOLIC PANEL
BUN/Creatinine Ratio: 15 (ref 12–28)
BUN: 14 mg/dL (ref 8–27)
CO2: 22 mmol/L (ref 20–29)
Calcium: 9.5 mg/dL (ref 8.7–10.3)
Chloride: 103 mmol/L (ref 96–106)
Creatinine, Ser: 0.96 mg/dL (ref 0.57–1.00)
GFR calc Af Amer: 72 mL/min/{1.73_m2} (ref >59)
GFR calc non Af Amer: 62 mL/min/{1.73_m2} (ref >59)
Glucose: 115 mg/dL — ABNORMAL HIGH (ref 65–99)
Potassium: 4.1 mmol/L (ref 3.5–5.2)
Sodium: 140 mmol/L (ref 134–144)

## 2020-04-11 LAB — HEPATITIS C ANTIBODY: Hep C Virus Ab: <0.1 s/co ratio (ref 0.0–0.9)

## 2020-04-11 LAB — HIV ANTIBODY (ROUTINE TESTING W REFLEX): HIV Screen 4th Generation wRfx: NONREACTIVE

## 2020-04-11 NOTE — Progress Notes (Signed)
Please let Koralyn know that labs have returned and overall are reassuring with no major concerns noted, platelet level is trending downwards towards normal -- will recheck this next visit.  Keep on current medications and if any questions let me know.  Have a Merry Christmas!! Keep being awesome!!  Thank you for allowing me to participate in your care. Kindest regards, Keiosha Cancro

## 2020-05-29 ENCOUNTER — Other Ambulatory Visit: Payer: Self-pay | Admitting: Nurse Practitioner

## 2020-05-29 ENCOUNTER — Telehealth: Payer: Self-pay

## 2020-05-29 DIAGNOSIS — Z1231 Encounter for screening mammogram for malignant neoplasm of breast: Secondary | ICD-10-CM

## 2020-05-29 NOTE — Telephone Encounter (Signed)
Copied from [place] [id number]. Topic: Referral - Request for Referral >> May 29, 2020  4:19 PM Yvette Rack wrote: Has patient seen PCP for this complaint? yes  *If NO, is insurance requiring patient see PCP for this issue before PCP can refer them? Referral for which specialty: mammogram Preferred provider/office: [facility] Reason for referral: mammogram

## 2020-05-29 NOTE — Telephone Encounter (Signed)
Order is in, she may call to schedule.

## 2020-05-29 NOTE — Telephone Encounter (Signed)
Routing to provider  

## 2020-05-30 ENCOUNTER — Other Ambulatory Visit: Payer: Self-pay

## 2020-05-30 NOTE — Progress Notes (Signed)
No message present, can I assist with something.  I see orders but no ability to cosign these.

## 2020-05-30 NOTE — Telephone Encounter (Signed)
Patient notified

## 2020-05-31 ENCOUNTER — Other Ambulatory Visit: Payer: Self-pay

## 2020-05-31 ENCOUNTER — Other Ambulatory Visit: Payer: Self-pay | Admitting: Nurse Practitioner

## 2020-05-31 DIAGNOSIS — N6320 Unspecified lump in the left breast, unspecified quadrant: Secondary | ICD-10-CM

## 2020-05-31 NOTE — Progress Notes (Signed)
Good morning, you will need to send to me as a cosign, I am unable to cosign via pending orders.  Thanks:)

## 2020-06-01 ENCOUNTER — Other Ambulatory Visit: Payer: Self-pay

## 2020-06-26 ENCOUNTER — Ambulatory Visit (INDEPENDENT_AMBULATORY_CARE_PROVIDER_SITE_OTHER): Payer: Medicare HMO

## 2020-06-26 VITALS — Ht 62.0 in | Wt 245.0 lb

## 2020-06-26 DIAGNOSIS — Z0000 Encounter for general adult medical examination without abnormal findings: Secondary | ICD-10-CM

## 2020-06-26 NOTE — Progress Notes (Signed)
I connected with Prabhleen Montemayor today by telephone and verified that I am speaking with the correct person using two identifiers. Location patient: home Location provider: work Persons participating in the virtual visit: Demitra, Danley LPN.   I discussed the limitations, risks, security and privacy concerns of performing an evaluation and management service by telephone and the availability of in person appointments. I also discussed with the patient that there may be a patient responsible charge related to this service. The patient expressed understanding and verbally consented to this telephonic visit.    Interactive audio and video telecommunications were attempted between this provider and patient, however failed, due to patient having technical difficulties OR patient did not have access to video capability.  We continued and completed visit with audio only.     Vital signs may be patient reported or missing.  Subjective:   Jillian Pierce is a 65 y.o. female who presents for Medicare Annual (Subsequent) preventive examination.  Review of Systems     Cardiac Risk Factors include: advanced age (>20men, >30 women);diabetes mellitus;dyslipidemia;hypertension;obesity (BMI >30kg/m2);sedentary lifestyle     Objective:    Today's Vitals   06/26/20 1111  Weight: 245 lb (111.1 kg)  Height: 5\' 2"  (1.575 m)   Body mass index is 44.81 kg/m.  Advanced Directives 06/26/2020 06/17/2019 11/03/2018 07/15/2018 11/21/2014  Does Patient Have a Medical Advance Directive? No No No No No  Would patient like information on creating a medical advance directive? - - No - Patient declined - Yes - Educational materials given    Current Medications (verified) Outpatient Encounter Medications as of 06/26/2020  Medication Sig  . albuterol (VENTOLIN HFA) 108 (90 Base) MCG/ACT inhaler Inhale 2 puffs into the lungs every 6 (six) hours as needed for wheezing or shortness of breath.  Marland Kitchen amLODipine (NORVASC)  10 MG tablet Take 1 tablet (10 mg total) by mouth daily.  . Ascorbic Acid (VITAMIN C PO) Take by mouth daily.  Marland Kitchen aspirin EC 81 MG tablet Take 81 mg by mouth daily.  Marland Kitchen atorvastatin (LIPITOR) 10 MG tablet Take 1 tablet (10 mg total) by mouth daily.  . cloNIDine (CATAPRES) 0.3 MG tablet Take 1 tablet (0.3 mg total) by mouth 2 (two) times daily.  . Cyanocobalamin (VITAMIN B 12 PO) Take by mouth daily.  . cyclobenzaprine (FLEXERIL) 5 MG tablet Take 1 tablet (5 mg total) by mouth 2 (two) times daily.  . fluticasone (FLONASE) 50 MCG/ACT nasal spray Place 2 sprays into both nostrils daily.  . furosemide (LASIX) 20 MG tablet Take 1 tablet (20 mg total) by mouth daily.  Marland Kitchen lisinopril (ZESTRIL) 10 MG tablet Take 1 tablet (10 mg total) by mouth daily.  . metFORMIN (GLUCOPHAGE) 500 MG tablet TAKE ONE-HALF TABLET BY MOUTH TWICE DAILY  . VITAMIN D PO Take by mouth daily.   No facility-administered encounter medications on file as of 06/26/2020.    Allergies (verified) Patient has no known allergies.   History: Past Medical History:  Diagnosis Date  . Diabetes mellitus without complication ([place])   . Hypertension   . Left knee pain 05/17/2014  . Morbid obesity ([place]) 02/15/2016   Past Surgical History:  Procedure Laterality Date  . ABDOMINAL HYSTERECTOMY     complete  . ACHILLES TENDON SURGERY Left   . COLONOSCOPY WITH PROPOFOL N/A 11/03/2018   Procedure: COLONOSCOPY WITH PROPOFOL;  Surgeon: Lin Landsman, MD;  Location: [facility] ENDOSCOPY;  Service: Gastroenterology;  Laterality: N/A;  . HERNIA REPAIR  11/2014  .  INSERTION OF MESH N/A 11/23/2014   Procedure: INSERTION OF MESH;  Surgeon: Sherri Rad, MD;  Location: [facility] ORS;  Service: General;  Laterality: N/A;  . JOINT REPLACEMENT Left 12/18/2018  . VENTRAL HERNIA REPAIR N/A 11/23/2014   Procedure: HERNIA REPAIR VENTRAL ADULT;  Surgeon: Sherri Rad, MD;  Location: [facility] ORS;  Service: General;  Laterality: N/A;   Family History  Problem Relation Age of  Onset  . Hypertension Mother   . Diabetes Mother   . Kidney disease Mother   . Hypertension Father   . Diabetes Father   . Heart failure Father   . Hypertension Son   . Heart disease Sister   . Heart disease Brother   . Breast cancer Maternal Aunt    Social History   Socioeconomic History  . Marital status: Single    Spouse name: Not on file  . Number of children: Not on file  . Years of education: Not on file  . Highest education level: Not on file  Occupational History  . Occupation: disability  Tobacco Use  . Smoking status: Never Smoker  . Smokeless tobacco: Never Used  Vaping Use  . Vaping Use: Never used  Substance and Sexual Activity  . Alcohol use: No  . Drug use: No  . Sexual activity: Not Currently  Other Topics Concern  . Not on file  Social History Narrative  . Not on file   Social Determinants of Health   Financial Resource Strain: Low Risk   . Difficulty of Paying Living Expenses: Not hard at all  Food Insecurity: No Food Insecurity  . Worried About Charity fundraiser in the Last Year: Never true  . Ran Out of Food in the Last Year: Never true  Transportation Needs: No Transportation Needs  . Lack of Transportation (Medical): No  . Lack of Transportation (Non-Medical): No  Physical Activity: Inactive  . Days of Exercise per Week: 0 days  . Minutes of Exercise per Session: 0 min  Stress: No Stress Concern Present  . Feeling of Stress : Not at all  Social Connections: Not on file    Tobacco Counseling Counseling given: Not Answered   Clinical Intake:  Pre-visit preparation completed: Yes  Pain : No/denies pain     Nutritional Status: BMI > 30  Obese Nutritional Risks: None Diabetes: Yes  How often do you need to have someone help you when you read instructions, pamphlets, or other written materials from your doctor or pharmacy?: 1 - Never What is the last grade level you completed in school?: 12th grade  Diabetic? Yes Nutrition  Risk Assessment:  Has the patient had any N/V/D within the last 2 months?  No  Does the patient have any non-healing wounds?  No  Has the patient had any unintentional weight loss or weight gain?  No   Diabetes:  Is the patient diabetic?  Yes  If diabetic, was a CBG obtained today?  No  Did the patient bring in their glucometer from home?  No  How often do you monitor your CBG's? Does not.   Financial Strains and Diabetes Management:  Are you having any financial strains with the device, your supplies or your medication? No .  Does the patient want to be seen by Chronic Care Management for management of their diabetes?  No  Would the patient like to be referred to a Nutritionist or for Diabetic Management?  No   Diabetic Exams:  Diabetic Eye Exam: Completed 07/26/2019 Diabetic  Foot Exam: Completed 03/31/2020   Interpreter Needed?: No  Information entered by :: NAllen LPN   Activities of Daily Living In your present state of health, do you have any difficulty performing the following activities: 06/26/2020  Hearing? N  Vision? N  Difficulty concentrating or making decisions? N  Walking or climbing stairs? N  Dressing or bathing? N  Doing errands, shopping? N  Preparing Food and eating ? N  Using the Toilet? N  In the past six months, have you accidently leaked urine? N  Do you have problems with loss of bowel control? N  Managing your Medications? N  Managing your Finances? N  Housekeeping or managing your Housekeeping? N  Some recent data might be hidden    Patient Care Team: Venita Lick, NP as PCP - General (Nurse Practitioner)  Indicate any recent Medical Services you may have received from other than [facility] providers in the past year (date may be approximate).     Assessment:   This is a routine wellness examination for Jillian Pierce.  Hearing/Vision screen No exam data present  Dietary issues and exercise activities discussed: Current Exercise Habits: The  patient does not participate in regular exercise at present  Goals    . Patient Stated     06/26/2020, no goals      Depression Screen PHQ 2/9 Scores 06/26/2020 06/17/2019 02/12/2019 08/25/2017  PHQ - 2 Score 0 0 0 0    Fall Risk Fall Risk  06/26/2020 04/10/2020 06/17/2019 07/03/2018 08/25/2017  Falls in the past year? 0 0 0 0 No  Number falls in past yr: - 0 0 - -  Injury with Fall? - 0 0 - -  Risk for fall due to : Medication side effect - - - -  Follow up Falls evaluation completed;Education provided;Falls prevention discussed - - - -    FALL RISK PREVENTION PERTAINING TO THE HOME:  Any stairs in or around the home? No  If so, are there any without handrails? n/a Home free of loose throw rugs in walkways, pet beds, electrical cords, etc? Yes  Adequate lighting in your home to reduce risk of falls? Yes   ASSISTIVE DEVICES UTILIZED TO PREVENT FALLS:  Life alert? No  Use of a cane, walker or w/c? Yes  Grab bars in the bathroom? No  Shower chair or bench in shower? No  Elevated toilet seat or a handicapped toilet? No   TIMED UP AND GO:  Was the test performed? No .     Cognitive Function:     6CIT Screen 06/26/2020  What Year? 0 points  What month? 0 points  What time? 0 points  Count back from 20 0 points  Months in reverse 4 points  Repeat phrase 10 points  Total Score 14    Immunizations Immunization History  Administered Date(s) Administered  . Fluad Quad(high Dose 65+) 12/28/2019  . Influenza Inj Mdck Quad Pf 02/15/2016  . Influenza,inj,Quad PF,6+ Mos 01/13/2018, 02/12/2019  . Influenza-Unspecified 02/25/2013, 02/21/2015  . PFIZER(Purple Top)SARS-COV-2 Vaccination 07/13/2019, 08/03/2019  . Pneumococcal Conjugate-13 12/28/2019  . Pneumococcal Polysaccharide-23 01/13/2018    TDAP status: Due, Education has been provided regarding the importance of this vaccine. Advised may receive this vaccine at local pharmacy or Health Dept. Aware to provide a copy of the  vaccination record if obtained from local pharmacy or Health Dept. Verbalized acceptance and understanding.  Flu Vaccine status: Up to date  Pneumococcal vaccine status: Up to date  Covid-19 vaccine status:  Completed vaccines  Qualifies for Shingles Vaccine? Yes   Zostavax completed No   Shingrix Completed?: No.    Education has been provided regarding the importance of this vaccine. Patient has been advised to call insurance company to determine out of pocket expense if they have not yet received this vaccine. Advised may also receive vaccine at local pharmacy or Health Dept. Verbalized acceptance and understanding.  Screening Tests Health Maintenance  Topic Date Due  . TETANUS/TDAP  Never done  . DEXA SCAN  Never done  . COVID-19 Vaccine (3 - Booster for Pfizer series) 02/02/2020  . OPHTHALMOLOGY EXAM  07/25/2020  . HEMOGLOBIN A1C  10/09/2020  . FOOT EXAM  04/10/2021  . MAMMOGRAM  12/28/2021  . PNA vac Low Risk Adult (2 of 2 - PPSV23) 01/14/2023  . COLONOSCOPY (Pts 45-34yrs Insurance coverage will need to be confirmed)  11/02/2025  . INFLUENZA VACCINE  Completed  . Hepatitis C Screening  Completed  . HPV VACCINES  Aged Out    Health Maintenance  Health Maintenance Due  Topic Date Due  . TETANUS/TDAP  Never done  . DEXA SCAN  Never done  . COVID-19 Vaccine (3 - Booster for Pfizer series) 02/02/2020    Colorectal cancer screening: Type of screening: Colonoscopy. Completed 11/03/2018. Repeat every 7 years  Mammogram status: Completed 01/07/2020. Repeat every year  Bone Density status: due  Lung Cancer Screening: (Low Dose CT Chest recommended if Age 12-80 years, 30 pack-year currently smoking OR have quit w/in 15years.) does not qualify.   Lung Cancer Screening Referral: no  Additional Screening:  Hepatitis C Screening: does qualify; Completed 04/10/2020  Vision Screening: Recommended annual ophthalmology exams for early detection of glaucoma and other disorders of  the eye. Is the patient up to date with their annual eye exam?  Yes  Who is the provider or what is the name of the office in which the patient attends annual eye exams? [facility] If pt is not established with a provider, would they like to be referred to a provider to establish care? No .   Dental Screening: Recommended annual dental exams for proper oral hygiene  Community Resource Referral / Chronic Care Management: CRR required this visit?  No   CCM required this visit?  No      Plan:     I have personally reviewed and noted the following in the patient's chart:   . Medical and social history . Use of alcohol, tobacco or illicit drugs  . Current medications and supplements . Functional ability and status . Nutritional status . Physical activity . Advanced directives . List of other physicians . Hospitalizations, surgeries, and ER visits in previous 12 months . Vitals . Screenings to include cognitive, depression, and falls . Referrals and appointments  In addition, I have reviewed and discussed with patient certain preventive protocols, quality metrics, and best practice recommendations. A written personalized care plan for preventive services as well as general preventive health recommendations were provided to patient.     Kellie Simmering, LPN   10/28/9379   Nurse Notes:

## 2020-06-26 NOTE — Patient Instructions (Signed)
Jillian Pierce , Thank you for taking time to come for your Medicare Wellness Visit. I appreciate your ongoing commitment to your health goals. Please review the following plan we discussed and let me know if I can assist you in the future.   Screening recommendations/referrals: Colonoscopy: completed 11/03/2018, due 11/02/2025 Mammogram: completed 01/07/2020 Bone Density: due Recommended yearly ophthalmology/optometry visit for glaucoma screening and checkup Recommended yearly dental visit for hygiene and checkup  Vaccinations: Influenza vaccine: completed 12/28/2019, due 11/20/2020 Pneumococcal vaccine: completed 01/13/2018 Tdap vaccine: due Shingles vaccine: discussed   Covid-19:06/14/2020, 08/03/2019, 07/13/2019  Advanced directives: Advance directive discussed with you today.   Conditions/risks identified: none  Next appointment: Follow up in one year for your annual wellness visit    Preventive Care 65 Years and Older, Female Preventive care refers to lifestyle choices and visits with your health care provider that can promote health and wellness. What does preventive care include?  A yearly physical exam. This is also called an annual well check.  Dental exams once or twice a year.  Routine eye exams. Ask your health care provider how often you should have your eyes checked.  Personal lifestyle choices, including:  Daily care of your teeth and gums.  Regular physical activity.  Eating a healthy diet.  Avoiding tobacco and drug use.  Limiting alcohol use.  Practicing safe sex.  Taking low-dose aspirin every day.  Taking vitamin and mineral supplements as recommended by your health care provider. What happens during an annual well check? The services and screenings done by your health care provider during your annual well check will depend on your age, overall health, lifestyle risk factors, and family history of disease. Counseling  Your health care provider may ask you  questions about your:  Alcohol use.  Tobacco use.  Drug use.  Emotional well-being.  Home and relationship well-being.  Sexual activity.  Eating habits.  History of falls.  Memory and ability to understand (cognition).  Work and work Statistician.  Reproductive health. Screening  You may have the following tests or measurements:  Height, weight, and BMI.  Blood pressure.  Lipid and cholesterol levels. These may be checked every 5 years, or more frequently if you are over 64 years old.  Skin check.  Lung cancer screening. You may have this screening every year starting at age 14 if you have a 30-pack-year history of smoking and currently smoke or have quit within the past 15 years.  Fecal occult blood test (FOBT) of the stool. You may have this test every year starting at age 76.  Flexible sigmoidoscopy or colonoscopy. You may have a sigmoidoscopy every 5 years or a colonoscopy every 10 years starting at age 69.  Hepatitis C blood test.  Hepatitis B blood test.  Sexually transmitted disease (STD) testing.  Diabetes screening. This is done by checking your blood sugar (glucose) after you have not eaten for a while (fasting). You may have this done every 1-3 years.  Bone density scan. This is done to screen for osteoporosis. You may have this done starting at age 41.  Mammogram. This may be done every 1-2 years. Talk to your health care provider about how often you should have regular mammograms. Talk with your health care provider about your test results, treatment options, and if necessary, the need for more tests. Vaccines  Your health care provider may recommend certain vaccines, such as:  Influenza vaccine. This is recommended every year.  Tetanus, diphtheria, and acellular pertussis (Tdap, Td)  vaccine. You may need a Td booster every 10 years.  Zoster vaccine. You may need this after age 57.  Pneumococcal 13-valent conjugate (PCV13) vaccine. One dose is  recommended after age 61.  Pneumococcal polysaccharide (PPSV23) vaccine. One dose is recommended after age 8. Talk to your health care provider about which screenings and vaccines you need and how often you need them. This information is not intended to replace advice given to you by your health care provider. Make sure you discuss any questions you have with your health care provider. Document Released: 05/05/2015 Document Revised: 12/27/2015 Document Reviewed: 02/07/2015 Elsevier Interactive Patient Education  2017 [place] Prevention in the Home Falls can cause injuries. They can happen to people of all ages. There are many things you can do to make your home safe and to help prevent falls. What can I do on the outside of my home?  Regularly fix the edges of walkways and driveways and fix any cracks.  Remove anything that might make you trip as you walk through a door, such as a raised step or threshold.  Trim any bushes or trees on the path to your home.  Use bright outdoor lighting.  Clear any walking paths of anything that might make someone trip, such as rocks or tools.  Regularly check to see if handrails are loose or broken. Make sure that both sides of any steps have handrails.  Any raised decks and porches should have guardrails on the edges.  Have any leaves, snow, or ice cleared regularly.  Use sand or salt on walking paths during winter.  Clean up any spills in your garage right away. This includes oil or grease spills. What can I do in the bathroom?  Use night lights.  Install grab bars by the toilet and in the tub and shower. Do not use towel bars as grab bars.  Use non-skid mats or decals in the tub or shower.  If you need to sit down in the shower, use a plastic, non-slip stool.  Keep the floor dry. Clean up any water that spills on the floor as soon as it happens.  Remove soap buildup in the tub or shower regularly.  Attach bath mats  securely with double-sided non-slip rug tape.  Do not have throw rugs and other things on the floor that can make you trip. What can I do in the bedroom?  Use night lights.  Make sure that you have a light by your bed that is easy to reach.  Do not use any sheets or blankets that are too big for your bed. They should not hang down onto the floor.  Have a firm chair that has side arms. You can use this for support while you get dressed.  Do not have throw rugs and other things on the floor that can make you trip. What can I do in the kitchen?  Clean up any spills right away.  Avoid walking on wet floors.  Keep items that you use a lot in easy-to-reach places.  If you need to reach something above you, use a strong step stool that has a grab bar.  Keep electrical cords out of the way.  Do not use floor polish or wax that makes floors slippery. If you must use wax, use non-skid floor wax.  Do not have throw rugs and other things on the floor that can make you trip. What can I do with my stairs?  Do not leave  any items on the stairs.  Make sure that there are handrails on both sides of the stairs and use them. Fix handrails that are broken or loose. Make sure that handrails are as long as the stairways.  Check any carpeting to make sure that it is firmly attached to the stairs. Fix any carpet that is loose or worn.  Avoid having throw rugs at the top or bottom of the stairs. If you do have throw rugs, attach them to the floor with carpet tape.  Make sure that you have a light switch at the top of the stairs and the bottom of the stairs. If you do not have them, ask someone to add them for you. What else can I do to help prevent falls?  Wear shoes that:  Do not have high heels.  Have rubber bottoms.  Are comfortable and fit you well.  Are closed at the toe. Do not wear sandals.  If you use a stepladder:  Make sure that it is fully opened. Do not climb a closed  stepladder.  Make sure that both sides of the stepladder are locked into place.  Ask someone to hold it for you, if possible.  Clearly mark and make sure that you can see:  Any grab bars or handrails.  First and last steps.  Where the edge of each step is.  Use tools that help you move around (mobility aids) if they are needed. These include:  Canes.  Walkers.  Scooters.  Crutches.  Turn on the lights when you go into a dark area. Replace any light bulbs as soon as they burn out.  Set up your furniture so you have a clear path. Avoid moving your furniture around.  If any of your floors are uneven, fix them.  If there are any pets around you, be aware of where they are.  Review your medicines with your doctor. Some medicines can make you feel dizzy. This can increase your chance of falling. Ask your doctor what other things that you can do to help prevent falls. This information is not intended to replace advice given to you by your health care provider. Make sure you discuss any questions you have with your health care provider. Document Released: 02/02/2009 Document Revised: 09/14/2015 Document Reviewed: 05/13/2014 Elsevier Interactive Patient Education  2017 [employer].

## 2020-07-11 ENCOUNTER — Other Ambulatory Visit: Payer: Self-pay

## 2020-07-11 ENCOUNTER — Encounter: Payer: Self-pay | Admitting: Nurse Practitioner

## 2020-07-11 ENCOUNTER — Ambulatory Visit (INDEPENDENT_AMBULATORY_CARE_PROVIDER_SITE_OTHER): Payer: Medicare HMO | Admitting: Nurse Practitioner

## 2020-07-11 VITALS — BP 116/76 | HR 61 | Temp 98.9°F | Wt 248.6 lb

## 2020-07-11 DIAGNOSIS — E1159 Type 2 diabetes mellitus with other circulatory complications: Secondary | ICD-10-CM | POA: Diagnosis not present

## 2020-07-11 DIAGNOSIS — I152 Hypertension secondary to endocrine disorders: Secondary | ICD-10-CM

## 2020-07-11 DIAGNOSIS — E559 Vitamin D deficiency, unspecified: Secondary | ICD-10-CM

## 2020-07-11 DIAGNOSIS — Z780 Asymptomatic menopausal state: Secondary | ICD-10-CM

## 2020-07-11 DIAGNOSIS — E1169 Type 2 diabetes mellitus with other specified complication: Secondary | ICD-10-CM

## 2020-07-11 DIAGNOSIS — E538 Deficiency of other specified B group vitamins: Secondary | ICD-10-CM

## 2020-07-11 DIAGNOSIS — R011 Cardiac murmur, unspecified: Secondary | ICD-10-CM

## 2020-07-11 DIAGNOSIS — E785 Hyperlipidemia, unspecified: Secondary | ICD-10-CM

## 2020-07-11 NOTE — Assessment & Plan Note (Signed)
Asymptomatic. Will refer to cardiology if she develops symptoms or worsening murmur.

## 2020-07-11 NOTE — Assessment & Plan Note (Signed)
Chronic, stable. No side effects from medication. Continue lipitor and aspirin. Check lipid panel today.

## 2020-07-11 NOTE — Assessment & Plan Note (Signed)
BMI today 45.47. Weight is down 5 pounds from last visit, congratulated her. Still encouraging to eat small meals with high protein and low fat along with getting exercise.

## 2020-07-11 NOTE — Assessment & Plan Note (Signed)
Vitamin B12 checked today.

## 2020-07-11 NOTE — Assessment & Plan Note (Signed)
Chronic, stable. Blood pressure today 116/76. A1C is 6.7% which is below goal. No episodes of hypoglycemia. Urine microalbumin negative today. Continue current regimen. No refills needed at this time. Check CMP and CBC today. Follow-up in 3 months.

## 2020-07-11 NOTE — Assessment & Plan Note (Signed)
Will check vitamin D today. Has not had dexa scan, will order this to screen for osteoporosis.

## 2020-07-11 NOTE — Patient Instructions (Signed)
It was great to see you!  Continue your current medications. We will call you with your lab results within the next couple days once they result.   Let's follow-up in 3 months, sooner if you have concerns.   Take care,  Vance Peper, NP

## 2020-07-11 NOTE — Progress Notes (Signed)
Established Patient Office Visit  Subjective:  Patient ID: Jillian Pierce, female    DOB: [birth year]  Age: 65 y.o. MRN: [id number]  CC:  Chief Complaint  Patient presents with  . Diabetes  . Hyperlipidemia  . Hypertension  . Follow-up    Patient denies having any problems or concerns at today's visit.    HPI Jillian Pierce presents for follow-up on diabetes, hypertension, and hyperlipidemia.  DIABETES  Hypoglycemic episodes:no Polydipsia/polyuria: no Visual disturbance: no Chest pain: no Paresthesias: no Glucose Monitoring: no  Accucheck frequency: Not Checking  Fasting glucose:  Post prandial:  Evening:  Before meals: Taking Insulin?: no  Long acting insulin:  Short acting insulin: Blood Pressure Monitoring: not checking Retinal Examination: Up to Date Foot Exam: Up to Date Diabetic Education: Completed Pneumovax: Up to Date Influenza: Up to Date Aspirin: yes   HYPERTENSION / HYPERLIPIDEMIA  Satisfied with current treatment? yes Duration of hypertension: chronic BP monitoring frequency: not checking BP range:  BP medication side effects: no Past BP meds: amlodipine and lisinopril, clonidine Duration of hyperlipidemia: chronic Cholesterol medication side effects: no Cholesterol supplements: none Past cholesterol medications: atorvastain (lipitor) Medication compliance: excellent compliance Aspirin: yes Recent stressors: no Recurrent headaches: no Visual changes: no Palpitations: no Dyspnea: no Chest pain: no Lower extremity edema: no Dizzy/lightheaded: no  Past Medical History:  Diagnosis Date  . Diabetes mellitus without complication ([place])   . Hypertension   . Left knee pain 05/17/2014  . Morbid obesity ([place]) 02/15/2016    Past Surgical History:  Procedure Laterality Date  . ABDOMINAL HYSTERECTOMY     complete  . ACHILLES TENDON SURGERY Left   . COLONOSCOPY WITH PROPOFOL N/A 11/03/2018   Procedure: COLONOSCOPY WITH PROPOFOL;  Surgeon:  Lin Landsman, MD;  Location: [facility] ENDOSCOPY;  Service: Gastroenterology;  Laterality: N/A;  . HERNIA REPAIR  11/2014  . INSERTION OF MESH N/A 11/23/2014   Procedure: INSERTION OF MESH;  Surgeon: Sherri Rad, MD;  Location: [facility] ORS;  Service: General;  Laterality: N/A;  . JOINT REPLACEMENT Left 12/18/2018  . VENTRAL HERNIA REPAIR N/A 11/23/2014   Procedure: HERNIA REPAIR VENTRAL ADULT;  Surgeon: Sherri Rad, MD;  Location: [facility] ORS;  Service: General;  Laterality: N/A;    Family History  Problem Relation Age of Onset  . Hypertension Mother   . Diabetes Mother   . Kidney disease Mother   . Hypertension Father   . Diabetes Father   . Heart failure Father   . Hypertension Son   . Heart disease Sister   . Heart disease Brother   . Breast cancer Maternal Aunt     Social History   Socioeconomic History  . Marital status: Single    Spouse name: Not on file  . Number of children: Not on file  . Years of education: Not on file  . Highest education level: Not on file  Occupational History  . Occupation: disability  Tobacco Use  . Smoking status: Never Smoker  . Smokeless tobacco: Never Used  Vaping Use  . Vaping Use: Never used  Substance and Sexual Activity  . Alcohol use: No  . Drug use: No  . Sexual activity: Not Currently  Other Topics Concern  . Not on file  Social History Narrative  . Not on file   Social Determinants of Health   Financial Resource Strain: Low Risk   . Difficulty of Paying Living Expenses: Not hard at all  Food Insecurity: No Food Insecurity  . Worried  About Running Out of Food in the Last Year: Never true  . Ran Out of Food in the Last Year: Never true  Transportation Needs: No Transportation Needs  . Lack of Transportation (Medical): No  . Lack of Transportation (Non-Medical): No  Physical Activity: Inactive  . Days of Exercise per Week: 0 days  . Minutes of Exercise per Session: 0 min  Stress: No Stress Concern Present  . Feeling of Stress :  Not at all  Social Connections: Not on file  Intimate Partner Violence: Not on file    Outpatient Medications Prior to Visit  Medication Sig Dispense Refill  . albuterol (VENTOLIN HFA) 108 (90 Base) MCG/ACT inhaler Inhale 2 puffs into the lungs every 6 (six) hours as needed for wheezing or shortness of breath. 9 g 1  . amLODipine (NORVASC) 10 MG tablet Take 1 tablet (10 mg total) by mouth daily. 90 tablet 4  . Ascorbic Acid (VITAMIN C PO) Take by mouth daily.    Marland Kitchen aspirin EC 81 MG tablet Take 81 mg by mouth daily.    Marland Kitchen atorvastatin (LIPITOR) 10 MG tablet Take 1 tablet (10 mg total) by mouth daily. 90 tablet 3  . cloNIDine (CATAPRES) 0.3 MG tablet Take 1 tablet (0.3 mg total) by mouth 2 (two) times daily. 180 tablet 3  . Cyanocobalamin (VITAMIN B 12 PO) Take by mouth daily.    . cyclobenzaprine (FLEXERIL) 5 MG tablet Take 1 tablet (5 mg total) by mouth 2 (two) times daily. 30 tablet 0  . fluticasone (FLONASE) 50 MCG/ACT nasal spray Place 2 sprays into both nostrils daily. 16 g 6  . furosemide (LASIX) 20 MG tablet Take 1 tablet (20 mg total) by mouth daily. 90 tablet 4  . lisinopril (ZESTRIL) 10 MG tablet Take 1 tablet (10 mg total) by mouth daily. 90 tablet 3  . metFORMIN (GLUCOPHAGE) 500 MG tablet TAKE ONE-HALF TABLET BY MOUTH TWICE DAILY 180 tablet 4  . VITAMIN D PO Take by mouth daily.     No facility-administered medications prior to visit.    No Known Allergies  ROS Review of Systems  Constitutional: Positive for fatigue.  HENT: Negative.   Eyes: Negative.   Respiratory: Negative.   Cardiovascular: Negative.   Gastrointestinal: Negative.   Endocrine: Negative.   Genitourinary: Negative.   Musculoskeletal: Positive for arthralgias (right hand pain, chronic, stable).  Neurological: Negative.   Psychiatric/Behavioral: Negative.       Objective:    Physical Exam Vitals and nursing note reviewed.  Constitutional:      General: She is not in acute distress.     Appearance: Normal appearance. She is obese.  HENT:     Head: Normocephalic and atraumatic.  Eyes:     Conjunctiva/sclera: Conjunctivae normal.  Neck:     Vascular: No carotid bruit.  Cardiovascular:     Rate and Rhythm: Normal rate and regular rhythm.     Pulses: Normal pulses.     Heart sounds: Murmur heard.    Pulmonary:     Effort: Pulmonary effort is normal.     Breath sounds: Normal breath sounds.  Abdominal:     Palpations: Abdomen is soft.     Tenderness: There is no abdominal tenderness.  Musculoskeletal:     Cervical back: Normal range of motion.     Right lower leg: No edema.     Left lower leg: No edema.  Skin:    General: Skin is warm and dry.  Neurological:  General: No focal deficit present.     Mental Status: She is alert and oriented to person, place, and time.  Psychiatric:        Mood and Affect: Mood normal.        Behavior: Behavior normal.        Thought Content: Thought content normal.        Judgment: Judgment normal.     BP 116/76   Pulse 61   Temp 98.9 F (37.2 C) (Oral)   Wt 248 lb 9.6 oz (112.8 kg)   SpO2 97%   BMI 45.47 kg/m  Wt Readings from Last 3 Encounters:  07/11/20 248 lb 9.6 oz (112.8 kg)  06/26/20 245 lb (111.1 kg)  04/10/20 253 lb (114.8 kg)     Health Maintenance Due  Topic Date Due  . TETANUS/TDAP  Never done  . DEXA SCAN  Never done    There are no preventive care reminders to display for this patient.  Lab Results  Component Value Date   TSH 0.920 12/28/2019   Lab Results  Component Value Date   WBC 5.6 04/10/2020   HGB 11.4 04/10/2020   HCT 34.8 04/10/2020   MCV 81 04/10/2020   PLT 461 (H) 04/10/2020   Lab Results  Component Value Date   NA 140 04/10/2020   K 4.1 04/10/2020   CO2 22 04/10/2020   GLUCOSE 115 (H) 04/10/2020   BUN 14 04/10/2020   CREATININE 0.96 04/10/2020   BILITOT 0.3 08/20/2019   ALKPHOS 92 08/20/2019   AST 16 08/20/2019   ALT 9 08/20/2019   PROT 7.4 08/20/2019   ALBUMIN  4.5 08/20/2019   CALCIUM 9.5 04/10/2020   ANIONGAP 8 11/22/2014   Lab Results  Component Value Date   CHOL 129 12/28/2019   Lab Results  Component Value Date   HDL 45 12/28/2019   Lab Results  Component Value Date   LDLCALC 67 12/28/2019   Lab Results  Component Value Date   TRIG 89 12/28/2019   Lab Results  Component Value Date   CHOLHDL 3.8 01/13/2018   Lab Results  Component Value Date   HGBA1C 6.4 04/10/2020      Assessment & Plan:   Problem List Items Addressed This Visit      Cardiovascular and Mediastinum   Hypertension associated with diabetes ([place]) - Primary    Chronic, stable. Blood pressure today 116/76. A1C is 6.7% which is below goal. No episodes of hypoglycemia. Urine microalbumin negative today. Continue current regimen. No refills needed at this time. Check CMP and CBC today. Follow-up in 3 months.      Relevant Orders   Comp Met (CMET)   CBC with Differential   Bayer DCA Hb A1c Waived   Microalbumin, Urine Waived     Endocrine   Hyperlipidemia associated with type 2 diabetes mellitus (HCC)    Chronic, stable. No side effects from medication. Continue lipitor and aspirin. Check lipid panel today.      Relevant Orders   Bayer DCA Hb A1c Waived   Lipid Panel w/o Chol/HDL Ratio     Other   Morbid obesity (HCC)    BMI today 45.47. Weight is down 5 pounds from last visit, congratulated her. Still encouraging to eat small meals with high protein and low fat along with getting exercise.       Vitamin D deficiency    Will check vitamin D today. Has not had dexa scan, will order this to screen for  osteoporosis.       Relevant Orders   Vitamin D (25 hydroxy)   Vitamin B12 deficiency    Vitamin B12 checked today.      Relevant Orders   Vitamin B12   Heart murmur    Asymptomatic. Will refer to cardiology if she develops symptoms or worsening murmur.       Other Visit Diagnoses    Postmenopausal estrogen deficiency       bone density  ordered today. Has not had one in the past.   Relevant Orders   DG Bone Density      No orders of the defined types were placed in this encounter.   Follow-up: Return in about 3 months (around 10/11/2020) for diabetes.    Charyl Dancer, NP

## 2020-07-12 LAB — CBC WITH DIFFERENTIAL/PLATELET
Basophils Absolute: 0 10*3/uL (ref 0.0–0.2)
Basos: 1 %
EOS (ABSOLUTE): 0.3 10*3/uL (ref 0.0–0.4)
Eos: 5 %
Hematocrit: 37.5 % (ref 34.0–46.6)
Hemoglobin: 12 g/dL (ref 11.1–15.9)
Immature Grans (Abs): 0 10*3/uL (ref 0.0–0.1)
Immature Granulocytes: 0 %
Lymphocytes Absolute: 1.6 10*3/uL (ref 0.7–3.1)
Lymphs: 26 %
MCH: 26.5 pg — ABNORMAL LOW (ref 26.6–33.0)
MCHC: 32 g/dL (ref 31.5–35.7)
MCV: 83 fL (ref 79–97)
Monocytes Absolute: 0.5 10*3/uL (ref 0.1–0.9)
Monocytes: 9 %
Neutrophils Absolute: 3.7 10*3/uL (ref 1.4–7.0)
Neutrophils: 59 %
Platelets: 433 10*3/uL (ref 150–450)
RBC: 4.53 x10E6/uL (ref 3.77–5.28)
RDW: 14.4 % (ref 11.7–15.4)
WBC: 6.1 10*3/uL (ref 3.4–10.8)

## 2020-07-12 LAB — MICROALBUMIN, URINE WAIVED
Creatinine, Urine Waived: 50 mg/dL (ref 10–300)
Microalb, Ur Waived: 10 mg/L (ref 0–19)
Microalb/Creat Ratio: <30 mg/g (ref <30)

## 2020-07-12 LAB — COMPREHENSIVE METABOLIC PANEL
ALT: 11 IU/L (ref 0–32)
AST: 18 IU/L (ref 0–40)
Albumin/Globulin Ratio: 1.4 (ref 1.2–2.2)
Albumin: 4.7 g/dL (ref 3.8–4.8)
Alkaline Phosphatase: 103 IU/L (ref 44–121)
BUN/Creatinine Ratio: 18 (ref 12–28)
BUN: 17 mg/dL (ref 8–27)
Bilirubin Total: 0.3 mg/dL (ref 0.0–1.2)
CO2: 18 mmol/L — ABNORMAL LOW (ref 20–29)
Calcium: 10.3 mg/dL (ref 8.7–10.3)
Chloride: 101 mmol/L (ref 96–106)
Creatinine, Ser: 0.94 mg/dL (ref 0.57–1.00)
Globulin, Total: 3.3 g/dL (ref 1.5–4.5)
Glucose: 112 mg/dL — ABNORMAL HIGH (ref 65–99)
Potassium: 4.3 mmol/L (ref 3.5–5.2)
Sodium: 140 mmol/L (ref 134–144)
Total Protein: 8 g/dL (ref 6.0–8.5)
eGFR: 67 mL/min/{1.73_m2} (ref >59)

## 2020-07-12 LAB — LIPID PANEL W/O CHOL/HDL RATIO
Cholesterol, Total: 134 mg/dL (ref 100–199)
HDL: 52 mg/dL (ref >39)
LDL Chol Calc (NIH): 69 mg/dL (ref 0–99)
Triglycerides: 65 mg/dL (ref 0–149)
VLDL Cholesterol Cal: 13 mg/dL (ref 5–40)

## 2020-07-12 LAB — BAYER DCA HB A1C WAIVED: HB A1C (BAYER DCA - WAIVED): 6.7 % (ref <7.0)

## 2020-07-12 LAB — VITAMIN D 25 HYDROXY (VIT D DEFICIENCY, FRACTURES): Vit D, 25-Hydroxy: 31.8 ng/mL (ref 30.0–100.0)

## 2020-07-12 LAB — VITAMIN B12: Vitamin B-12: 1983 pg/mL — ABNORMAL HIGH (ref 232–1245)

## 2020-07-14 ENCOUNTER — Ambulatory Visit
Admission: RE | Admit: 2020-07-14 | Discharge: 2020-07-14 | Disposition: A | Discharge status: Home / Self Care | Payer: Medicare HMO | Source: Ambulatory Visit | Attending: Nurse Practitioner | Admitting: Nurse Practitioner

## 2020-07-14 ENCOUNTER — Other Ambulatory Visit: Payer: Self-pay

## 2020-07-14 DIAGNOSIS — N6320 Unspecified lump in the left breast, unspecified quadrant: Secondary | ICD-10-CM | POA: Diagnosis present

## 2020-07-14 DIAGNOSIS — R928 Other abnormal and inconclusive findings on diagnostic imaging of breast: Secondary | ICD-10-CM | POA: Insufficient documentation

## 2020-10-11 ENCOUNTER — Ambulatory Visit (INDEPENDENT_AMBULATORY_CARE_PROVIDER_SITE_OTHER): Payer: Medicare HMO | Admitting: Nurse Practitioner

## 2020-10-11 ENCOUNTER — Encounter: Payer: Self-pay | Admitting: Nurse Practitioner

## 2020-10-11 ENCOUNTER — Other Ambulatory Visit: Payer: Self-pay

## 2020-10-11 VITALS — BP 112/75 | HR 81 | Temp 98.6°F | Wt 256.2 lb

## 2020-10-11 DIAGNOSIS — E1129 Type 2 diabetes mellitus with other diabetic kidney complication: Secondary | ICD-10-CM

## 2020-10-11 DIAGNOSIS — E1140 Type 2 diabetes mellitus with diabetic neuropathy, unspecified: Secondary | ICD-10-CM

## 2020-10-11 DIAGNOSIS — M25552 Pain in left hip: Secondary | ICD-10-CM | POA: Insufficient documentation

## 2020-10-11 DIAGNOSIS — E785 Hyperlipidemia, unspecified: Secondary | ICD-10-CM

## 2020-10-11 DIAGNOSIS — E1159 Type 2 diabetes mellitus with other circulatory complications: Secondary | ICD-10-CM

## 2020-10-11 DIAGNOSIS — I152 Hypertension secondary to endocrine disorders: Secondary | ICD-10-CM

## 2020-10-11 DIAGNOSIS — E1169 Type 2 diabetes mellitus with other specified complication: Secondary | ICD-10-CM

## 2020-10-11 DIAGNOSIS — Z23 Encounter for immunization: Secondary | ICD-10-CM

## 2020-10-11 DIAGNOSIS — R011 Cardiac murmur, unspecified: Secondary | ICD-10-CM

## 2020-10-11 DIAGNOSIS — R809 Proteinuria, unspecified: Secondary | ICD-10-CM

## 2020-10-11 LAB — BAYER DCA HB A1C WAIVED: HB A1C (BAYER DCA - WAIVED): 6.6 % (ref <7.0)

## 2020-10-11 MED ORDER — OXYCODONE-ACETAMINOPHEN 5-325 MG PO TABS: 1.0000 | ORAL_TABLET | ORAL | 0 refills | Status: AC | PRN

## 2020-10-11 MED ORDER — PREDNISONE 10 MG PO TABS: ORAL_TABLET | ORAL | 0 refills | Status: DC

## 2020-10-11 MED ORDER — KETOROLAC TROMETHAMINE 60 MG/2ML IM SOLN
60.0000 mg | Freq: Once | INTRAMUSCULAR | Status: AC
  Administered 2020-10-11: 60 mg via INTRAMUSCULAR

## 2020-10-11 NOTE — Assessment & Plan Note (Addendum)
Chronic, ongoing with recent A1C below goal at 6.6%.  March urine ALB 10, will continue ACE.  Continue current medication regimen, Lisinopril for kidney protection.  Recommend checking BS three mornings a week and documenting for provider.  A1C next visit.  Will adjust regimen as needed based on results.  Return in 3 months.

## 2020-10-11 NOTE — Assessment & Plan Note (Signed)
BMI 46.86, with some weight gain present.  Recommended eating smaller high protein, low fat meals more frequently and exercising 30 mins a day 5 times a week with a goal of 10-15lb weight loss in the next 3 months. Patient voiced their understanding and motivation to adhere to these recommendations.

## 2020-10-11 NOTE — Patient Instructions (Signed)
Hip Pain The hip is the joint between the upper legs and the lower pelvis. The bones, cartilage, tendons, and muscles of your hip joint support your body and allow you to move around. Hip pain can range from a minor ache to severe pain in one or both of your hips. The pain may be felt on the inside of the hip joint near the groin, or on the outside near the buttocks and upper thigh. You may also have swelling or stiffness in your hip area. Follow these instructions at home: Managing pain, stiffness, and swelling   If directed, put ice on the painful area. To do this: Put ice in a plastic bag. Place a towel between your skin and the bag. Leave the ice on for 20 minutes, 2-3 times a day. If directed, apply heat to the affected area as often as told by your health care provider. Use the heat source that your health care provider recommends, such as a moist heat pack or a heating pad. Place a towel between your skin and the heat source. Leave the heat on for 20-30 minutes. Remove the heat if your skin turns bright red. This is especially important if you are unable to feel pain, heat, or cold. You may have a greater risk of getting burned. Activity Do exercises as told by your health care provider. Avoid activities that cause pain. General instructions  Take over-the-counter and prescription medicines only as told by your health care provider. Keep a journal of your symptoms. Write down: How often you have hip pain. The location of your pain. What the pain feels like. What makes the pain worse. Sleep with a pillow between your legs on your most comfortable side. Keep all follow-up visits as told by your health care provider. This is important. Contact a health care provider if: You cannot put weight on your leg. Your pain or swelling continues or gets worse after one week. It gets harder to walk. You have a fever. Get help right away if: You fall. You have a sudden increase in pain and  swelling in your hip. Your hip is red or swollen or very tender to touch. Summary Hip pain can range from a minor ache to severe pain in one or both of your hips. The pain may be felt on the inside of the hip joint near the groin, or on the outside near the buttocks and upper thigh. Avoid activities that cause pain. Write down how often you have hip pain, the location of the pain, what makes it worse, and what it feels like. This information is not intended to replace advice given to you by your health care provider. Make sure you discuss any questions you have with your health care provider. Document Revised: 08/24/2018 Document Reviewed: 08/24/2018 Elsevier Patient Education  2022 [employer]vier Inc.  

## 2020-10-11 NOTE — Assessment & Plan Note (Addendum)
Acute over past 3 weeks.  Will place referral to ortho, suspect some OA present due to weight.  Recommend modest weight loss focus.  Prednisone taper, A1c at goal and recommend she monitor sugars with this as may have elevation, and 5 days of Percocet sent to pharmacy.  Will provide Toradol shot in office today, due to current pain level.  Return in 4 weeks, suspect will need hip steroid injection and PT.

## 2020-10-11 NOTE — Assessment & Plan Note (Signed)
Chronic, ongoing.  Continue current medication regimen and adjust as needed.  Lipid panel next visit. Return in 3 months.

## 2020-10-11 NOTE — Assessment & Plan Note (Signed)
Chronic, ongoing with recent A1C levels below goal, today 6.6%.  Continue current medication regimen and adjust as needed. Continue daily B12 supplement for low levels in past.  Consider Gabapentin if worsening pain presents.  Return in 3 months.

## 2020-10-11 NOTE — Assessment & Plan Note (Signed)
Chronic, ongoing.  BP at goal in office today.  Continue current medication regimen and adjust as needed.  Recommend checking BP at home three mornings a week and documenting for provider + focus on DASH diet.  CMP next visit.  Return in 3 months.

## 2020-10-11 NOTE — Progress Notes (Signed)
BP 112/75   Pulse 81   Temp 98.6 F (37 C) (Oral)   Wt 256 lb 3.2 oz (116.2 kg)   SpO2 97%   BMI 46.86 kg/m    Subjective:    Patient ID: Jillian Pierce, female    DOB: 23-Feb-1956, 65 y.o.   MRN: [id number]  HPI: Jillian Pierce is a 65 y.o. female  Chief Complaint  Patient presents with   Diabetes    Patient states she has not had a recent Diabetic Eye exam. Patient states she has to get scheduled it is time.    Hip Pain    Patient states she is having a constant pain in her left hip that has been an ongoing issue for her, but it has gotten worse over time. Patient states she takes Ibuprofen or Tylenol to help, but it hasn't been helping.   DIABETES Last A1C 6.7% in March.  Continues on Metformin 250 MG BID. Hypoglycemic episodes:no Polydipsia/polyuria: no Visual disturbance: no Chest pain: no Paresthesias: no Glucose Monitoring: no             Accucheck frequency: Not Checking             Fasting glucose:             Post prandial:             Evening:             Before meals: Taking Insulin?: no             Long acting insulin:             Short acting insulin: Blood Pressure Monitoring: not checking Retinal Examination: Not Up to Date -- [place] Exam: Up to Date Pneumovax: Up to Date Influenza: Up to Date Aspirin: yes    HYPERTENSION / HYPERLIPIDEMIA Continues on Lipitor and Norvasc, Lisinopril, Clonidine, and Lasix.  Recent echo on 02/28/20 noted EF 60-65% and moderate LVH with mild aortic valve sclerosis.   Satisfied with current treatment? yes Duration of hypertension: chronic BP monitoring frequency: not checking BP range:  BP medication side effects: no Duration of hyperlipidemia: chronic Cholesterol medication side effects: no Cholesterol supplements: none Medication compliance: good compliance Aspirin: yes Recent stressors: no Recurrent headaches: no Visual changes: no Palpitations: no Dyspnea: at times with long distance  only Chest pain: no Lower extremity edema: at baseline Dizzy/lightheaded: no  HIP PAIN Has had for 2-3 weeks -- left hip -- history of left knee replacement in 2021 to that side.  Can not get comfortable chair, recliner, or bed.  Lack of sleep. Duration: weeks Involved hip: left  Mechanism of injury: unknown Location: lateral Onset: sudden  Severity: 7/10  Quality: sharp, aching, and throbbing Frequency: constant Radiation: no Aggravating factors: walking, movement, and prolonged sitting   Alleviating factors: nothing  Status: worse Treatments attempted: rest, heat, APAP, and ibuprofen   Relief with NSAIDs?: no Weakness with weight bearing: yes Weakness with walking: yes Paresthesias / decreased sensation: no Swelling: no Redness:no Fevers: no   Relevant past medical, surgical, family and social history reviewed and updated as indicated. Interim medical history since our last visit reviewed. Allergies and medications reviewed and updated.  Review of Systems  Constitutional:  Negative for activity change, appetite change, diaphoresis, fatigue and fever.  Respiratory:  Negative for cough, chest tightness, shortness of breath and wheezing.   Cardiovascular:  Negative for chest pain, palpitations and leg swelling.  Gastrointestinal: Negative.  Endocrine: Negative for cold intolerance, heat intolerance, polydipsia, polyphagia and polyuria.  Musculoskeletal:  Positive for arthralgias.  Neurological: Negative.   Psychiatric/Behavioral: Negative.     Per HPI unless specifically indicated above     Objective:    BP 112/75   Pulse 81   Temp 98.6 F (37 C) (Oral)   Wt 256 lb 3.2 oz (116.2 kg)   SpO2 97%   BMI 46.86 kg/m   Wt Readings from Last 3 Encounters:  10/11/20 256 lb 3.2 oz (116.2 kg)  07/11/20 248 lb 9.6 oz (112.8 kg)  06/26/20 245 lb (111.1 kg)    Physical Exam Vitals and nursing note reviewed.  Constitutional:      General: She is awake. She is not in  acute distress.    Appearance: She is well-developed and well-groomed. She is morbidly obese. She is not ill-appearing.  HENT:     Head: Normocephalic.     Right Ear: Hearing normal.     Left Ear: Hearing normal.  Eyes:     General: Lids are normal.        Right eye: No discharge.        Left eye: No discharge.     Conjunctiva/sclera: Conjunctivae normal.     Pupils: Pupils are equal, round, and reactive to light.  Neck:     Thyroid: No thyromegaly.     Vascular: No carotid bruit.  Cardiovascular:     Rate and Rhythm: Normal rate and regular rhythm.     Heart sounds: Murmur heard.  Systolic murmur is present with a grade of 2/6.    No gallop.  Pulmonary:     Effort: Pulmonary effort is normal. No accessory muscle usage or respiratory distress.     Breath sounds: Normal breath sounds.  Abdominal:     General: Bowel sounds are normal.     Palpations: Abdomen is soft.  Musculoskeletal:     Cervical back: Normal range of motion and neck supple.     Right hip: Normal.     Left hip: Tenderness present. No deformity or bony tenderness. Decreased range of motion. Decreased strength.     Right lower leg: 1+ Edema present.     Left lower leg: 1+ Edema present.  Skin:    General: Skin is warm and dry.  Neurological:     Mental Status: She is alert and oriented to person, place, and time.  Psychiatric:        Attention and Perception: Attention normal.        Mood and Affect: Mood normal.        Speech: Speech normal.        Behavior: Behavior normal. Behavior is cooperative.        Thought Content: Thought content normal.    Results for orders placed or performed in visit on 10/11/20  Bayer DCA Hb A1c Waived  Result Value Ref Range   HB A1C (BAYER DCA - WAIVED) 6.6 <7.0 %      Assessment & Plan:   Problem List Items Addressed This Visit       Cardiovascular and Mediastinum   Hypertension associated with diabetes ([place])    Chronic, ongoing.  BP at goal in office today.   Continue current medication regimen and adjust as needed.  Recommend checking BP at home three mornings a week and documenting for provider + focus on DASH diet.  CMP next visit.  Return in 3 months.  Endocrine   Type 2 diabetes mellitus with diabetic neuropathy, unspecified (HCC)    Chronic, ongoing with recent A1C levels below goal, today 6.6%.  Continue current medication regimen and adjust as needed. Continue daily B12 supplement for low levels in past.  Consider Gabapentin if worsening pain presents.  Return in 3 months.       Relevant Orders   Bayer DCA Hb A1c Waived (Completed)   Hyperlipidemia associated with type 2 diabetes mellitus (HCC)    Chronic, ongoing.  Continue current medication regimen and adjust as needed.  Lipid panel next visit. Return in 3 months.         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  March urine ALB 10, will continue ACE.  Continue current medication regimen, Lisinopril for kidney protection.  Recommend checking BS three mornings a week and documenting for provider.  A1C next visit.  Will adjust regimen as needed based on results.  Return in 3 months.         Other   Morbid obesity (HCC)    BMI 46.86, with some weight gain present.  Recommended eating smaller high protein, low fat meals more frequently and exercising 30 mins a day 5 times a week with a goal of 10-15lb weight loss in the next 3 months. Patient voiced their understanding and motivation to adhere to these recommendations.        Heart murmur    Asymptomatic. Will refer to cardiology if she develops symptoms or worsening murmur.       Left hip pain    Acute over past 3 weeks.  Will place referral to ortho, suspect some OA present due to weight.  Recommend modest weight loss focus.  Prednisone taper, A1c at goal and recommend she monitor sugars with this as may have elevation, and 5 days of Percocet sent to  pharmacy.  Will provide Toradol shot in office today, due to current pain level.  Return in 4 weeks, suspect will need hip steroid injection and PT.       Relevant Orders   Ambulatory referral to Orthopedics   Other Visit Diagnoses     Need for Tdap vaccination       Tdap in office today.   Relevant Orders   Tdap vaccine greater than or equal to 7yo IM (Completed)        Follow up plan: Return in about 4 weeks (around 11/08/2020) for Left hip pain.

## 2020-10-11 NOTE — Assessment & Plan Note (Signed)
Asymptomatic. Will refer to cardiology if she develops symptoms or worsening murmur.

## 2020-11-09 ENCOUNTER — Ambulatory Visit (INDEPENDENT_AMBULATORY_CARE_PROVIDER_SITE_OTHER): Payer: Medicare HMO | Admitting: Nurse Practitioner

## 2020-11-09 ENCOUNTER — Encounter: Payer: Self-pay | Admitting: Nurse Practitioner

## 2020-11-09 ENCOUNTER — Other Ambulatory Visit: Payer: Self-pay

## 2020-11-09 DIAGNOSIS — M25552 Pain in left hip: Secondary | ICD-10-CM

## 2020-11-09 MED ORDER — GABAPENTIN 600 MG PO TABS: 300.0000 mg | ORAL_TABLET | Freq: Every day | ORAL | 4 refills | Status: DC

## 2020-11-09 NOTE — Progress Notes (Signed)
BP 117/73   Pulse 61   Temp 98.4 F (36.9 C) (Oral)   Wt 252 lb 6.4 oz (114.5 kg)   SpO2 95%   BMI 46.16 kg/m    Subjective:    Patient ID: Jillian Pierce, female    DOB: 12-19-1955, 65 y.o.   MRN: [id number]  HPI: Jillian Pierce is a 65 y.o. female  Chief Complaint  Patient presents with   Hip Pain    Patient states she started physical therapy yesterday and states she feels about the same. Patient states she is having a little bit of pain today, but not much. Patient states she stopped taking Robaxin due to the medication caused her leg to get real tight and jittery feeling. Patient states the Physical Therapist advised her to take the medication until completed.    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  Can not get comfortable chair, recliner, or bed.  Lack of sleep.  Is going to ortho, started yesterday with PT via [facility].  Is seeing Dr. Ronnald Ramp at [facility] -- she reports he said pain is coming from back -- is going to "back doctor" next week.  She stopped taking Robaxin, as this made her leg to get jittery and feel tight.  She is still having pain, "just a little bit, not as bad, is getting better".  She reports she is also having cramps in both her lower legs.  Has tried mustard at home, it helps a little once in system.   Duration: weeks Involved hip: left  Mechanism of injury: unknown Location: lateral Onset: sudden  Severity: 7/10 today, tolerable Quality: aching and throbbing Frequency: intermittent -- can not stand for long periods Radiation: sometimes to top of left leg above knee Aggravating factors: walking and movement  Alleviating factors: nothing  Status: worse Treatments attempted: rest, heat, APAP -- Meloxicam Relief with NSAIDs?: no Weakness with weight bearing: yes Weakness with walking: yes Paresthesias / decreased sensation: no Swelling: no Redness:no Fevers:  no   Relevant past medical, surgical, family and social history reviewed and updated as indicated. Interim medical history since our last visit reviewed. Allergies and medications reviewed and updated.  Review of Systems  Constitutional:  Negative for activity change, appetite change, diaphoresis, fatigue and fever.  Respiratory:  Negative for cough, chest tightness, shortness of breath and wheezing.   Cardiovascular:  Negative for chest pain, palpitations and leg swelling.  Gastrointestinal: Negative.   Endocrine: Negative for cold intolerance, heat intolerance, polydipsia, polyphagia and polyuria.  Musculoskeletal:  Positive for arthralgias.  Neurological: Negative.   Psychiatric/Behavioral: Negative.     Per HPI unless specifically indicated above     Objective:    BP 117/73   Pulse 61   Temp 98.4 F (36.9 C) (Oral)   Wt 252 lb 6.4 oz (114.5 kg)   SpO2 95%   BMI 46.16 kg/m   Wt Readings from Last 3 Encounters:  11/09/20 252 lb 6.4 oz (114.5 kg)  10/11/20 256 lb 3.2 oz (116.2 kg)  07/11/20 248 lb 9.6 oz (112.8 kg)    Physical Exam Vitals and nursing note reviewed.  Constitutional:      General: She is awake. She is not in acute distress.    Appearance: She is well-developed and well-groomed. She is morbidly obese. She is not ill-appearing.  HENT:     Head: Normocephalic.  Right Ear: Hearing normal.     Left Ear: Hearing normal.  Eyes:     General: Lids are normal.        Right eye: No discharge.        Left eye: No discharge.     Conjunctiva/sclera: Conjunctivae normal.     Pupils: Pupils are equal, round, and reactive to light.  Neck:     Thyroid: No thyromegaly.     Vascular: No carotid bruit.  Cardiovascular:     Rate and Rhythm: Normal rate and regular rhythm.     Heart sounds: Murmur heard.  Systolic murmur is present with a grade of 2/6.    No gallop.  Pulmonary:     Effort: Pulmonary effort is normal. No accessory muscle usage or respiratory  distress.     Breath sounds: Normal breath sounds.  Abdominal:     General: Bowel sounds are normal.     Palpations: Abdomen is soft.  Musculoskeletal:     Cervical back: Normal range of motion and neck supple.     Right hip: Normal.     Left hip: Tenderness present. No deformity or bony tenderness. Decreased range of motion. Decreased strength.     Right lower leg: 1+ Edema present.     Left lower leg: 1+ Edema present.  Skin:    General: Skin is warm and dry.  Neurological:     Mental Status: She is alert and oriented to person, place, and time.  Psychiatric:        Attention and Perception: Attention normal.        Mood and Affect: Mood normal.        Speech: Speech normal.        Behavior: Behavior normal. Behavior is cooperative.        Thought Content: Thought content normal.    Results for orders placed or performed in visit on 10/11/20  Bayer DCA Hb A1c Waived  Result Value Ref Range   HB A1C (BAYER DCA - WAIVED) 6.6 <7.0 %      Assessment & Plan:   Problem List Items Addressed This Visit       Other   Morbid obesity ([place]) - Primary    BMI 46.16 with T2DM, HTN.  Recommended eating smaller high protein, low fat meals more frequently and exercising 30 mins a day 5 times a week with a goal of 10-15lb weight loss in the next 3 months. Patient voiced their understanding and motivation to adhere to these recommendations.        Left hip pain    Ongoing with some improvement, following with ortho at this time.  Recommend modest weight loss focus.  Continue collaboration with ortho and current medication regimen as needed, will add on Gabapentin to take at night only for discomfort with some radiculopathy to legs bilaterally -- start out with 1/2 pill (300 MG).  Continue PT with ortho.  Return in October for physical and diabetes check, sooner if worsening.         Follow up plan: Return in about 3 months (around 02/12/2021) for Annual physical with diabetes check.

## 2020-11-09 NOTE — Patient Instructions (Signed)
[facility]  Address: [address], [place], [place] [address]  Phone: [phone]   Hip Pain The hip is the joint between the upper legs and the lower pelvis. The bones, cartilage, tendons, and muscles of your hip joint support your body and allowyou to move around. Hip pain can range from a minor ache to severe pain in one or both of your hips. The pain may be felt on the inside of the hip joint near the groin, or on the outside near the buttocks and upper thigh. You may also have swelling orstiffness in your hip area. Follow these instructions at home: Managing pain, stiffness, and swelling     If directed, put ice on the painful area. To do this: Put ice in a plastic bag. Place a towel between your skin and the bag. Leave the ice on for 20 minutes, 2-3 times a day. If directed, apply heat to the affected area as often as told by your health care provider. Use the heat source that your health care provider recommends, such as a moist heat pack or a heating pad. Place a towel between your skin and the heat source. Leave the heat on for 20-30 minutes. Remove the heat if your skin turns bright red. This is especially important if you are unable to feel pain, heat, or cold. You may have a greater risk of getting burned. Activity Do exercises as told by your health care provider. Avoid activities that cause pain. General instructions  Take over-the-counter and prescription medicines only as told by your health care provider. Keep a journal of your symptoms. Write down: How often you have hip pain. The location of your pain. What the pain feels like. What makes the pain worse. Sleep with a pillow between your legs on your most comfortable side. Keep all follow-up visits as told by your health care provider. This is important.  Contact a health care provider if: You cannot put weight on your leg. Your pain or swelling continues or gets worse  after one week. It gets harder to walk. You have a fever. Get help right away if: You fall. You have a sudden increase in pain and swelling in your hip. Your hip is red or swollen or very tender to touch. Summary Hip pain can range from a minor ache to severe pain in one or both of your hips. The pain may be felt on the inside of the hip joint near the groin, or on the outside near the buttocks and upper thigh. Avoid activities that cause pain. Write down how often you have hip pain, the location of the pain, what makes it worse, and what it feels like. This information is not intended to replace advice given to you by your health care provider. Make sure you discuss any questions you have with your healthcare provider. Document Revised: 08/24/2018 Document Reviewed: 08/24/2018 Elsevier Patient Education  [place].

## 2020-11-09 NOTE — Assessment & Plan Note (Signed)
Ongoing with some improvement, following with ortho at this time.  Recommend modest weight loss focus.  Continue collaboration with ortho and current medication regimen as needed, will add on Gabapentin to take at night only for discomfort with some radiculopathy to legs bilaterally -- start out with 1/2 pill (300 MG).  Continue PT with ortho.  Return in October for physical and diabetes check, sooner if worsening.

## 2020-11-09 NOTE — Assessment & Plan Note (Signed)
BMI 46.16 with T2DM, HTN.  Recommended eating smaller high protein, low fat meals more frequently and exercising 30 mins a day 5 times a week with a goal of 10-15lb weight loss in the next 3 months. Patient voiced their understanding and motivation to adhere to these recommendations.

## 2020-12-11 ENCOUNTER — Telehealth: Payer: Self-pay | Admitting: Nurse Practitioner

## 2020-12-11 ENCOUNTER — Other Ambulatory Visit: Payer: Self-pay | Admitting: Nurse Practitioner

## 2020-12-11 DIAGNOSIS — Z1231 Encounter for screening mammogram for malignant neoplasm of breast: Secondary | ICD-10-CM

## 2020-12-11 DIAGNOSIS — N6320 Unspecified lump in the left breast, unspecified quadrant: Secondary | ICD-10-CM

## 2020-12-11 DIAGNOSIS — R928 Other abnormal and inconclusive findings on diagnostic imaging of breast: Secondary | ICD-10-CM

## 2020-12-11 NOTE — Telephone Encounter (Signed)
Order for Dexa scan in chart already. Tried calling patient to let her know and to call [facility] to schedule. Patient did not answer and VM was not set up. Will try to call again later.

## 2020-12-11 NOTE — Telephone Encounter (Signed)
Pt is calling to request if a referral can be sent [facility]. Pt is requesting a bone density test. Please advise CB- [phone]

## 2020-12-12 NOTE — Telephone Encounter (Signed)
Called and notified patient that order is already placed in [place]. Advised patient to contact [facility] to schedule, patient verbalized understanding.

## 2020-12-29 ENCOUNTER — Other Ambulatory Visit: Payer: Medicare HMO

## 2021-01-03 ENCOUNTER — Ambulatory Visit
Admission: RE | Admit: 2021-01-03 | Discharge: 2021-01-03 | Disposition: A | Discharge status: Home / Self Care | Payer: Medicare HMO | Source: Ambulatory Visit | Attending: Nurse Practitioner | Admitting: Nurse Practitioner

## 2021-01-03 ENCOUNTER — Other Ambulatory Visit: Payer: Self-pay

## 2021-01-03 DIAGNOSIS — N6320 Unspecified lump in the left breast, unspecified quadrant: Secondary | ICD-10-CM

## 2021-01-03 DIAGNOSIS — Z1231 Encounter for screening mammogram for malignant neoplasm of breast: Secondary | ICD-10-CM

## 2021-01-03 DIAGNOSIS — Z780 Asymptomatic menopausal state: Secondary | ICD-10-CM | POA: Diagnosis present

## 2021-01-03 DIAGNOSIS — R928 Other abnormal and inconclusive findings on diagnostic imaging of breast: Secondary | ICD-10-CM | POA: Insufficient documentation

## 2021-02-08 ENCOUNTER — Encounter: Payer: Self-pay | Admitting: Nurse Practitioner

## 2021-02-08 DIAGNOSIS — M85852 Other specified disorders of bone density and structure, left thigh: Secondary | ICD-10-CM | POA: Insufficient documentation

## 2021-02-15 ENCOUNTER — Ambulatory Visit (INDEPENDENT_AMBULATORY_CARE_PROVIDER_SITE_OTHER): Payer: Medicare HMO | Admitting: Nurse Practitioner

## 2021-02-15 ENCOUNTER — Other Ambulatory Visit: Payer: Self-pay

## 2021-02-15 ENCOUNTER — Encounter: Payer: Self-pay | Admitting: Nurse Practitioner

## 2021-02-15 VITALS — BP 112/70 | HR 86 | Temp 99.2°F | Ht 60.0 in | Wt 255.8 lb

## 2021-02-15 DIAGNOSIS — E785 Hyperlipidemia, unspecified: Secondary | ICD-10-CM

## 2021-02-15 DIAGNOSIS — E1169 Type 2 diabetes mellitus with other specified complication: Secondary | ICD-10-CM

## 2021-02-15 DIAGNOSIS — E1129 Type 2 diabetes mellitus with other diabetic kidney complication: Secondary | ICD-10-CM

## 2021-02-15 DIAGNOSIS — E1140 Type 2 diabetes mellitus with diabetic neuropathy, unspecified: Secondary | ICD-10-CM

## 2021-02-15 DIAGNOSIS — E538 Deficiency of other specified B group vitamins: Secondary | ICD-10-CM

## 2021-02-15 DIAGNOSIS — Z0000 Encounter for general adult medical examination without abnormal findings: Secondary | ICD-10-CM | POA: Diagnosis not present

## 2021-02-15 DIAGNOSIS — R809 Proteinuria, unspecified: Secondary | ICD-10-CM | POA: Diagnosis not present

## 2021-02-15 DIAGNOSIS — Z23 Encounter for immunization: Secondary | ICD-10-CM | POA: Diagnosis not present

## 2021-02-15 DIAGNOSIS — E559 Vitamin D deficiency, unspecified: Secondary | ICD-10-CM

## 2021-02-15 DIAGNOSIS — R011 Cardiac murmur, unspecified: Secondary | ICD-10-CM

## 2021-02-15 DIAGNOSIS — E1159 Type 2 diabetes mellitus with other circulatory complications: Secondary | ICD-10-CM | POA: Diagnosis not present

## 2021-02-15 DIAGNOSIS — M85852 Other specified disorders of bone density and structure, left thigh: Secondary | ICD-10-CM

## 2021-02-15 DIAGNOSIS — Z7189 Other specified counseling: Secondary | ICD-10-CM

## 2021-02-15 DIAGNOSIS — I152 Hypertension secondary to endocrine disorders: Secondary | ICD-10-CM

## 2021-02-15 LAB — MICROALBUMIN, URINE WAIVED
Creatinine, Urine Waived: 300 mg/dL (ref 10–300)
Microalb, Ur Waived: 150 mg/L — ABNORMAL HIGH (ref 0–19)

## 2021-02-15 LAB — BAYER DCA HB A1C WAIVED: HB A1C (BAYER DCA - WAIVED): 6.4 % — ABNORMAL HIGH (ref 4.8–5.6)

## 2021-02-15 MED ORDER — SHINGRIX 50 MCG/0.5ML IM SUSR: 0.5000 mL | Freq: Once | INTRAMUSCULAR | 0 refills | Status: AC

## 2021-02-15 MED ORDER — GABAPENTIN 600 MG PO TABS: 300.0000 mg | ORAL_TABLET | Freq: Every day | ORAL | 4 refills | Status: DC

## 2021-02-15 MED ORDER — AMLODIPINE BESYLATE 10 MG PO TABS: 10.0000 mg | ORAL_TABLET | Freq: Every day | ORAL | 4 refills | Status: DC

## 2021-02-15 MED ORDER — ATORVASTATIN CALCIUM 10 MG PO TABS: 10.0000 mg | ORAL_TABLET | Freq: Every day | ORAL | 4 refills | Status: DC

## 2021-02-15 MED ORDER — METFORMIN HCL 500 MG PO TABS: ORAL_TABLET | ORAL | 4 refills | Status: DC

## 2021-02-15 MED ORDER — LISINOPRIL 10 MG PO TABS: 10.0000 mg | ORAL_TABLET | Freq: Every day | ORAL | 4 refills | Status: DC

## 2021-02-15 MED ORDER — CLONIDINE HCL 0.3 MG PO TABS: 0.3000 mg | ORAL_TABLET | Freq: Two times a day (BID) | ORAL | 4 refills | Status: DC

## 2021-02-15 MED ORDER — FUROSEMIDE 20 MG PO TABS: 20.0000 mg | ORAL_TABLET | Freq: Every day | ORAL | 4 refills | Status: DC

## 2021-02-15 NOTE — Assessment & Plan Note (Signed)
Asymptomatic. Will refer to cardiology if she develops symptoms or worsening murmur.

## 2021-02-15 NOTE — Assessment & Plan Note (Signed)
Chronic, ongoing with recent A1C below goal at 6.4%.  Urine ALB 150 today, will continue ACE.  Continue current medication regimen, Lisinopril for kidney protection.  Recommend checking BS three mornings a week and documenting for provider.  Will adjust regimen as needed based on results.  Return in 6 months.

## 2021-02-15 NOTE — Patient Instructions (Signed)
Diabetes Mellitus and Nutrition, Adult When you have diabetes, or diabetes mellitus, it is very important to have healthy eating habits because your blood sugar (glucose) levels are greatly affected by what you eat and drink. Eating healthy foods in the right amounts, at about the same times every day, can help you:  Control your blood glucose.  Lower your risk of heart disease.  Improve your blood pressure.  Reach or maintain a healthy weight. What can affect my meal plan? Every person with diabetes is different, and each person has different needs for a meal plan. Your health care provider may recommend that you work with a dietitian to make a meal plan that is best for you. Your meal plan may vary depending on factors such as:  The calories you need.  The medicines you take.  Your weight.  Your blood glucose, blood pressure, and cholesterol levels.  Your activity level.  Other health conditions you have, such as heart or kidney disease. How do carbohydrates affect me? Carbohydrates, also called carbs, affect your blood glucose level more than any other type of food. Eating carbs naturally raises the amount of glucose in your blood. Carb counting is a method for keeping track of how many carbs you eat. Counting carbs is important to keep your blood glucose at a healthy level, especially if you use insulin or take certain oral diabetes medicines. It is important to know how many carbs you can safely have in each meal. This is different for every person. Your dietitian can help you calculate how many carbs you should have at each meal and for each snack. How does alcohol affect me? Alcohol can cause a sudden decrease in blood glucose (hypoglycemia), especially if you use insulin or take certain oral diabetes medicines. Hypoglycemia can be a life-threatening condition. Symptoms of hypoglycemia, such as sleepiness, dizziness, and confusion, are similar to symptoms of having too much  alcohol.  Do not drink alcohol if: ? Your health care provider tells you not to drink. ? You are pregnant, may be pregnant, or are planning to become pregnant.  If you drink alcohol: ? Do not drink on an empty stomach. ? Limit how much you use to:  0-1 drink a day for women.  0-2 drinks a day for men. ? Be aware of how much alcohol is in your drink. In the [place] one drink equals one 12 oz bottle of beer (355 mL), one 5 oz glass of wine (148 mL), or one 1 oz glass of hard liquor (44 mL). ? Keep yourself hydrated with water, diet soda, or unsweetened iced tea.  Keep in mind that regular soda, juice, and other mixers may contain a lot of sugar and must be counted as carbs. What are tips for following this plan? Reading food labels  Start by checking the serving size on the "Nutrition Facts" label of packaged foods and drinks. The amount of calories, carbs, fats, and other nutrients listed on the label is based on one serving of the item. Many items contain more than one serving per package.  Check the total grams (g) of carbs in one serving. You can calculate the number of servings of carbs in one serving by dividing the total carbs by 15. For example, if a food has 30 g of total carbs per serving, it would be equal to 2 servings of carbs.  Check the number of grams (g) of saturated fats and trans fats in one serving. Choose foods that have   a low amount or none of these fats.  Check the number of milligrams (mg) of salt (sodium) in one serving. Most people should limit total sodium intake to less than 2,300 mg per day.  Always check the nutrition information of foods labeled as "low-fat" or "nonfat." These foods may be higher in added sugar or refined carbs and should be avoided.  Talk to your dietitian to identify your daily goals for nutrients listed on the label. Shopping  Avoid buying canned, pre-made, or processed foods. These foods tend to be high in fat, sodium, and added  sugar.  Shop around the outside edge of the grocery store. This is where you will most often find fresh fruits and vegetables, bulk grains, fresh meats, and fresh dairy. Cooking  Use low-heat cooking methods, such as baking, instead of high-heat cooking methods like deep frying.  Cook using healthy oils, such as olive, canola, or sunflower oil.  Avoid cooking with butter, cream, or high-fat meats. Meal planning  Eat meals and snacks regularly, preferably at the same times every day. Avoid going long periods of time without eating.  Eat foods that are high in fiber, such as fresh fruits, vegetables, beans, and whole grains. Talk with your dietitian about how many servings of carbs you can eat at each meal.  Eat 4-6 oz (112-168 g) of lean protein each day, such as lean meat, chicken, fish, eggs, or tofu. One ounce (oz) of lean protein is equal to: ? 1 oz (28 g) of meat, chicken, or fish. ? 1 egg. ?  cup (62 g) of tofu.  Eat some foods each day that contain healthy fats, such as avocado, nuts, seeds, and fish.   What foods should I eat? Fruits Berries. Apples. Oranges. Peaches. Apricots. Plums. Grapes. Mango. Papaya. Pomegranate. Kiwi. Cherries. Vegetables Lettuce. Spinach. Leafy greens, including kale, chard, collard greens, and mustard greens. Beets. Cauliflower. Cabbage. Broccoli. Carrots. Green beans. Tomatoes. Peppers. Onions. Cucumbers. Brussels sprouts. Grains Whole grains, such as whole-wheat or whole-grain bread, crackers, tortillas, cereal, and pasta. Unsweetened oatmeal. Quinoa. Brown or wild rice. Meats and other proteins Seafood. Poultry without skin. Lean cuts of poultry and beef. Tofu. Nuts. Seeds. Dairy Low-fat or fat-free dairy products such as milk, yogurt, and cheese. The items listed above may not be a complete list of foods and beverages you can eat. Contact a dietitian for more information. What foods should I avoid? Fruits Fruits canned with  syrup. Vegetables Canned vegetables. Frozen vegetables with butter or cream sauce. Grains Refined white flour and flour products such as bread, pasta, snack foods, and cereals. Avoid all processed foods. Meats and other proteins Fatty cuts of meat. Poultry with skin. Breaded or fried meats. Processed meat. Avoid saturated fats. Dairy Full-fat yogurt, cheese, or milk. Beverages Sweetened drinks, such as soda or iced tea. The items listed above may not be a complete list of foods and beverages you should avoid. Contact a dietitian for more information. Questions to ask a health care provider  Do I need to meet with a diabetes educator?  Do I need to meet with a dietitian?  What number can I call if I have questions?  When are the best times to check my blood glucose? Where to find more information:  American Diabetes Association: diabetes.org  Academy of Nutrition and Dietetics: www.eatright.org  National Institute of Diabetes and Digestive and Kidney Diseases: www.niddk.nih.gov  Association of Diabetes Care and Education Specialists: www.diabeteseducator.org Summary  It is important to have healthy eating   habits because your blood sugar (glucose) levels are greatly affected by what you eat and drink.  A healthy meal plan will help you control your blood glucose and maintain a healthy lifestyle.  Your health care provider may recommend that you work with a dietitian to make a meal plan that is best for you.  Keep in mind that carbohydrates (carbs) and alcohol have immediate effects on your blood glucose levels. It is important to count carbs and to use alcohol carefully. This information is not intended to replace advice given to you by your health care provider. Make sure you discuss any questions you have with your health care provider. Document Revised: 03/16/2019 Document Reviewed: 03/16/2019 Elsevier Patient Education  2021 [employer]  

## 2021-02-15 NOTE — Progress Notes (Signed)
BP 112/70   Pulse 86   Temp 99.2 F (37.3 C) (Oral)   Ht 5' (1.524 m)   Wt 255 lb 12.8 oz (116 kg)   SpO2 97%   BMI 49.96 kg/m    Subjective:    Patient ID: Jillian Pierce, female    DOB: 10-03-1955, 65 y.o.   MRN: [id number]  HPI: Jillian Pierce is a 65 y.o. female presenting on 02/15/2021 for comprehensive medical examination. Current medical complaints include:none  She currently lives with: self Menopausal Symptoms: no  OSTEOPENIA DEXA scan on 01/03/21 noted this: The BMD measured at Femur Neck Left is 0.735 g/cm2 with a T-score of -2.2.  Satisfied with current treatment?: yes Adequate calcium & vitamin D: yes Weight bearing exercises: yes   DIABETES Last A1C 6.6% in June.  Continues on Metformin 250 MG BID.  Takes Gabapentin for ongoing back & hip pain.  Continues on Vitamin D and B12 at home for history of low levels. Hypoglycemic episodes:no Polydipsia/polyuria: no Visual disturbance: no Chest pain: no Paresthesias: no Glucose Monitoring: no             Accucheck frequency: Not Checking             Fasting glucose:             Post prandial:             Evening:             Before meals: Taking Insulin?: no             Long acting insulin:             Short acting insulin: Blood Pressure Monitoring: not checking Retinal Examination: Not Up to Date -- [place] Exam: Up to Date Pneumovax: Up to Date Influenza: Up to Date Aspirin: yes    HYPERTENSION / HYPERLIPIDEMIA Continues on Lipitor and Norvasc, Lisinopril, Clonidine, and Lasix.   Recent echo on 02/28/20 noted EF 60-65% and moderate LVH with mild aortic valve sclerosis.   Satisfied with current treatment? yes Duration of hypertension: chronic BP monitoring frequency: not checking BP range:  BP medication side effects: no Duration of hyperlipidemia: chronic Cholesterol medication side effects: no Cholesterol supplements: none Medication compliance: good compliance Aspirin: yes Recent  stressors: no Recurrent headaches: no Visual changes: no Palpitations: no Dyspnea: at times with long distance only -- uses Albuterol and this helps Chest pain: no Lower extremity edema: at baseline Dizzy/lightheaded: no   Depression Screen done today and results listed below:  Depression screen [facility] 2/9 02/15/2021 06/26/2020 06/17/2019 02/12/2019 08/25/2017  Decreased Interest 0 0 0 0 0  Down, Depressed, Hopeless 0 0 0 0 0  PHQ - 2 Score 0 0 0 0 0  Altered sleeping 0 - - - -  Tired, decreased energy 0 - - - -  Change in appetite 0 - - - -  Feeling bad or failure about yourself  0 - - - -  Trouble concentrating 0 - - - -  Moving slowly or fidgety/restless 0 - - - -  Suicidal thoughts 0 - - - -  PHQ-9 Score 0 - - - -   Functional Status Survey: Is the patient deaf or have difficulty hearing?: No Does the patient have difficulty seeing, even when wearing glasses/contacts?: No Does the patient have difficulty concentrating, remembering, or making decisions?: No Does the patient have difficulty walking or climbing stairs?: No Does the patient have difficulty dressing  or bathing?: No Does the patient have difficulty doing errands alone such as visiting a doctor's office or shopping?: No   Fall Risk 06/17/2019 04/10/2020 06/26/2020 02/15/2021 02/15/2021  Falls in the past year? 0 0 0 0 0  Was there an injury with Fall? 0 0 - 0 0  Fall Risk Category Calculator 0 0 - 0 0  Fall Risk Category Low Low - Low Low  Patient Fall Risk Level Low fall risk - Low fall risk Low fall risk Low fall risk  Patient at Risk for Falls Due to - - Medication side effect No Fall Risks No Fall Risks  Fall risk Follow up - - Falls evaluation completed;Education provided;Falls prevention discussed Falls prevention discussed Falls evaluation completed    A voluntary discussion about advance care planning including the explanation and discussion of advance directives was extensively discussed  with the patient for 15  minutes with patient.  Explanation about the health care proxy and Living will was reviewed and packet with forms with explanation of how to fill them out was given.  During this discussion, the patient was able to identify a health care proxy as son, Dantonio, and plans to fill out the paperwork required.  Patient was offered a separate [place] visit for further assistance with forms.     Past Medical History:  Past Medical History:  Diagnosis Date   Diabetes mellitus without complication ([place])    Hypertension    Left knee pain 05/17/2014   Morbid obesity ([place]) 02/15/2016    Surgical History:  Past Surgical History:  Procedure Laterality Date   ABDOMINAL HYSTERECTOMY     complete   ACHILLES TENDON SURGERY Left    COLONOSCOPY WITH PROPOFOL N/A 11/03/2018   Procedure: COLONOSCOPY WITH PROPOFOL;  Surgeon: Lin Landsman, MD;  Location: [place];  Service: Gastroenterology;  Laterality: N/A;   HERNIA REPAIR  11/2014   INSERTION OF MESH N/A 11/23/2014   Procedure: INSERTION OF MESH;  Surgeon: Sherri Rad, MD;  Location: [facility];  Service: General;  Laterality: N/A;   JOINT REPLACEMENT Left 12/18/2018   VENTRAL HERNIA REPAIR N/A 11/23/2014   Procedure: HERNIA REPAIR VENTRAL ADULT;  Surgeon: Sherri Rad, MD;  Location: [facility];  Service: General;  Laterality: N/A;    Medications:  Current Outpatient Medications on File Prior to Visit  Medication Sig   albuterol (VENTOLIN HFA) 108 (90 Base) MCG/ACT inhaler Inhale 2 puffs into the lungs every 6 (six) hours as needed for wheezing or shortness of breath.   Ascorbic Acid (VITAMIN C PO) Take by mouth daily.   aspirin EC 81 MG tablet Take 81 mg by mouth daily.   Cyanocobalamin (VITAMIN B 12 PO) Take by mouth daily.   fluticasone (FLONASE) 50 MCG/ACT nasal spray Place 2 sprays into both nostrils daily.   meloxicam (MOBIC) 15 MG tablet Take 15 mg by mouth daily. As needed   oxyCODONE-acetaminophen (PERCOCET/ROXICET) 5-325 MG  tablet Take 1 tablet by mouth every 4 (four) hours as needed for severe pain.   predniSONE (DELTASONE) 10 MG tablet Take 6 tablets by mouth daily for 2 days, then reduce by 1 tablet every 2 days until gone   VITAMIN D PO Take by mouth daily.   No current facility-administered medications on file prior to visit.    Allergies:  No Known Allergies  Social History:  Social History   Socioeconomic History   Marital status: Single    Spouse name: Not on file   Number  of children: Not on file   Years of education: Not on file   Highest education level: Not on file  Occupational History   Occupation: disability  Tobacco Use   Smoking status: Never   Smokeless tobacco: Never  Vaping Use   Vaping Use: Never used  Substance and Sexual Activity   Alcohol use: No   Drug use: No   Sexual activity: Not Currently  Other Topics Concern   Not on file  Social History Narrative   Not on file   Social Determinants of Health   Financial Resource Strain: Low Risk    Difficulty of Paying Living Expenses: Not hard at all  Food Insecurity: No Food Insecurity   Worried About Charity fundraiser in the Last Year: Never true   [place] in the Last Year: Never true  Transportation Needs: No Transportation Needs   Lack of Transportation (Medical): No   Lack of Transportation (Non-Medical): No  Physical Activity: Inactive   Days of Exercise per Week: 0 days   Minutes of Exercise per Session: 0 min  Stress: No Stress Concern Present   Feeling of Stress : Not at all  Social Connections: Not on file  Intimate Partner Violence: Not on file   Social History   Tobacco Use  Smoking Status Never  Smokeless Tobacco Never   Social History   Substance and Sexual Activity  Alcohol Use No    Family History:  Family History  Problem Relation Age of Onset   Hypertension Mother    Diabetes Mother    Kidney disease Mother    Hypertension Father    Diabetes Father    Heart failure  Father    Heart disease Sister    Heart disease Brother    Hypertension Son    Breast cancer Maternal Aunt     Past medical history, surgical history, medications, allergies, family history and social history reviewed with patient today and changes made to appropriate areas of the chart.   ROS All other ROS negative except what is listed above and in the HPI.      Objective:    BP 112/70   Pulse 86   Temp 99.2 F (37.3 C) (Oral)   Ht 5' (1.524 m)   Wt 255 lb 12.8 oz (116 kg)   SpO2 97%   BMI 49.96 kg/m   Wt Readings from Last 3 Encounters:  02/15/21 255 lb 12.8 oz (116 kg)  11/09/20 252 lb 6.4 oz (114.5 kg)  10/11/20 256 lb 3.2 oz (116.2 kg)    Physical Exam Vitals and nursing note reviewed. Exam conducted with a chaperone present.  Constitutional:      General: She is awake. She is not in acute distress.    Appearance: She is well-developed and well-groomed. She is obese. She is not ill-appearing or toxic-appearing.  HENT:     Head: Normocephalic and atraumatic.     Right Ear: Hearing, tympanic membrane, ear canal and external ear normal. No drainage.     Left Ear: Hearing, tympanic membrane, ear canal and external ear normal. No drainage.     Nose: Nose normal.     Right Sinus: No maxillary sinus tenderness or frontal sinus tenderness.     Left Sinus: No maxillary sinus tenderness or frontal sinus tenderness.     Mouth/Throat:     Mouth: Mucous membranes are moist.     Pharynx: Oropharynx is clear. Uvula midline. No pharyngeal swelling, oropharyngeal exudate or  posterior oropharyngeal erythema.  Eyes:     General: Lids are normal.        Right eye: No discharge.        Left eye: No discharge.     Extraocular Movements: Extraocular movements intact.     Conjunctiva/sclera: Conjunctivae normal.     Pupils: Pupils are equal, round, and reactive to light.     Visual Fields: Right eye visual fields normal and left eye visual fields normal.  Neck:     Thyroid: No  thyromegaly.     Vascular: No carotid bruit.     Trachea: Trachea normal.  Cardiovascular:     Rate and Rhythm: Normal rate and regular rhythm.     Heart sounds: Murmur heard.  Systolic murmur is present with a grade of 2/6.    No gallop.  Pulmonary:     Effort: Pulmonary effort is normal. No accessory muscle usage or respiratory distress.     Breath sounds: Normal breath sounds.  Chest:  Breasts:    Right: Normal.     Left: Normal.  Abdominal:     General: Bowel sounds are normal.     Palpations: Abdomen is soft. There is no hepatomegaly or splenomegaly.     Tenderness: There is no abdominal tenderness.  Musculoskeletal:        General: Normal range of motion.     Cervical back: Normal range of motion and neck supple.     Right lower leg: No edema.     Left lower leg: No edema.  Lymphadenopathy:     Head:     Right side of head: No submental, submandibular, tonsillar, preauricular or posterior auricular adenopathy.     Left side of head: No submental, submandibular, tonsillar, preauricular or posterior auricular adenopathy.     Cervical: No cervical adenopathy.     Upper Body:     Right upper body: No supraclavicular, axillary or pectoral adenopathy.     Left upper body: No supraclavicular, axillary or pectoral adenopathy.  Skin:    General: Skin is warm and dry.     Capillary Refill: Capillary refill takes less than 2 seconds.     Findings: No rash.  Neurological:     Mental Status: She is alert and oriented to person, place, and time.     Gait: Gait is intact.     Deep Tendon Reflexes: Reflexes are normal and symmetric.     Reflex Scores:      Brachioradialis reflexes are 2+ on the right side and 2+ on the left side.      Patellar reflexes are 2+ on the right side and 2+ on the left side. Psychiatric:        Attention and Perception: Attention normal.        Mood and Affect: Mood normal.        Speech: Speech normal.        Behavior: Behavior normal. Behavior is  cooperative.        Thought Content: Thought content normal.        Judgment: Judgment normal.   Results for orders placed or performed in visit on 10/11/20  Bayer DCA Hb A1c Waived  Result Value Ref Range   HB A1C (BAYER DCA - WAIVED) 6.6 <7.0 %      Assessment & Plan:   Problem List Items Addressed This Visit       Cardiovascular and Mediastinum   Hypertension associated with diabetes ([place])    Chronic,  ongoing.  BP at goal in office today.  Continue current medication regimen and adjust as needed.  Recommend checking BP at home three mornings a week and documenting for provider + focus on DASH diet.  CMP, CBC, TSH today.  Return in 6 months.      Relevant Medications   amLODipine (NORVASC) 10 MG tablet   atorvastatin (LIPITOR) 10 MG tablet   cloNIDine (CATAPRES) 0.3 MG tablet   furosemide (LASIX) 20 MG tablet   lisinopril (ZESTRIL) 10 MG tablet   metFORMIN (GLUCOPHAGE) 500 MG tablet   Other Relevant Orders   Bayer DCA Hb A1c Waived   Comprehensive metabolic panel   CBC with Differential/Platelet   TSH     Endocrine   Type 2 diabetes mellitus with diabetic neuropathy, unspecified (HCC)    Chronic, ongoing with recent A1C levels below goal, today 6.4%.  Continue current medication regimen and adjust as needed. Continue daily B12 supplement for low levels in past.  Continue Gabapentin at night.  Return in 6 months.      Relevant Medications   atorvastatin (LIPITOR) 10 MG tablet   lisinopril (ZESTRIL) 10 MG tablet   metFORMIN (GLUCOPHAGE) 500 MG tablet   Other Relevant Orders   Bayer DCA Hb A1c Waived   Comprehensive metabolic panel   Hyperlipidemia associated with type 2 diabetes mellitus (HCC)    Chronic, ongoing.  Continue current medication regimen and adjust as needed.  Lipid panel today. Return in 6 months.        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  Urine ALB 150 today, will continue ACE.  Continue current medication regimen, Lisinopril for kidney protection.  Recommend checking BS three mornings a week and documenting for provider.  Will adjust regimen as needed based on results.  Return in 6 months.      Relevant Medications   atorvastatin (LIPITOR) 10 MG tablet   lisinopril (ZESTRIL) 10 MG tablet   metFORMIN (GLUCOPHAGE) 500 MG tablet   Other Relevant Orders   Bayer DCA Hb A1c Waived   Microalbumin, Urine Waived   Comprehensive metabolic panel     Musculoskeletal and Integument   Osteopenia of neck of left femur    Noted on DEXA 01/03/21 == continue Vitamin D supplement daily.  Recheck DEXA in 2027.      Relevant Orders   VITAMIN D 25 Hydroxy (Vit-D Deficiency, Fractures)     Other   Vitamin B12 deficiency    Ongoing and stable.  Vitamin B12 checked today and continue supplement.      Relevant Orders   Vitamin B12   Morbid obesity ([place])    BMI 49.96, with some weight gain present.  Recommended eating smaller high protein, low fat meals more frequently and exercising 30 mins a day 5 times a week with a goal of 10-15lb weight loss in the next 3 months. Patient voiced their understanding and motivation to adhere to these recommendations.       Relevant Medications   metFORMIN (GLUCOPHAGE) 500 MG tablet   Vitamin  D deficiency    Ongoing.  Will check vitamin D today and continue supplement.      Relevant Orders   VITAMIN D 25 Hydroxy (Vit-D Deficiency, Fractures)   Heart murmur    Asymptomatic. Will refer to cardiology if she develops symptoms or worsening murmur.      Advanced care planning/counseling discussion    A voluntary  discussion about advance care planning including the explanation and discussion of advance directives was extensively discussed  with the patient for 15 minutes with patient.  Explanation about the health care proxy and Living will was reviewed and packet with forms with explanation of how to fill them out was given.  During this discussion, the patient was able to identify a health care proxy as her son and plans to fill out the paperwork required.  Patient was offered a separate [place] visit for further assistance with forms.         Other Visit Diagnoses     Needs flu shot       Flu vaccine today given.   Relevant Orders   Flu Vaccine QUAD High Dose(Fluad) (Completed)   Encounter for annual physical exam       Annual physical today and health maintenance reviewed.        Follow up plan: Return in about 6 months (around 08/16/2021) for T2DM, HTN/HLD, VIT D.   LABORATORY TESTING:  - Pap smear: not applicable  IMMUNIZATIONS:   - Tdap: Tetanus vaccination status reviewed: last tetanus booster within 10 years. - Influenza: Up to date - Pneumovax: Up to date - Prevnar: Up to date - HPV: Not applicable - Zostavax vaccine:  order today  SCREENING: -Mammogram: Up to date  - Colonoscopy: Up to date  - Bone Density: Up to date  -Hearing Test: Not applicable  -Spirometry: Not applicable   PATIENT COUNSELING:   Advised to take 1 mg of folate supplement per day if capable of pregnancy.   Sexuality: Discussed sexually transmitted diseases, partner selection, use of condoms, avoidance of unintended pregnancy  and contraceptive alternatives.   Advised to avoid cigarette smoking.  I discussed with the patient that most people either abstain from alcohol or drink within safe limits (<=14/week and <=4 drinks/occasion for males, <=7/weeks and <= 3 drinks/occasion for females) and that the risk for alcohol disorders and other health effects rises proportionally with the  number of drinks per week and how often a drinker exceeds daily limits.  Discussed cessation/primary prevention of drug use and availability of treatment for abuse.   Diet: Encouraged to adjust caloric intake to maintain  or achieve ideal body weight, to reduce intake of dietary saturated fat and total fat, to limit sodium intake by avoiding high sodium foods and not adding table salt, and to maintain adequate dietary potassium and calcium preferably from fresh fruits, vegetables, and low-fat dairy products.    Stressed the importance of regular exercise  Injury prevention: Discussed safety belts, safety helmets, smoke detector, smoking near bedding or upholstery.   Dental health: Discussed importance of regular tooth brushing, flossing, and dental visits.    NEXT PREVENTATIVE PHYSICAL DUE IN 1 YEAR. Return in about 6 months (around 08/16/2021) for T2DM, HTN/HLD, VIT D.

## 2021-02-15 NOTE — Assessment & Plan Note (Signed)
Chronic, ongoing with recent A1C levels below goal, today 6.4%.  Continue current medication regimen and adjust as needed. Continue daily B12 supplement for low levels in past.  Continue Gabapentin at night.  Return in 6 months.

## 2021-02-15 NOTE — Assessment & Plan Note (Signed)
Chronic, ongoing.  BP at goal in office today.  Continue current medication regimen and adjust as needed.  Recommend checking BP at home three mornings a week and documenting for provider + focus on DASH diet.  CMP, CBC, TSH today.  Return in 6 months.

## 2021-02-15 NOTE — Assessment & Plan Note (Signed)
Noted on DEXA 01/03/21 == continue Vitamin D supplement daily.  Recheck DEXA in 2027. 

## 2021-02-15 NOTE — Assessment & Plan Note (Signed)
Chronic, ongoing.  Continue current medication regimen and adjust as needed.  Lipid panel today.  Return in 6 months. 

## 2021-02-15 NOTE — Assessment & Plan Note (Signed)
BMI 49.96, with some weight gain present.  Recommended eating smaller high protein, low fat meals more frequently and exercising 30 mins a day 5 times a week with a goal of 10-15lb weight loss in the next 3 months. Patient voiced their understanding and motivation to adhere to these recommendations.

## 2021-02-15 NOTE — Assessment & Plan Note (Signed)
Ongoing and stable.  Vitamin B12 checked today and continue supplement. 

## 2021-02-15 NOTE — Assessment & Plan Note (Signed)
A voluntary discussion about advance care planning including the explanation and discussion of advance directives was extensively discussed  with the patient for 15 minutes with patient.  Explanation about the health care proxy and Living will was reviewed and packet with forms with explanation of how to fill them out was given.  During this discussion, the patient was able to identify a health care proxy as her son and plans to fill out the paperwork required.  Patient was offered a separate [place] visit for further assistance with forms.

## 2021-02-15 NOTE — Assessment & Plan Note (Signed)
Ongoing.  Will check vitamin D today and continue supplement. 

## 2021-02-19 NOTE — Progress Notes (Signed)
Please check on labs with [facility] -- she is still missing some results and these were done last Thursday.  Thank you!!

## 2021-02-20 NOTE — Progress Notes (Signed)
Good morning stellar crew, please let Prestyn know some of her labs have returned but not all.  I am still waiting on a few pieces and have asked [place] staff to check on these since it has been a bit since labs drawn.  Kidney function has not returned yet.  Liver function looks good.  CBC shows no anemia, but mild elevation in platelets (what helps clot blood) which we will continue to monitor.  Thyroid is normal.  Vitamin D and B12 normal.  She may reduce her B12 supplement to 500 MCG daily.  Waiting on cholesterol levels.  I will alert her when remainder return.  Continue all medications and have a great day!! Keep being awesome!!  Thank you for allowing me to participate in your care.  I appreciate you. Kindest regards, Ralonda Tartt

## 2021-02-21 ENCOUNTER — Other Ambulatory Visit: Payer: Self-pay | Admitting: Nurse Practitioner

## 2021-02-21 DIAGNOSIS — E785 Hyperlipidemia, unspecified: Secondary | ICD-10-CM

## 2021-02-21 DIAGNOSIS — E1169 Type 2 diabetes mellitus with other specified complication: Secondary | ICD-10-CM

## 2021-02-21 LAB — CBC WITH DIFFERENTIAL/PLATELET
Basophils Absolute: 0 10*3/uL (ref 0.0–0.2)
Basos: 1 %
EOS (ABSOLUTE): 0.2 10*3/uL (ref 0.0–0.4)
Eos: 2 %
Hematocrit: 35.5 % (ref 34.0–46.6)
Hemoglobin: 11.6 g/dL (ref 11.1–15.9)
Immature Grans (Abs): 0 10*3/uL (ref 0.0–0.1)
Immature Granulocytes: 0 %
Lymphocytes Absolute: 1.6 10*3/uL (ref 0.7–3.1)
Lymphs: 21 %
MCH: 27.4 pg (ref 26.6–33.0)
MCHC: 32.7 g/dL (ref 31.5–35.7)
MCV: 84 fL (ref 79–97)
Monocytes Absolute: 0.6 10*3/uL (ref 0.1–0.9)
Monocytes: 7 %
Neutrophils Absolute: 5.4 10*3/uL (ref 1.4–7.0)
Neutrophils: 69 %
Platelets: 472 10*3/uL — ABNORMAL HIGH (ref 150–450)
RBC: 4.23 x10E6/uL (ref 3.77–5.28)
RDW: 13.2 % (ref 11.7–15.4)
WBC: 7.8 10*3/uL (ref 3.4–10.8)

## 2021-02-21 LAB — COMPREHENSIVE METABOLIC PANEL
AST: 14 IU/L (ref 0–40)
Albumin/Globulin Ratio: 1.7 (ref 1.2–2.2)
Albumin: 4.4 g/dL (ref 3.8–4.8)
Alkaline Phosphatase: 95 IU/L (ref 44–121)
BUN: 12 mg/dL (ref 8–27)
Bilirubin Total: 0.3 mg/dL (ref 0.0–1.2)
Chloride: 105 mmol/L (ref 96–106)
Globulin, Total: 2.6 g/dL (ref 1.5–4.5)
Potassium: 3.6 mmol/L (ref 3.5–5.2)
Sodium: 144 mmol/L (ref 134–144)
Total Protein: 7 g/dL (ref 6.0–8.5)

## 2021-02-21 LAB — LIPID PANEL W/O CHOL/HDL RATIO
Cholesterol, Total: 128 mg/dL (ref 100–199)
HDL: 48 mg/dL (ref >39)

## 2021-02-21 LAB — VITAMIN D 25 HYDROXY (VIT D DEFICIENCY, FRACTURES): Vit D, 25-Hydroxy: 31.3 ng/mL (ref 30.0–100.0)

## 2021-02-21 LAB — TSH: TSH: 1.02 u[IU]/mL (ref 0.450–4.500)

## 2021-02-21 LAB — VITAMIN B12: Vitamin B-12: >2000 pg/mL — ABNORMAL HIGH (ref 232–1245)

## 2021-02-21 NOTE — Addendum Note (Signed)
Addended byRudie Meyer on: 02/21/2021 01:50 PM   Modules accepted: Orders

## 2021-02-23 LAB — COMPREHENSIVE METABOLIC PANEL
ALT: 10 IU/L (ref 0–32)
AST: 13 IU/L (ref 0–40)
Albumin/Globulin Ratio: 1.5 (ref 1.2–2.2)
Albumin: 4.3 g/dL (ref 3.8–4.8)
Alkaline Phosphatase: 93 IU/L (ref 44–121)
BUN/Creatinine Ratio: 13 (ref 12–28)
BUN: 12 mg/dL (ref 8–27)
Bilirubin Total: 0.3 mg/dL (ref 0.0–1.2)
CO2: 18 mmol/L — ABNORMAL LOW (ref 20–29)
Calcium: 9.8 mg/dL (ref 8.7–10.3)
Chloride: 107 mmol/L — ABNORMAL HIGH (ref 96–106)
Creatinine, Ser: 0.89 mg/dL (ref 0.57–1.00)
Globulin, Total: 2.8 g/dL (ref 1.5–4.5)
Glucose: 169 mg/dL — ABNORMAL HIGH (ref 70–99)
Potassium: 3.6 mmol/L (ref 3.5–5.2)
Sodium: 146 mmol/L — ABNORMAL HIGH (ref 134–144)
Total Protein: 7.1 g/dL (ref 6.0–8.5)
eGFR: 71 mL/min/{1.73_m2} (ref >59)

## 2021-02-23 LAB — LIPID PANEL W/O CHOL/HDL RATIO
Cholesterol, Total: 125 mg/dL (ref 100–199)
HDL: 46 mg/dL (ref >39)
LDL Chol Calc (NIH): 60 mg/dL (ref 0–99)
Triglycerides: 99 mg/dL (ref 0–149)
VLDL Cholesterol Cal: 19 mg/dL (ref 5–40)

## 2021-02-23 NOTE — Progress Notes (Signed)
Please let [place] know the remainder of her labs have returned, cholesterol levels are at goal and kidney and liver function remain stable.  Sodium was a little elevated, recommend decrease sodium intake and increase water intake.  Any questions? Keep being awesome!!  Thank you for allowing me to participate in your care.  I appreciate you. Kindest regards, Zaydn Gutridge

## 2021-03-06 ENCOUNTER — Other Ambulatory Visit: Payer: Self-pay | Admitting: Nurse Practitioner

## 2021-03-06 ENCOUNTER — Telehealth: Payer: Self-pay | Admitting: Nurse Practitioner

## 2021-03-06 MED ORDER — ALBUTEROL SULFATE HFA 108 (90 BASE) MCG/ACT IN AERS: 2.0000 | INHALATION_SPRAY | Freq: Four times a day (QID) | RESPIRATORY_TRACT | 1 refills | Status: DC | PRN

## 2021-03-06 NOTE — Telephone Encounter (Signed)
Requested Prescriptions  Pending Prescriptions Disp Refills  . albuterol (VENTOLIN HFA) 108 (90 Base) MCG/ACT inhaler 9 g 1    Sig: Inhale 2 puffs into the lungs every 6 (six) hours as needed for wheezing or shortness of breath.     Pulmonology:  Beta Agonists Failed - 03/06/2021 12:14 PM      Failed - One inhaler should last at least one month. If the patient is requesting refills earlier, contact the patient to check for uncontrolled symptoms.      Passed - Valid encounter within last 12 months    Recent Outpatient Visits          2 weeks ago Diabetes mellitus with proteinuria ([place])   [place], [place] T, NP   3 months ago Morbid obesity ([place])   [place], [place] T, NP   4 months ago Diabetes mellitus with proteinuria ([place])   [place], Jolene T, NP   7 months ago Hypertension associated with diabetes ([place])   [place], Lauren A, NP   11 months ago Type 2 diabetes mellitus with diabetic neuropathy, without long-term current use of insulin ([place])   [place], Barbaraann Faster, NP      Future Appointments            Tomorrow [place], Barbaraann Faster, NP [place], [place]   In 3 months  [facility], [place]   In 5 months [place], Barbaraann Faster, NP [facility], [place]

## 2021-03-06 NOTE — Telephone Encounter (Signed)
Please see other notes regarding same concern.   Copied from [place] [phone]. Topic: General - Other >> Mar 06, 2021 11:09 AM Leward Quan A wrote: Reason for CRM: Patient called in to inform Marnee Guarneri that she have had a cough with chest congestion about 4 days now needing and Rx sent to her pharmacy please. Can be reached at  Ph# [phone]

## 2021-03-06 NOTE — Telephone Encounter (Signed)
Appointment scheduled.

## 2021-03-06 NOTE — Telephone Encounter (Signed)
Copied from [place] [id number]. Topic: Quick Communication - Rx Refill/Question >> Mar 06, 2021 11:11 AM Leward Quan A wrote: Medication: albuterol (VENTOLIN HFA) 108 (90 Base) MCG/ACT inhaler  Has the patient contacted their pharmacy? No. Called in for other things was told to call pharmacy first next time (Agent: If no, request that the patient contact the pharmacy for the refill. If patient does not wish to contact the pharmacy document the reason why and proceed with request.) (Agent: If yes, when and what did the pharmacy advise?)  Preferred Pharmacy (with phone number or street name): [facility] [place] - [address]  Phone:  [phone] Fax:  [phone]    Has the patient been seen for an appointment in the last year OR does the patient have an upcoming appointment? Yes.    Agent: Please be advised that RX refills may take up to 3 business days. We ask that you follow-up with your pharmacy.

## 2021-03-07 ENCOUNTER — Ambulatory Visit (INDEPENDENT_AMBULATORY_CARE_PROVIDER_SITE_OTHER): Payer: Medicare HMO | Admitting: Nurse Practitioner

## 2021-03-07 ENCOUNTER — Encounter: Payer: Self-pay | Admitting: Nurse Practitioner

## 2021-03-07 ENCOUNTER — Other Ambulatory Visit: Payer: Self-pay

## 2021-03-07 VITALS — BP 119/72 | HR 82 | Temp 98.3°F

## 2021-03-07 DIAGNOSIS — R051 Acute cough: Secondary | ICD-10-CM | POA: Insufficient documentation

## 2021-03-07 DIAGNOSIS — R052 Subacute cough: Secondary | ICD-10-CM | POA: Insufficient documentation

## 2021-03-07 LAB — VERITOR FLU A/B WAIVED
Influenza A: NEGATIVE
Influenza B: NEGATIVE

## 2021-03-07 MED ORDER — HYDROCOD POLST-CPM POLST ER 10-8 MG/5ML PO SUER: 5.0000 mL | Freq: Two times a day (BID) | ORAL | 0 refills | Status: DC | PRN

## 2021-03-07 MED ORDER — AMOXICILLIN-POT CLAVULANATE 875-125 MG PO TABS: 1.0000 | ORAL_TABLET | Freq: Two times a day (BID) | ORAL | 0 refills | Status: AC

## 2021-03-07 MED ORDER — PREDNISONE 20 MG PO TABS: 40.0000 mg | ORAL_TABLET | Freq: Every day | ORAL | 0 refills | Status: AC

## 2021-03-07 NOTE — Patient Instructions (Signed)

## 2021-03-07 NOTE — Assessment & Plan Note (Signed)
Acute > one week with symptoms, cough remains with sinus pressure.  At this time will obtain flu and Covid testing in office.  Scripts for Augmentin for 7 days, Prednisone for 5 days, and Tussionex sent in.  Recommend: - Increased rest - Increasing Fluids - Acetaminophen as needed for fever/pain.  - Salt water gargling, chloraseptic spray and throat lozenges - Mucinex.  - Saline sinus flushes or a neti pot.  - Humidifying the air Return to office for worsening or ongoing symptoms, if ongoing then will obtain imaging.

## 2021-03-07 NOTE — Progress Notes (Signed)
BP 119/72   Pulse 82   Temp 98.3 F (36.8 C) (Oral)   SpO2 97%    Subjective:    Patient ID: Jillian Pierce, female    DOB: 07-18-1955, 65 y.o.   MRN: [id number]  HPI: JAZZLIN Pierce is a 65 y.o. female  Chief Complaint  Patient presents with   Cough   Chest Congestion    Patient states last Thursday she started having some coughing and then she states as the days went by it got worse to where she would have sit up in a chair due to the drainage. Patient states she has tried off brand [employer] cough medicine and cough drops and states that neither helped her cough. Patient states she is coughing up a real greenish-yellowish phlegm.    UPPER RESPIRATORY TRACT INFECTION Started feeling bad last Thursday with sore throat and coughing.  Has not been sleeping due to cough.  Tested for Covid at home and was negative.  Has been Covid vaccinated + flu vaccine.   Worst symptom: cough Fever: no Cough: yes Shortness of breath: yes Wheezing: yes Chest pain: no Chest tightness: yes Chest congestion: yes Nasal congestion: yes Runny nose: yes Post nasal drip: yes Sneezing: no Sore throat: no Swollen glands: no Sinus pressure: yes Headache: yes Face pain: no Toothache: no Ear pain: none Ear pressure: none Eyes red/itching:no Eye drainage/crusting: no  Vomiting: no Rash: no Fatigue: yes Sick contacts:  sister was sick two weeks ago Strep contacts: no  Context: fluctuating Recurrent sinusitis: no Relief with OTC cold/cough medications: no  Treatments attempted: cough syrup    Relevant past medical, surgical, family and social history reviewed and updated as indicated. Interim medical history since our last visit reviewed. Allergies and medications reviewed and updated.  Review of Systems  Constitutional:  Positive for fatigue. Negative for activity change, appetite change, chills and fever.  HENT:  Positive for congestion, postnasal drip, rhinorrhea, sinus pressure and sinus  pain. Negative for ear discharge, ear pain, facial swelling, sneezing, sore throat and voice change.   Eyes:  Negative for pain and visual disturbance.  Respiratory:  Positive for cough, chest tightness, shortness of breath and wheezing.   Cardiovascular:  Negative for chest pain, palpitations and leg swelling.  Gastrointestinal: Negative.   Endocrine: Negative.   Musculoskeletal:  Positive for myalgias.  Neurological:  Positive for headaches. Negative for dizziness and numbness.  Psychiatric/Behavioral: Negative.     Per HPI unless specifically indicated above     Objective:    BP 119/72   Pulse 82   Temp 98.3 F (36.8 C) (Oral)   SpO2 97%   Wt Readings from Last 3 Encounters:  02/15/21 255 lb 12.8 oz (116 kg)  11/09/20 252 lb 6.4 oz (114.5 kg)  10/11/20 256 lb 3.2 oz (116.2 kg)    Physical Exam Vitals and nursing note reviewed.  Constitutional:      General: She is awake. She is not in acute distress.    Appearance: She is well-developed and well-groomed. She is obese. She is not ill-appearing or toxic-appearing.  HENT:     Head: Normocephalic.     Right Ear: Hearing, ear canal and external ear normal. No drainage. A middle ear effusion is present. Tympanic membrane is not injected.     Left Ear: Hearing, ear canal and external ear normal. No drainage. A middle ear effusion is present. Tympanic membrane is not injected.     Nose: Rhinorrhea present. Rhinorrhea is clear.  Right Sinus: Maxillary sinus tenderness present. No frontal sinus tenderness.     Left Sinus: Maxillary sinus tenderness present. No frontal sinus tenderness.     Mouth/Throat:     Lips: Pink.     Mouth: Mucous membranes are moist.     Pharynx: Posterior oropharyngeal erythema (mild with cobblestoning) present. No pharyngeal swelling or oropharyngeal exudate.  Eyes:     General: Lids are normal.        Right eye: No discharge.        Left eye: No discharge.     Conjunctiva/sclera: Conjunctivae  normal.     Pupils: Pupils are equal, round, and reactive to light.  Neck:     Thyroid: No thyromegaly.     Vascular: No carotid bruit.  Cardiovascular:     Rate and Rhythm: Normal rate and regular rhythm.     Heart sounds: Normal heart sounds. No murmur heard.   No gallop.  Pulmonary:     Effort: Pulmonary effort is normal. No accessory muscle usage or respiratory distress.     Breath sounds: Normal breath sounds.  Abdominal:     General: Bowel sounds are normal.     Palpations: Abdomen is soft. There is no hepatomegaly or splenomegaly.  Musculoskeletal:     Cervical back: Normal range of motion and neck supple.     Right lower leg: No edema.     Left lower leg: No edema.  Lymphadenopathy:     Head:     Right side of head: No submental, submandibular, tonsillar, preauricular or posterior auricular adenopathy.     Left side of head: No submental, submandibular, tonsillar, preauricular or posterior auricular adenopathy.     Cervical: No cervical adenopathy.  Skin:    General: Skin is warm and dry.  Neurological:     Mental Status: She is alert and oriented to person, place, and time.  Psychiatric:        Attention and Perception: Attention normal.        Mood and Affect: Mood normal.        Speech: Speech normal.        Behavior: Behavior normal. Behavior is cooperative.        Thought Content: Thought content normal.    Results for orders placed or performed in visit on 02/15/21  Bayer DCA Hb A1c Waived  Result Value Ref Range   HB A1C (BAYER DCA - WAIVED) 6.4 (H) 4.8 - 5.6 %  Microalbumin, Urine Waived  Result Value Ref Range   Microalb, Ur Waived 150 (H) 0 - 19 mg/L   Creatinine, Urine Waived 300 10 - 300 mg/dL   Microalb/Creat Ratio 30-300 (H) <30 mg/g  Comprehensive metabolic panel  Result Value Ref Range   Glucose CANCELED mg/dL   BUN 12 8 - 27 mg/dL   Creatinine, Ser CANCELED mg/dL   BUN/Creatinine Ratio CANCELED    Sodium 144 134 - 144 mmol/L   Potassium 3.6  3.5 - 5.2 mmol/L   Chloride 105 96 - 106 mmol/L   CO2 CANCELED mmol/L   Calcium CANCELED mg/dL   Total Protein 7.0 6.0 - 8.5 g/dL   Albumin 4.4 3.8 - 4.8 g/dL   Globulin, Total 2.6 1.5 - 4.5 g/dL   Albumin/Globulin Ratio 1.7 1.2 - 2.2   Bilirubin Total 0.3 0.0 - 1.2 mg/dL   Alkaline Phosphatase 95 44 - 121 IU/L   AST 14 0 - 40 IU/L   ALT CANCELED IU/L  CBC with Differential/Platelet  Result Value Ref Range   WBC 7.8 3.4 - 10.8 x10E3/uL   RBC 4.23 3.77 - 5.28 x10E6/uL   Hemoglobin 11.6 11.1 - 15.9 g/dL   Hematocrit 35.5 34.0 - 46.6 %   MCV 84 79 - 97 fL   MCH 27.4 26.6 - 33.0 pg   MCHC 32.7 31.5 - 35.7 g/dL   RDW 13.2 11.7 - 15.4 %   Platelets 472 (H) 150 - 450 x10E3/uL   Neutrophils 69 Not Estab. %   Lymphs 21 Not Estab. %   Monocytes 7 Not Estab. %   Eos 2 Not Estab. %   Basos 1 Not Estab. %   Neutrophils Absolute 5.4 1.4 - 7.0 x10E3/uL   Lymphocytes Absolute 1.6 0.7 - 3.1 x10E3/uL   Monocytes Absolute 0.6 0.1 - 0.9 x10E3/uL   EOS (ABSOLUTE) 0.2 0.0 - 0.4 x10E3/uL   Basophils Absolute 0.0 0.0 - 0.2 x10E3/uL   Immature Granulocytes 0 Not Estab. %   Immature Grans (Abs) 0.0 0.0 - 0.1 x10E3/uL  Lipid Panel w/o Chol/HDL Ratio  Result Value Ref Range   Cholesterol, Total 128 100 - 199 mg/dL   Triglycerides CANCELED mg/dL   HDL 48 >39 mg/dL   VLDL Cholesterol Cal CANCELED mg/dL   LDL Chol Calc (NIH) CANCELED mg/dL  TSH  Result Value Ref Range   TSH 1.020 0.450 - 4.500 uIU/mL  VITAMIN D 25 Hydroxy (Vit-D Deficiency, Fractures)  Result Value Ref Range   Vit D, 25-Hydroxy 31.3 30.0 - 100.0 ng/mL  Vitamin B12  Result Value Ref Range   Vitamin B-12 >2000 (H) 232 - 1245 pg/mL  Lipid Panel w/o Chol/HDL Ratio  Result Value Ref Range   Cholesterol, Total 125 100 - 199 mg/dL   Triglycerides 99 0 - 149 mg/dL   HDL 46 >39 mg/dL   VLDL Cholesterol Cal 19 5 - 40 mg/dL   LDL Chol Calc (NIH) 60 0 - 99 mg/dL  Comprehensive metabolic panel  Result Value Ref Range   Glucose 169  (H) 70 - 99 mg/dL   BUN 12 8 - 27 mg/dL   Creatinine, Ser 0.89 0.57 - 1.00 mg/dL   eGFR 71 >59 mL/min/1.73   BUN/Creatinine Ratio 13 12 - 28   Sodium 146 (H) 134 - 144 mmol/L   Potassium 3.6 3.5 - 5.2 mmol/L   Chloride 107 (H) 96 - 106 mmol/L   CO2 18 (L) 20 - 29 mmol/L   Calcium 9.8 8.7 - 10.3 mg/dL   Total Protein 7.1 6.0 - 8.5 g/dL   Albumin 4.3 3.8 - 4.8 g/dL   Globulin, Total 2.8 1.5 - 4.5 g/dL   Albumin/Globulin Ratio 1.5 1.2 - 2.2   Bilirubin Total 0.3 0.0 - 1.2 mg/dL   Alkaline Phosphatase 93 44 - 121 IU/L   AST 13 0 - 40 IU/L   ALT 10 0 - 32 IU/L      Assessment & Plan:   Problem List Items Addressed This Visit       Other   Acute cough - Primary    Acute > one week with symptoms, cough remains with sinus pressure.  At this time will obtain flu and Covid testing in office.  Scripts for Augmentin for 7 days, Prednisone for 5 days, and Tussionex sent in.  Recommend: - Increased rest - Increasing Fluids - Acetaminophen as needed for fever/pain.  - Salt water gargling, chloraseptic spray and throat lozenges - Mucinex.  - Saline sinus flushes or a neti pot.  -  Humidifying the air Return to office for worsening or ongoing symptoms, if ongoing then will obtain imaging.      Relevant Orders   Novel Coronavirus, NAA ([facility])   Veritor Flu A/B Waived     Follow up plan: Return if symptoms worsen or fail to improve.

## 2021-03-08 LAB — NOVEL CORONAVIRUS, NAA: SARS-CoV-2, NAA: NOT DETECTED

## 2021-03-08 LAB — SARS-COV-2, NAA 2 DAY TAT

## 2021-03-08 NOTE — Progress Notes (Signed)
Contacted via MyChart   Good afternoon Jillian Pierce, Covid and flu are negative:)

## 2021-04-04 ENCOUNTER — Other Ambulatory Visit: Payer: Self-pay | Admitting: Nurse Practitioner

## 2021-04-04 NOTE — Telephone Encounter (Signed)
Discontinued 03/07/21. Not on current med profile.

## 2021-05-23 ENCOUNTER — Ambulatory Visit: Payer: Self-pay | Admitting: *Deleted

## 2021-05-23 NOTE — Telephone Encounter (Signed)
Third attempt to reach patient- no answer and VM not set up message.

## 2021-05-23 NOTE — Telephone Encounter (Signed)
Pt called in c/o this being the 3rd day of a bad cold with coughing.  Requesting medication be called in.   I attempted to return her call but "The voice mailbox has not been set up yet".

## 2021-05-23 NOTE — Telephone Encounter (Signed)
Pt scheduled tomorrow at 10 virtually. Pt states positive home covid test

## 2021-05-23 NOTE — Telephone Encounter (Signed)
Second attempt to reach patient- no answer and VM not set up message.

## 2021-05-24 ENCOUNTER — Telehealth (INDEPENDENT_AMBULATORY_CARE_PROVIDER_SITE_OTHER): Payer: Medicare HMO | Admitting: Nurse Practitioner

## 2021-05-24 ENCOUNTER — Encounter: Payer: Self-pay | Admitting: Nurse Practitioner

## 2021-05-24 ENCOUNTER — Telehealth: Payer: Self-pay | Admitting: Nurse Practitioner

## 2021-05-24 DIAGNOSIS — U071 COVID-19: Secondary | ICD-10-CM | POA: Diagnosis not present

## 2021-05-24 MED ORDER — HYDROCOD POLI-CHLORPHE POLI ER 10-8 MG/5ML PO SUER: 5.0000 mL | Freq: Two times a day (BID) | ORAL | 0 refills | Status: DC | PRN

## 2021-05-24 MED ORDER — MOLNUPIRAVIR EUA 200MG CAPSULE: 4.0000 | ORAL_CAPSULE | Freq: Two times a day (BID) | ORAL | 0 refills | Status: AC

## 2021-05-24 NOTE — Telephone Encounter (Signed)
Copied from [place] [phone]. Topic: General - Other >> May 24, 2021  1:58 PM Leward Quan A wrote: Reason for CRM: Pharmacy called in to inform [facility] Mantle  that chlorpheniramine-HYDROcodone [facility] ER) 10-8 MG/5ML only come in 70 unit dose bottles that can not be bbroken down so they need orders requesting the 70 unit for the patient. Please advise

## 2021-05-24 NOTE — Telephone Encounter (Signed)
Routing to provider to advise.  

## 2021-05-24 NOTE — Assessment & Plan Note (Signed)
Acute x 4 days of symptoms with positive home testing.  Recommend she self quarantine a total of 5 days and then 5 days of masking.  Dicussed treatment options, will send in [place] -- educated her on this and use + side effects.  She wishes to take this.  Send in Tussionex to use as needed for cough.  Recommend: - Increased rest - Increasing Fluids - Acetaminophen as needed for fever/pain.  - Salt water gargling, chloraseptic spray and throat lozenges - Mucinex.  - Humidifying the air Return for worsening or ongoing symptoms.

## 2021-05-24 NOTE — Progress Notes (Signed)
There were no vitals taken for this visit.   Subjective:    Patient ID: Jillian Pierce, female    DOB: 06/09/1955, 66 y.o.   MRN: [id number]  HPI: Jillian Pierce is a 66 y.o. female  Chief Complaint  Patient presents with   Covid Positive    Patient states she took an at-home Covid test and it was positive. Patient states she started feeling bad over the weekend and it just has gotten worse. Patient states she has headache, congestion, cough and her eye are really red and burning. Patient states she has tried Tylenol and [employer] brand cough medication. Patient states she has had some chills, but denies having any fevers.     This visit was completed via telephone due to the restrictions of the COVID-19 pandemic. All issues as above were discussed and addressed but no physical exam was performed. If it was felt that the patient should be evaluated in the office, they were directed there. The patient verbally consented to this visit. Patient was unable to complete an audio/visual visit due to Lack of equipment. Due to the catastrophic nature of the COVID-19 pandemic, this visit was done through audio contact only. Location of the patient: home Location of the provider: work Those involved with this call:  Provider: Marnee Guarneri, DNP CMA: Irena Reichmann, [place] Desk/Registration: [employer]  Time spent on call:  21 minutes on the phone discussing health concerns. 15 minutes total spent in review of patient's record and preparation of their chart.  I verified patient identity using two factors (patient name and date of birth). Patient consents verbally to being seen via telemedicine visit today.    UPPER RESPIRATORY TRACT INFECTION Started feeling bad on Monday/Tuesday (4 days ago), tested positive for Covid yesterday.  She is Covid vaccinated.  Does not recall being around anyone that was sick.   Worst symptom: cough Fever: no Cough: yes Shortness of breath: yes Wheezing:  yes Chest pain: no Chest tightness: no Chest congestion: no Nasal congestion: yes Runny nose: yes Post nasal drip: yes Sneezing: no Sore throat: yes Swollen glands: no Sinus pressure: no Headache: yes Face pain: no Toothache: no Ear pain: none Ear pressure: none Eyes red/itching:no Eye drainage/crusting: no  Vomiting: no Rash: no Fatigue: yes Sick contacts: no Strep contacts: no  Context: fluctuating Recurrent sinusitis: no Relief with OTC cold/cough medications: yes  Treatments attempted: cold/sinus and mucinex    Relevant past medical, surgical, family and social history reviewed and updated as indicated. Interim medical history since our last visit reviewed. Allergies and medications reviewed and updated.  Review of Systems  Constitutional:  Positive for fatigue and fever. Negative for activity change and appetite change.  HENT:  Positive for congestion, postnasal drip and rhinorrhea. Negative for ear discharge, ear pain, facial swelling, sinus pressure, sinus pain, sneezing, sore throat and voice change.   Eyes:  Negative for pain and visual disturbance.  Respiratory:  Positive for cough and chest tightness. Negative for shortness of breath and wheezing.   Cardiovascular:  Negative for chest pain, palpitations and leg swelling.  Gastrointestinal:  Negative for abdominal distention, abdominal pain, constipation, diarrhea, nausea and vomiting.  Endocrine: Negative.   Musculoskeletal:  Positive for myalgias.  Neurological:  Negative for dizziness, numbness and headaches.  Psychiatric/Behavioral: Negative.     Per HPI unless specifically indicated above     Objective:    There were no vitals taken for this visit.  Wt Readings from Last 3  Encounters:  02/15/21 255 lb 12.8 oz (116 kg)  11/09/20 252 lb 6.4 oz (114.5 kg)  10/11/20 256 lb 3.2 oz (116.2 kg)    Physical Exam  Unable to perform due to telephone visit only.  Results for orders placed or performed in  visit on 03/07/21  Novel Coronavirus, NAA ([facility])   Specimen: Nasopharyngeal(NP) swabs in vial transport medium  Result Value Ref Range   SARS-CoV-2, NAA Not Detected Not Detected  SARS-COV-2, NAA 2 DAY TAT  Result Value Ref Range   SARS-CoV-2, NAA 2 DAY TAT Performed   Veritor Flu A/B Waived  Result Value Ref Range   Influenza A Negative Negative   Influenza B Negative Negative      Assessment & Plan:   Problem List Items Addressed This Visit       Other   COVID-19 virus RNA test result positive at limit of detection    Acute x 4 days of symptoms with positive home testing.  Recommend she self quarantine a total of 5 days and then 5 days of masking.  Dicussed treatment options, will send in [place] -- educated her on this and use + side effects.  She wishes to take this.  Send in Tussionex to use as needed for cough.  Recommend: - Increased rest - Increasing Fluids - Acetaminophen as needed for fever/pain.  - Salt water gargling, chloraseptic spray and throat lozenges - Mucinex.  - Humidifying the air Return for worsening or ongoing symptoms.      Relevant Medications   chlorpheniramine-HYDROcodone (TUSSIONEX PENNKINETIC ER) 10-8 MG/5ML    I discussed the assessment and treatment plan with the patient. The patient was provided an opportunity to ask questions and all were answered. The patient agreed with the plan and demonstrated an understanding of the instructions.   The patient was advised to call back or seek an in-person evaluation if the symptoms worsen or if the condition fails to improve as anticipated.   I provided 21+ minutes of time during this encounter.   Follow up plan: Return if symptoms worsen or fail to improve.

## 2021-05-24 NOTE — Patient Instructions (Signed)
10 Things You Can Do to Manage Your COVID-19 Symptoms at Home ?If you have possible or confirmed COVID-19 ?Stay home except to get medical care. ?Monitor your symptoms carefully. If your symptoms get worse, call your healthcare provider immediately. ?Get rest and stay hydrated. ?If you have a medical appointment, call the healthcare provider ahead of time and tell them that you have or may have COVID-19. ?For medical emergencies, call 911 and notify the dispatch personnel that you have or may have COVID-19. ?Cover your cough and sneezes with a tissue or use the inside of your elbow. ?Wash your hands often with soap and water for at least 20 seconds or clean your hands with an alcohol-based hand sanitizer that contains at least 60% alcohol. ?As much as possible, stay in a specific room and away from other people in your home. Also, you should use a separate bathroom, if available. If you need to be around other people in or outside of the home, wear a mask. ?Avoid sharing personal items with other people in your household, like dishes, towels, and bedding. ?Clean all surfaces that are touched often, like counters, tabletops, and doorknobs. Use household cleaning sprays or wipes according to the label instructions. ?cdc.gov/coronavirus ?11/05/2019 ?This information is not intended to replace advice given to you by your health care provider. Make sure you discuss any questions you have with your health care provider. ?Document Revised: 12/29/2020 Document Reviewed: 12/29/2020 ?Elsevier Patient Education ? 2022 [employer] ? ?

## 2021-06-28 ENCOUNTER — Ambulatory Visit (INDEPENDENT_AMBULATORY_CARE_PROVIDER_SITE_OTHER): Payer: Medicare HMO | Admitting: *Deleted

## 2021-06-28 DIAGNOSIS — Z0000 Encounter for general adult medical examination without abnormal findings: Secondary | ICD-10-CM | POA: Diagnosis not present

## 2021-06-28 NOTE — Progress Notes (Signed)
Subjective:   Jillian Pierce is a 66 y.o. female who presents for Medicare Annual (Subsequent) preventive examination.  I connected with  Orson Aloe on 06/28/21 by a telephone enabled telemedicine application and verified that I am speaking with the correct person using two identifiers.   I discussed the limitations of evaluation and management by telemedicine. The patient expressed understanding and agreed to proceed.  Patient location: home  Provider location: Tele-Health visit not in office    Review of Systems     Cardiac Risk Factors include: advanced age (>104mn, >>82women);diabetes mellitus;sedentary lifestyle;obesity (BMI >30kg/m2);family history of premature cardiovascular disease;hypertension     Objective:    Today's Vitals   There is no height or weight on file to calculate BMI.  Advanced Directives 06/28/2021 06/26/2020 06/17/2019 11/03/2018 07/15/2018 11/21/2014  Does Patient Have a Medical Advance Directive? No No No No No No  Would patient like information on creating a medical advance directive? No - Patient declined - - No - Patient declined - Yes - Educational materials given    Current Medications (verified) Outpatient Encounter Medications as of 06/28/2021  Medication Sig   albuterol (VENTOLIN HFA) 108 (90 Base) MCG/ACT inhaler Inhale 2 puffs into the lungs every 6 (six) hours as needed for wheezing or shortness of breath.   amLODipine (NORVASC) 10 MG tablet Take 1 tablet (10 mg total) by mouth daily.   Ascorbic Acid (VITAMIN C PO) Take by mouth daily.   aspirin EC 81 MG tablet Take 81 mg by mouth daily.   atorvastatin (LIPITOR) 10 MG tablet Take 1 tablet (10 mg total) by mouth daily.   chlorpheniramine-HYDROcodone (TUSSIONEX PENNKINETIC ER) 10-8 MG/5ML Take 5 mLs by mouth every 12 (twelve) hours as needed for cough.   cloNIDine (CATAPRES) 0.3 MG tablet Take 1 tablet (0.3 mg total) by mouth 2 (two) times daily.   Cyanocobalamin (VITAMIN B 12 PO) Take by mouth  daily.   fluticasone (FLONASE) 50 MCG/ACT nasal spray Place 2 sprays into both nostrils daily.   furosemide (LASIX) 20 MG tablet Take 1 tablet (20 mg total) by mouth daily.   gabapentin (NEURONTIN) 600 MG tablet Take 0.5 tablets (300 mg total) by mouth at bedtime.   lisinopril (ZESTRIL) 10 MG tablet Take 1 tablet (10 mg total) by mouth daily.   meloxicam (MOBIC) 15 MG tablet Take 15 mg by mouth daily. As needed   metFORMIN (GLUCOPHAGE) 500 MG tablet TAKE ONE-HALF TABLET BY MOUTH TWICE DAILY   oxyCODONE-acetaminophen (PERCOCET/ROXICET) 5-325 MG tablet Take 1 tablet by mouth every 4 (four) hours as needed for severe pain.   VITAMIN D PO Take by mouth daily.   No facility-administered encounter medications on file as of 06/28/2021.    Allergies (verified) Patient has no known allergies.   History: Past Medical History:  Diagnosis Date   [place]    Hypertension    Left knee pain 05/17/2014   Morbid [place] ([place] 02/15/2016   Past Surgical History:  Procedure Laterality Date   ABDOMINAL HYSTERECTOMY     complete   ACHILLES TENDON SURGERY Left    COLONOSCOPY WITH PROPOFOL N/A 11/03/2018   Procedure: COLONOSCOPY WITH PROPOFOL;  Surgeon: VLin Landsman MD;  [place]  Service: Gastroenterology;  Laterality: N/A;   HERNIA REPAIR  11/2014   INSERTION OF MESH N/A 11/23/2014   Procedure: INSERTION OF MESH;  Surgeon: MSherri Rad MD;  Location: [facility];  Service: General;  Laterality: N/A;  JOINT REPLACEMENT Left 12/18/2018   VENTRAL HERNIA REPAIR N/A 11/23/2014   Procedure: HERNIA REPAIR VENTRAL ADULT;  Surgeon: Sherri Rad, MD;  Location: [facility];  Service: General;  Laterality: N/A;   Family History  Problem Relation Age of Onset   Hypertension Mother    Diabetes Mother    Kidney disease Mother    Hypertension Father    Diabetes Father    Heart failure Father    Heart disease Sister    Heart disease Brother    Hypertension Son     Breast cancer Maternal Aunt    Social History   Socioeconomic History   Marital status: Single    Spouse name: Not on file   Number of children: Not on file   Years of education: Not on file   Highest education level: Not on file  Occupational History   Occupation: disability  Tobacco Use   Smoking status: Never   Smokeless tobacco: Never  Vaping Use   Vaping Use: Never used  Substance and Sexual Activity   Alcohol use: No   Drug use: No   Sexual activity: Not Currently  Other Topics Concern   Not on file  Social History Narrative   Not on file   Social Determinants of Health   Financial Resource Strain: Low Risk    Difficulty of Paying Living Expenses: Not hard at all  Food Insecurity: No Food Insecurity   Worried About Charity fundraiser in the Last Year: Never true   [place] in the Last Year: Never true  Transportation Needs: No Transportation Needs   Lack of Transportation (Medical): No   Lack of Transportation (Non-Medical): No  Physical Activity: Inactive   Days of Exercise per Week: 0 days   Minutes of Exercise per Session: 0 min  Stress: No Stress Concern Present   Feeling of Stress : Not at all  Social Connections: Unknown   Frequency of Communication with Friends and Family: More than three times a week   Frequency of Social Gatherings with Friends and Family: Once a week   Attends Religious Services: Never   Marine scientist or Organizations: No   Attends Music therapist: Never   Marital Status: Not on file    Tobacco Counseling Counseling given: Not Answered   Clinical Intake:  Pre-visit preparation completed: Yes  Pain : No/denies pain     Nutritional Risks: None Diabetes: Yes CBG done?: No Did pt. bring in CBG monitor from home?: No  How often do you need to have someone help you when you read instructions, pamphlets, or other written materials from your doctor or pharmacy?: 1 - Never  Diabetic?   Yes  Nutrition Risk Assessment:  Has the patient had any N/V/D within the last 2 months?  No  Does the patient have any non-healing wounds?  No  Has the patient had any unintentional weight loss or weight gain?  No   Diabetes:  Is the patient diabetic?  Yes  If diabetic, was a CBG obtained today?  No  Did the patient bring in their glucometer from home?  No  How often do you monitor your CBG's? never.   Financial Strains and Diabetes Management:  Are you having any financial strains with the device, your supplies or your medication? No .  Does the patient want to be seen by Chronic Care Management for management of their diabetes?  No  Would the patient like to be referred  to a Nutritionist or for Diabetic Management?  No   Diabetic Exams:  Diabetic Eye Exam: . Overdue for diabetic eye exam. Pt has been advised about the importance in completing this exam. A referral has been placed today.   Patient is going to call to schedule [facility]  Diabetic Foot Exam: Pt has been advised about the importance in completing this exam.   Interpreter Needed?: No  Information entered by :: Leroy Kennedy  LPN   Activities of Daily Living In your present state of health, do you have any difficulty performing the following activities: 06/28/2021 02/15/2021  Hearing? N N  Vision? N N  Difficulty concentrating or making decisions? N N  Walking or climbing stairs? N N  Dressing or bathing? N N  Doing errands, shopping? N N  Preparing Food and eating ? N -  Using the Toilet? N -  In the past six months, have you accidently leaked urine? N -  Do you have problems with loss of bowel control? N -  Managing your Medications? N -  Managing your Finances? N -  Housekeeping or managing your Housekeeping? N -  Some recent data might be hidden    Patient Care Team: Venita Lick, NP as PCP - General (Nurse Practitioner)  Indicate any recent Medical Services you may have received from  other than [facility] providers in the past year (date may be approximate).     Assessment:   This is a routine wellness examination for Meron.  Hearing/Vision screen Hearing Screening - Comments:: No trouble hearing Vision Screening - Comments:: Not up to date  eye  Dietary issues and exercise activities discussed: Current Exercise Habits: The patient does not participate in regular exercise at present   Goals Addressed             This Visit's Progress    Weight (lb) < 200 lb (90.7 kg)         Depression Screen PHQ 2/9 Scores 06/28/2021 02/15/2021 06/26/2020 06/17/2019 02/12/2019 08/25/2017  PHQ - 2 Score 0 0 0 0 0 0  PHQ- 9 Score - 0 - - - -    Fall Risk Fall Risk  06/28/2021 02/15/2021 02/15/2021 06/26/2020 04/10/2020  Falls in the past year? 0 0 0 0 0  Number falls in past yr: 0 0 0 - 0  Injury with Fall? 0 0 0 - 0  Risk for fall due to : - No Fall Risks No Fall Risks Medication side effect -  Follow up Falls evaluation completed;Falls prevention discussed Falls evaluation completed Falls prevention discussed Falls evaluation completed;Education provided;Falls prevention discussed -    FALL RISK PREVENTION PERTAINING TO THE HOME:  Any stairs in or around the home? Yes  If so, are there any without handrails? No  Home free of loose throw rugs in walkways, pet beds, electrical cords, etc? Yes  Adequate lighting in your home to reduce risk of falls? Yes   ASSISTIVE DEVICES UTILIZED TO PREVENT FALLS:  Life alert? No  Use of a cane, walker or w/c? Yes  Grab bars in the bathroom? No  Shower chair or bench in shower? No  Elevated toilet seat or a handicapped toilet? No   TIMED UP AND GO:  Was the test performed? No .    Cognitive Function:  Normal cognitive status assessed by direct observation by this Nurse Health Advisor. No abnormalities found.       6CIT Screen 06/26/2020  What Year? 0 points  What month? 0 points  What time? 0 points  Count back from 20 0  points  Months in reverse 4 points  Repeat phrase 10 points  Total Score 14    Immunizations Immunization History  Administered Date(s) Administered   Fluad Quad(high Dose 65+) 12/28/2019, 02/15/2021   Influenza Inj Mdck Quad Pf 02/15/2016   Influenza,inj,Quad PF,6+ Mos 01/13/2018, 02/12/2019   Influenza-Unspecified 02/25/2013, 02/21/2015   PFIZER(Purple Top)SARS-COV-2 Vaccination 07/13/2019, 08/03/2019, 06/14/2020   Pneumococcal Conjugate-13 12/28/2019   Pneumococcal Polysaccharide-23 01/13/2018   Tdap 10/11/2020    TDAP status: Up to date  Flu Vaccine status: Up to date  Pneumococcal vaccine status: Up to date  Covid-19 vaccine status: Information provided on how to obtain vaccines.   Qualifies for Shingles Vaccine? Yes   Zostavax completed No   Shingrix Completed?: No.    Education has been provided regarding the importance of this vaccine. Patient has been advised to call insurance company to determine out of pocket expense if they have not yet received this vaccine. Advised may also receive vaccine at local pharmacy or Health Dept. Verbalized acceptance and understanding.  Screening Tests Health Maintenance  Topic Date Due   OPHTHALMOLOGY EXAM  07/25/2020   COVID-19 Vaccine (4 - Booster for Pfizer series) 08/09/2020   Zoster Vaccines- Shingrix (1 of 2) 09/28/2021 (Originally 05/23/2004)   HEMOGLOBIN A1C  08/16/2021   FOOT EXAM  02/15/2022   MAMMOGRAM  01/04/2023   Pneumonia Vaccine 73+ Years old (3) 01/14/2023   COLONOSCOPY (Pts 45-72yr Insurance coverage will need to be confirmed)  11/02/2025   TETANUS/TDAP  10/12/2030   INFLUENZA VACCINE  Completed   DEXA SCAN  Completed   Hepatitis C Screening  Completed   HPV VACCINES  Aged Out    Health Maintenance  Health Maintenance Due  Topic Date Due   OPHTHALMOLOGY EXAM  07/25/2020   COVID-19 Vaccine (4 - Booster for PTrippseries) 08/09/2020    Colorectal cancer screening: Type of screening: Colonoscopy.  Completed 2020. Repeat every 7 years  Mammogram status: Completed  . Repeat every year  Bone Density status: Completed 2022. Results reflect: Bone density results: OSTEOPENIA. Repeat every 5 years.  Lung Cancer Screening: (Low Dose CT Chest recommended if Age 689-80years, 30 pack-year currently smoking OR have quit w/in 15years.) does not qualify.   Lung Cancer Screening Referral:   Additional Screening:  Hepatitis C Screening: does not qualify; Completed 2021  Vision Screening: Recommended annual ophthalmology exams for early detection of glaucoma and other disorders of the eye. Is the patient up to date with their annual eye exam?  No  Who is the provider or what is the name of the office in which the patient attends annual eye exams? [facility] pt is not established with a provider, would they like to be referred to a provider to establish care? No .   Dental Screening: Recommended annual dental exams for proper oral hygiene  Community Resource Referral / Chronic Care Management: CRR required this visit?  No   CCM required this visit?  No      Plan:     I have personally reviewed and noted the following in the patients chart:   Medical and social history Use of alcohol, tobacco or illicit drugs  Current medications and supplements including opioid prescriptions.  Functional ability and status Nutritional status Physical activity Advanced directives List of other physicians Hospitalizations, surgeries, and ER visits in previous 12 months Vitals Screenings to include cognitive, depression,  and falls Referrals and appointments  In addition, I have reviewed and discussed with patient certain preventive protocols, quality metrics, and best practice recommendations. A written personalized care plan for preventive services as well as general preventive health recommendations were provided to patient.     Leroy Kennedy, LPN   04/30/4172   Nurse Notes: patient  asked about food stamps offered to put a referral in she declined at this time.

## 2021-06-28 NOTE — Patient Instructions (Signed)
Jillian Pierce , Thank you for taking time to come for your Medicare Wellness Visit. I appreciate your ongoing commitment to your health goals. Please review the following plan we discussed and let me know if I can assist you in the future.   Screening recommendations/referrals: Colonoscopy: up to date Mammogram: up to date Bone Density: up to date Recommended yearly ophthalmology/optometry visit for glaucoma screening and checkup Recommended yearly dental visit for hygiene and checkup  Vaccinations: Influenza vaccine: up to date Pneumococcal vaccine: up to date Tdap vaccine: up to date Shingles vaccine: Education provided    Advanced directives: Education provided  Conditions/risks identified:   Next appointment: 08-16-2021 @ 9:40  [place] 65 Years and Older, Female Preventive care refers to lifestyle choices and visits with your health care provider that can promote health and wellness. What does preventive care include? A yearly physical exam. This is also called an annual well check. Dental exams once or twice a year. Routine eye exams. Ask your health care provider how often you should have your eyes checked. Personal lifestyle choices, including: Daily care of your teeth and gums. Regular physical activity. Eating a healthy diet. Avoiding tobacco and drug use. Limiting alcohol use. Practicing safe sex. Taking low-dose aspirin every day. Taking vitamin and mineral supplements as recommended by your health care provider. What happens during an annual well check? The services and screenings done by your health care provider during your annual well check will depend on your age, overall health, lifestyle risk factors, and family history of disease. Counseling  Your health care provider may ask you questions about your: Alcohol use. Tobacco use. Drug use. Emotional well-being. Home and relationship well-being. Sexual activity. Eating habits. History of  falls. Memory and ability to understand (cognition). Work and work Statistician. Reproductive health. Screening  You may have the following tests or measurements: Height, weight, and BMI. Blood pressure. Lipid and cholesterol levels. These may be checked every 5 years, or more frequently if you are over 71 years old. Skin check. Lung cancer screening. You may have this screening every year starting at age 75 if you have a 30-pack-year history of smoking and currently smoke or have quit within the past 15 years. Fecal occult blood test (FOBT) of the stool. You may have this test every year starting at age 37. Flexible sigmoidoscopy or colonoscopy. You may have a sigmoidoscopy every 5 years or a colonoscopy every 10 years starting at age 62. Hepatitis C blood test. Hepatitis B blood test. Sexually transmitted disease (STD) testing. Diabetes screening. This is done by checking your blood sugar (glucose) after you have not eaten for a while (fasting). You may have this done every 1-3 years. Bone density scan. This is done to screen for osteoporosis. You may have this done starting at age 32. Mammogram. This may be done every 1-2 years. Talk to your health care provider about how often you should have regular mammograms. Talk with your health care provider about your test results, treatment options, and if necessary, the need for more tests. Vaccines  Your health care provider may recommend certain vaccines, such as: Influenza vaccine. This is recommended every year. Tetanus, diphtheria, and acellular pertussis (Tdap, Td) vaccine. You may need a Td booster every 10 years. Zoster vaccine. You may need this after age 6. Pneumococcal 13-valent conjugate (PCV13) vaccine. One dose is recommended after age 46. Pneumococcal polysaccharide (PPSV23) vaccine. One dose is recommended after age 5. Talk to your health care  provider about which screenings and vaccines you need and how often you need  them. This information is not intended to replace advice given to you by your health care provider. Make sure you discuss any questions you have with your health care provider. Document Released: 05/05/2015 Document Revised: 12/27/2015 Document Reviewed: 02/07/2015 Elsevier Interactive Patient Education  2017 [place] Prevention in the Home Falls can cause injuries. They can happen to people of all ages. There are many things you can do to make your home safe and to help prevent falls. What can I do on the outside of my home? Regularly fix the edges of walkways and driveways and fix any cracks. Remove anything that might make you trip as you walk through a door, such as a raised step or threshold. Trim any bushes or trees on the path to your home. Use bright outdoor lighting. Clear any walking paths of anything that might make someone trip, such as rocks or tools. Regularly check to see if handrails are loose or broken. Make sure that both sides of any steps have handrails. Any raised decks and porches should have guardrails on the edges. Have any leaves, snow, or ice cleared regularly. Use sand or salt on walking paths during winter. Clean up any spills in your garage right away. This includes oil or grease spills. What can I do in the bathroom? Use night lights. Install grab bars by the toilet and in the tub and shower. Do not use towel bars as grab bars. Use non-skid mats or decals in the tub or shower. If you need to sit down in the shower, use a plastic, non-slip stool. Keep the floor dry. Clean up any water that spills on the floor as soon as it happens. Remove soap buildup in the tub or shower regularly. Attach bath mats securely with double-sided non-slip rug tape. Do not have throw rugs and other things on the floor that can make you trip. What can I do in the bedroom? Use night lights. Make sure that you have a light by your bed that is easy to reach. Do not use  any sheets or blankets that are too big for your bed. They should not hang down onto the floor. Have a firm chair that has side arms. You can use this for support while you get dressed. Do not have throw rugs and other things on the floor that can make you trip. What can I do in the kitchen? Clean up any spills right away. Avoid walking on wet floors. Keep items that you use a lot in easy-to-reach places. If you need to reach something above you, use a strong step stool that has a grab bar. Keep electrical cords out of the way. Do not use floor polish or wax that makes floors slippery. If you must use wax, use non-skid floor wax. Do not have throw rugs and other things on the floor that can make you trip. What can I do with my stairs? Do not leave any items on the stairs. Make sure that there are handrails on both sides of the stairs and use them. Fix handrails that are broken or loose. Make sure that handrails are as long as the stairways. Check any carpeting to make sure that it is firmly attached to the stairs. Fix any carpet that is loose or worn. Avoid having throw rugs at the top or bottom of the stairs. If you do have throw rugs, attach them to the  floor with carpet tape. Make sure that you have a light switch at the top of the stairs and the bottom of the stairs. If you do not have them, ask someone to add them for you. What else can I do to help prevent falls? Wear shoes that: Do not have high heels. Have rubber bottoms. Are comfortable and fit you well. Are closed at the toe. Do not wear sandals. If you use a stepladder: Make sure that it is fully opened. Do not climb a closed stepladder. Make sure that both sides of the stepladder are locked into place. Ask someone to hold it for you, if possible. Clearly mark and make sure that you can see: Any grab bars or handrails. First and last steps. Where the edge of each step is. Use tools that help you move around (mobility aids)  if they are needed. These include: Canes. Walkers. Scooters. Crutches. Turn on the lights when you go into a dark area. Replace any light bulbs as soon as they burn out. Set up your furniture so you have a clear path. Avoid moving your furniture around. If any of your floors are uneven, fix them. If there are any pets around you, be aware of where they are. Review your medicines with your doctor. Some medicines can make you feel dizzy. This can increase your chance of falling. Ask your doctor what other things that you can do to help prevent falls. This information is not intended to replace advice given to you by your health care provider. Make sure you discuss any questions you have with your health care provider. Document Released: 02/02/2009 Document Revised: 09/14/2015 Document Reviewed: 05/13/2014 Elsevier Interactive Patient Education  2017 [employer].

## 2021-06-29 ENCOUNTER — Ambulatory Visit: Payer: Medicare HMO

## 2021-08-12 NOTE — Patient Instructions (Signed)

## 2021-08-16 ENCOUNTER — Ambulatory Visit (INDEPENDENT_AMBULATORY_CARE_PROVIDER_SITE_OTHER): Payer: Medicare HMO | Admitting: Nurse Practitioner

## 2021-08-16 ENCOUNTER — Encounter: Payer: Self-pay | Admitting: Nurse Practitioner

## 2021-08-16 VITALS — BP 130/82 | HR 81 | Temp 98.4°F | Resp 18 | Ht 60.0 in | Wt 251.0 lb

## 2021-08-16 DIAGNOSIS — Z23 Encounter for immunization: Secondary | ICD-10-CM

## 2021-08-16 DIAGNOSIS — E1140 Type 2 diabetes mellitus with diabetic neuropathy, unspecified: Secondary | ICD-10-CM | POA: Diagnosis not present

## 2021-08-16 DIAGNOSIS — R809 Proteinuria, unspecified: Secondary | ICD-10-CM

## 2021-08-16 DIAGNOSIS — R600 Localized edema: Secondary | ICD-10-CM | POA: Diagnosis not present

## 2021-08-16 DIAGNOSIS — E1169 Type 2 diabetes mellitus with other specified complication: Secondary | ICD-10-CM | POA: Diagnosis not present

## 2021-08-16 DIAGNOSIS — E1129 Type 2 diabetes mellitus with other diabetic kidney complication: Secondary | ICD-10-CM | POA: Diagnosis not present

## 2021-08-16 DIAGNOSIS — E1159 Type 2 diabetes mellitus with other circulatory complications: Secondary | ICD-10-CM

## 2021-08-16 DIAGNOSIS — I152 Hypertension secondary to endocrine disorders: Secondary | ICD-10-CM | POA: Diagnosis not present

## 2021-08-16 DIAGNOSIS — R011 Cardiac murmur, unspecified: Secondary | ICD-10-CM

## 2021-08-16 DIAGNOSIS — E785 Hyperlipidemia, unspecified: Secondary | ICD-10-CM

## 2021-08-16 LAB — BAYER DCA HB A1C WAIVED: HB A1C (BAYER DCA - WAIVED): 6.1 % — ABNORMAL HIGH (ref 4.8–5.6)

## 2021-08-16 LAB — MICROALBUMIN, URINE WAIVED
Creatinine, Urine Waived: 100 mg/dL (ref 10–300)
Microalb, Ur Waived: 80 mg/L — ABNORMAL HIGH (ref 0–19)

## 2021-08-16 MED ORDER — SHINGRIX 50 MCG/0.5ML IM SUSR: 0.5000 mL | Freq: Once | INTRAMUSCULAR | 0 refills | Status: AC

## 2021-08-16 MED ORDER — SHINGRIX 50 MCG/0.5ML IM SUSR
0.5000 mL | Freq: Once | INTRAMUSCULAR | 0 refills | Status: AC
Start: 2021-10-11 — End: 2021-10-11

## 2021-08-16 NOTE — Assessment & Plan Note (Signed)
With aortic sclerosis and moderate LVH past echo, repeat echo due to some increase in edema (she is also on Amlodipine and Gabapentin, which we discussed) + ongoing baseline SOB.  Consider cardiology referral if any worsening heart function. ?

## 2021-08-16 NOTE — Assessment & Plan Note (Addendum)
BMI 49.02, with T2DM and HTN/HLD.  Recommended eating smaller high protein, low fat meals more frequently and exercising 30 mins a day 5 times a week with a goal of 10-15lb weight loss in the next 3 months. Patient voiced their understanding and motivation to adhere to these recommendations. ? ?

## 2021-08-16 NOTE — Assessment & Plan Note (Signed)
Chronic, ongoing.  Continue current medication regimen and adjust as needed.  Lipid panel today.  Return in 6 months. 

## 2021-08-16 NOTE — Assessment & Plan Note (Signed)
Chronic, ongoing with recent A1c levels below goal, today 6.1%.  Urine ALB 80 today (April 2023), continue Lisinopril for kidney protection.  Continue current medication regimen and adjust as needed, although discussed with her possibly stopping Metformin and changing to a GLP1 to assist with her weight loss goals, she is not interested in this at this time. Continue daily B12 supplement for low levels in past.  Continue Gabapentin at night.  Return in 6 months. ?

## 2021-08-16 NOTE — Assessment & Plan Note (Signed)
Ongoing with some increase, she is on Amlodipine and Gabapentin, discussed with her.  At this time repeat echo to check on heart function.  Recommend compression hose on during day and off at night.   ?

## 2021-08-16 NOTE — Progress Notes (Signed)
? ?BP 130/82 (BP Location: Left Arm, Patient Position: Sitting, Cuff Size: Large)   Pulse 81   Temp 98.4 ?F (36.9 ?C) (Oral)   Resp 18   Ht 5' (1.524 m)   Wt 251 lb (113.9 kg)   SpO2 95%   BMI 49.02 kg/m?   ? ?Subjective:  ? ? Patient ID: Jillian Pierce, female    DOB: September 25, 1955, 66 y.o.   MRN: [id number] ? ?HPI: ?Jillian Pierce is a 66 y.o. female ? ?Chief Complaint  ?Patient presents with  ? Diabetes  ?  Patient declines having a recent Diabetic Eye Exam.   ? Hyperlipidemia  ? Hypertension  ? Vitamin D  ? ?DIABETES ?Last A1c 6.4% October.  Continues on Metformin 250 MG BID.  Takes Gabapentin 600 MG at bedtime for ongoing back & hip pain.  Continues on Vitamin D and B12 at home for history of low levels - recent stable ?Hypoglycemic episodes:no ?Polydipsia/polyuria: no ?Visual disturbance: no ?Chest pain: no ?Paresthesias: no ?Glucose Monitoring: no ?            Accucheck frequency: Not Checking ?            Fasting glucose: ?            Post prandial: ?            Evening: ?            Before meals: ?Taking Insulin?: no ?            Long acting insulin: ?            Short acting insulin: ?Blood Pressure Monitoring: not checking ?Retinal Examination: Not Up to Date -- [facility] ?Foot Exam: Up to Date ?Pneumovax: Up to Date ?Influenza: Up to Date ?Aspirin: yes  ?  ?HYPERTENSION / HYPERLIPIDEMIA ?Continues on Lipitor and Norvasc, Lisinopril, Clonidine, and Lasix. ?  ?Recent echo on 02/28/20 noted EF 60-65% and moderate LVH with mild aortic valve sclerosis.  At baseline she sleeps in recliner, lying down, but denies orthopnea. ?Satisfied with current treatment? yes ?Duration of hypertension: chronic ?BP monitoring frequency: not checking ?BP range:  ?BP medication side effects: no ?Duration of hyperlipidemia: chronic ?Cholesterol medication side effects: no ?Cholesterol supplements: none ?Medication compliance: good compliance ?Aspirin: yes ?Recent stressors: no ?Recurrent headaches: no ?Visual changes:  no ?Palpitations: no ?Dyspnea: at times with long distance only -- uses Albuterol, which helps a whole lot ?Chest pain: no ?Lower extremity edema: at baseline -- a little more to left side recently to ankle and foot -- goes down a night, when on feet a lot this returns ?Dizzy/lightheaded: no ? ? ?  08/16/2021  ? 10:04 AM 06/28/2021  ? 11:32 AM 02/15/2021  ?  9:51 AM 06/26/2020  ? 11:17 AM 06/17/2019  ? 10:56 AM  ?Depression screen PHQ 2/9  ?Decreased Interest 0 0 0 0 0  ?Down, Depressed, Hopeless 0 0 0 0 0  ?PHQ - 2 Score 0 0 0 0 0  ?Altered sleeping 0  0    ?Tired, decreased energy 1  0    ?Change in appetite 0  0    ?Feeling bad or failure about yourself  0  0    ?Trouble concentrating 0  0    ?Moving slowly or fidgety/restless 0  0    ?Suicidal thoughts 0  0    ?PHQ-9 Score 1  0    ?Difficult doing work/chores Not difficult at all      ?  ? ?  08/16/2021  ? 10:04 AM  ?GAD 7 : Generalized Anxiety Score  ?Nervous, Anxious, on Edge 0  ?Control/stop worrying 0  ?Worry too much - different things 0  ?Trouble relaxing 0  ?Restless 0  ?Easily annoyed or irritable 0  ?Afraid - awful might happen 0  ?Total GAD 7 Score 0  ? ?Relevant past medical, surgical, family and social history reviewed and updated as indicated. Interim medical history since our last visit reviewed. ?Allergies and medications reviewed and updated. ? ?Review of Systems  ?Constitutional:  Negative for activity change, appetite change, diaphoresis, fatigue and fever.  ?Respiratory:  Positive for shortness of breath (at baseline, no worsening). Negative for cough, chest tightness and wheezing.   ?Cardiovascular:  Positive for leg swelling (at baseline). Negative for chest pain and palpitations.  ?Gastrointestinal: Negative.   ?Endocrine: Negative for cold intolerance, heat intolerance, polydipsia, polyphagia and polyuria.  ?Neurological: Negative.   ?Psychiatric/Behavioral: Negative.    ? ?Per HPI unless specifically indicated above ? ?   ?Objective:  ?  ?BP  130/82 (BP Location: Left Arm, Patient Position: Sitting, Cuff Size: Large)   Pulse 81   Temp 98.4 ?F (36.9 ?C) (Oral)   Resp 18   Ht 5' (1.524 m)   Wt 251 lb (113.9 kg)   SpO2 95%   BMI 49.02 kg/m?   ?Wt Readings from Last 3 Encounters:  ?08/16/21 251 lb (113.9 kg)  ?02/15/21 255 lb 12.8 oz (116 kg)  ?11/09/20 252 lb 6.4 oz (114.5 kg)  ?  ?Physical Exam ?Vitals and nursing note reviewed.  ?Constitutional:   ?   General: She is awake. She is not in acute distress. ?   Appearance: She is well-developed and well-groomed. She is morbidly obese. She is not ill-appearing.  ?HENT:  ?   Head: Normocephalic.  ?   Right Ear: Hearing normal.  ?   Left Ear: Hearing normal.  ?Eyes:  ?   General: Lids are normal.     ?   Right eye: No discharge.     ?   Left eye: No discharge.  ?   Conjunctiva/sclera: Conjunctivae normal.  ?   Pupils: Pupils are equal, round, and reactive to light.  ?Neck:  ?   Thyroid: No thyromegaly.  ?   Vascular: No carotid bruit.  ?Cardiovascular:  ?   Rate and Rhythm: Normal rate and regular rhythm.  ?   Heart sounds: Murmur heard.  ?Systolic murmur is present with a grade of 2/6.  ?  No gallop.  ?Pulmonary:  ?   Effort: Pulmonary effort is normal. No accessory muscle usage or respiratory distress.  ?   Breath sounds: Normal breath sounds.  ?Abdominal:  ?   General: Bowel sounds are normal.  ?   Palpations: Abdomen is soft.  ?Musculoskeletal:  ?   Cervical back: Normal range of motion and neck supple.  ?   Right lower leg: 2+ Pitting Edema present.  ?   Left lower leg: 2+ Pitting Edema present.  ?Skin: ?   General: Skin is warm and dry.  ?Neurological:  ?   Mental Status: She is alert and oriented to person, place, and time.  ?Psychiatric:     ?   Attention and Perception: Attention normal.     ?   Mood and Affect: Mood normal.     ?   Speech: Speech normal.     ?   Behavior: Behavior normal. Behavior is cooperative.     ?  Thought Content: Thought content normal.  ? ? ?Results for orders placed or  performed in visit on 08/16/21  ?Bayer DCA Hb A1c Waived  ?Result Value Ref Range  ? HB A1C (BAYER DCA - WAIVED) 6.1 (H) 4.8 - 5.6 %  ?Microalbumin, Urine Waived  ?Result Value Ref Range  ? Microalb, Ur Waived 80 (H) 0 - 19 mg/L  ? Creatinine, Urine Waived 100 10 - 300 mg/dL  ? Microalb/Creat Ratio 30-300 (H) <30 mg/g  ? ?   ?Assessment & Plan:  ? ?Problem List Items Addressed This Visit   ? ?  ? Cardiovascular and Mediastinum  ? Hypertension associated with diabetes ([place])  ?  Chronic, ongoing.  BP at goal in office today on recheck.  Continue current medication regimen and adjust as needed.  Recommend checking BP at home three mornings a week and documenting for provider + focus on DASH diet.  LABS: BMP and urine ALB today.  Return in 6 months. ? ?  ?  ? Relevant Orders  ? Basic metabolic panel  ? Bayer DCA Hb A1c Waived (Completed)  ? Microalbumin, Urine Waived (Completed)  ? ECHOCARDIOGRAM COMPLETE  ?  ? Endocrine  ? 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  Chronic, ongoing with recent A1c levels below goal, today 6.1%.  Urine ALB 80 today (April 2023), continue Lisinopril for kidney protection.  Continue current medication regimen and adjust as needed, although discussed with her possibly stopping Metformin and changing to a GLP1 to assist with her weight loss goals, she is not interested in this at this time. Continue daily B12 supplement for low levels in past.  Continue Gabapentin at night.  Return in 6 months. ? ?  ?  ? Relevant Orders  ? Basic metabolic panel  ? Bayer DCA Hb A1c Waived (Completed)  ? Microalbumin, Urine Waived (Completed)  ? Hyperlipidemia associated with type 2 diabetes mellitus ([place])  ?  Chronic, ongoing.  Continue current medication regimen and adjust as needed.  Lipid panel today. Return in 6 months. ? ? ? ?  ?  ? Relevant Orders  ? Bayer DCA Hb A1c Waived (Completed)  ? Lipid Panel w/o Chol/HDL Ratio  ? Type 2 diabetes mellitus with diabetic neuropathy, unspecified ([place]) - Primary   ?  Chronic, ongoing with recent A1c levels below goal, today 6.1%.  Urine ALB 80 today (April 2023), continue Lisinopril for kidney protection.  Continue current medication regimen and adjust as needed, although disc

## 2021-08-16 NOTE — Assessment & Plan Note (Signed)
Chronic, ongoing.  BP at goal in office today on recheck.  Continue current medication regimen and adjust as needed.  Recommend checking BP at home three mornings a week and documenting for provider + focus on DASH diet.  LABS: BMP and urine ALB today.  Return in 6 months. ?

## 2021-08-17 LAB — BASIC METABOLIC PANEL
BUN/Creatinine Ratio: 18 (ref 12–28)
BUN: 15 mg/dL (ref 8–27)
CO2: 21 mmol/L (ref 20–29)
Calcium: 9.8 mg/dL (ref 8.7–10.3)
Chloride: 104 mmol/L (ref 96–106)
Creatinine, Ser: 0.85 mg/dL (ref 0.57–1.00)
Glucose: 118 mg/dL — ABNORMAL HIGH (ref 70–99)
Potassium: 4.2 mmol/L (ref 3.5–5.2)
Sodium: 142 mmol/L (ref 134–144)
eGFR: 75 mL/min/{1.73_m2} (ref >59)

## 2021-08-17 LAB — LIPID PANEL W/O CHOL/HDL RATIO
Cholesterol, Total: 119 mg/dL (ref 100–199)
HDL: 48 mg/dL (ref >39)
LDL Chol Calc (NIH): 56 mg/dL (ref 0–99)
Triglycerides: 74 mg/dL (ref 0–149)
VLDL Cholesterol Cal: 15 mg/dL (ref 5–40)

## 2021-08-17 NOTE — Progress Notes (Signed)
Contacted via [place] ? ?Good morning Nataya, your labs have returned and overall remain stable.  No medication changes needed.  Keep up the great work!! ?Keep being incredible!!  Thank you for allowing me to participate in your care.  I appreciate you. ?Kindest regards, ?Iliany Losier ?

## 2021-09-19 ENCOUNTER — Encounter: Payer: Self-pay | Admitting: Nurse Practitioner

## 2021-09-19 NOTE — Progress Notes (Signed)
Attempted to contact patient x3 to schedule echocardiogram, unable to contact, letter sent, order removed.

## 2021-09-21 ENCOUNTER — Ambulatory Visit: Payer: Self-pay

## 2021-09-21 DIAGNOSIS — R609 Edema, unspecified: Secondary | ICD-10-CM | POA: Diagnosis not present

## 2021-09-21 NOTE — Telephone Encounter (Signed)
Pt is calling report that her legs, feet, and ankles are swollen. Please advise           Voice mailbox not set up, unable to leave a message.

## 2021-09-21 NOTE — Telephone Encounter (Signed)
  Chief Complaint: bilateral leg swelling Symptoms: bilateral leg swelling Frequency: over 1 month Pertinent Negatives: Patient denies chest pain, difficulty breathing Disposition: '[]'$ ED /'[]'$ Urgent Care (no appt availability in office) / '[x]'$ Appointment(In office/virtual)/ '[]'$  [place] [facility]/ '[]'$ Home Care/ '[]'$ Refused Recommended Disposition /'[]'$ [place] [facility]/ '[]'$  Follow-up with PCP Additional Notes: Bilateral leg swelling over 1 month- not going away with elevation. Appointment scheduled Monday- advised UC for changes over the week end.   Reason for Disposition  [1] MODERATE leg swelling (e.g., swelling extends up to knees) AND [2] new-onset or worsening  Answer Assessment - Initial Assessment Questions 1. ONSET: "When did the swelling start?" (e.g., minutes, hours, days)     Over 1 month 2. LOCATION: "What part of the leg is swollen?"  "Are both legs swollen or just one leg?"     Both sides- L leg worse 3. SEVERITY: "How bad is the swelling?" (e.g., localized; mild, moderate, severe)  - Localized - small area of swelling localized to one leg  - MILD pedal edema - swelling limited to foot and ankle, pitting edema < 1/4 inch (6 mm) deep, rest and elevation eliminate most or all swelling  - MODERATE edema - swelling of lower leg to knee, pitting edema > 1/4 inch (6 mm) deep, rest and elevation only partially reduce swelling  - SEVERE edema - swelling extends above knee, facial or hand swelling present      moderate 4. REDNESS: "Does the swelling look red or infected?"     no 5. PAIN: "Is the swelling painful to touch?" If Yes, ask: "How painful is it?"   (Scale 1-10; mild, moderate or severe)     Left leg pain- throbbing 6. FEVER: "Do you have a fever?" If Yes, ask: "What is it, how was it measured, and when did it start?"      no 7. CAUSE: "What do you think is causing the leg swelling?"     unsure 8. MEDICAL HISTORY: "Do you have a history of heart failure, kidney disease,  liver failure, or cancer?"     Heart murmer, high BP 9. RECURRENT SYMPTOM: "Have you had leg swelling before?" If Yes, ask: "When was the last time?" "What happened that time?"     No- not like this 10. OTHER SYMPTOMS: "Do you have any other symptoms?" (e.g., chest pain, difficulty breathing)       no 11. PREGNANCY: "Is there any chance you are pregnant?" "When was your last menstrual period?"  Protocols used: Leg Swelling and Edema-A-AH

## 2021-09-24 ENCOUNTER — Ambulatory Visit (INDEPENDENT_AMBULATORY_CARE_PROVIDER_SITE_OTHER): Payer: Medicare HMO | Admitting: Physician Assistant

## 2021-09-24 ENCOUNTER — Encounter: Payer: Self-pay | Admitting: Physician Assistant

## 2021-09-24 DIAGNOSIS — R600 Localized edema: Secondary | ICD-10-CM

## 2021-09-24 DIAGNOSIS — R011 Cardiac murmur, unspecified: Secondary | ICD-10-CM

## 2021-09-24 NOTE — Assessment & Plan Note (Signed)
Ongoing, appears chronic based on chart review Systolic 2/6 murmur on auscultation today Unsure if related to increased bilateral lower extremity edema  Recommend she have echocardiogram to evaluate heart structures and function to further assist with management  Follow up as needed for persistent or progressing symptoms Continue current medications as directed.

## 2021-09-24 NOTE — Progress Notes (Signed)
Established Patient Office Visit  Name: Jillian Pierce   MRN: [id number]    DOB: 1956/01/01   Date:09/24/2021  Today's Provider: Talitha Givens, MHS, PA-C Introduced myself to the patient as a PA-C and provided education on APPs in clinical practice.         Subjective  Chief Complaint  Chief Complaint  Patient presents with   Leg Swelling    B/L leg swelling for past month. Patient states that she is having a burning/tingling sensation in b/l legs.     HPI  States she has had swelling in legs and feet for the past month States it extends to level of knee  Unsure of inciting events States she has not been on her feet more than normal the past month Reports she has tried to wear compression garments but has had trouble finding some that fit her legs   States she has SOB on exertion with walking - been ongoing for some time  States she thinks she has excess weight when she notices more swelling.    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[place]) 07/03/2018   Allergic rhinitis 04/03/2018   Hyperlipidemia associated with type 2 diabetes mellitus ([place]) 03/05/2018   Morbid obesity ([place]) 02/15/2016   Type 2 diabetes mellitus with diabetic neuropathy, unspecified ([place]) 03/23/2015   Hypertension associated with diabetes ([place]) 03/23/2015    Past Surgical History:  Procedure Laterality Date   ABDOMINAL HYSTERECTOMY     complete   ACHILLES TENDON SURGERY Left    COLONOSCOPY WITH PROPOFOL N/A 11/03/2018   Procedure: COLONOSCOPY WITH PROPOFOL;  Surgeon: Lin Landsman, MD;  Location: [facility] ENDOSCOPY;  Service: Gastroenterology;  Laterality: N/A;   HERNIA  REPAIR  11/2014   INSERTION OF MESH N/A 11/23/2014   Procedure: INSERTION OF MESH;  Surgeon: Sherri Rad, MD;  Location: [facility] ORS;  Service: General;  Laterality: N/A;   JOINT REPLACEMENT Left 12/18/2018   VENTRAL HERNIA REPAIR N/A 11/23/2014   Procedure: HERNIA REPAIR VENTRAL ADULT;  Surgeon: Sherri Rad, MD;  Location: [facility] ORS;  Service: General;  Laterality: N/A;    Family History  Problem Relation Age of Onset   Hypertension Mother    Diabetes Mother    Kidney disease Mother    Hypertension Father    Diabetes Father    Heart failure Father    Heart disease Sister    Heart disease Brother    Hypertension Son    Breast cancer Maternal Aunt     Social History   Tobacco Use   Smoking status: Never   Smokeless tobacco: Never  Substance Use Topics   Alcohol use: No     Current Outpatient Medications:    albuterol (VENTOLIN HFA) 108 (90 Base) MCG/ACT inhaler, Inhale 2 puffs into the lungs every 6 (six) hours as needed for wheezing or shortness of breath., Disp: 9 g, Rfl: 1   amLODipine (NORVASC) 10 MG tablet, Take 1 tablet (10 mg total) by mouth daily., Disp: 90 tablet, Rfl: 4   Ascorbic Acid (VITAMIN C PO), Take by mouth daily., Disp: , Rfl:    aspirin EC 81 MG  tablet, Take 81 mg by mouth daily., Disp: , Rfl:    atorvastatin (LIPITOR) 10 MG tablet, Take 1 tablet (10 mg total) by mouth daily., Disp: 90 tablet, Rfl: 4   cloNIDine (CATAPRES) 0.3 MG tablet, Take 1 tablet (0.3 mg total) by mouth 2 (two) times daily., Disp: 180 tablet, Rfl: 4   fluticasone (FLONASE) 50 MCG/ACT nasal spray, Place 2 sprays into both nostrils daily., Disp: 16 g, Rfl: 6   furosemide (LASIX) 20 MG tablet, Take 1 tablet (20 mg total) by mouth daily., Disp: 90 tablet, Rfl: 4   gabapentin (NEURONTIN) 600 MG tablet, Take 0.5 tablets (300 mg total) by mouth at bedtime., Disp: 45 tablet, Rfl: 4   lisinopril (ZESTRIL) 10 MG tablet, Take 1 tablet (10 mg total) by mouth daily., Disp: 90 tablet, Rfl: 4   meloxicam  (MOBIC) 15 MG tablet, Take 15 mg by mouth daily. As needed, Disp: , Rfl:    metFORMIN (GLUCOPHAGE) 500 MG tablet, TAKE ONE-HALF TABLET BY MOUTH TWICE DAILY, Disp: 180 tablet, Rfl: 4   VITAMIN D PO, Take by mouth daily., Disp: , Rfl:    Cyanocobalamin (VITAMIN B 12 PO), Take by mouth daily., Disp: , Rfl:    oxyCODONE-acetaminophen (PERCOCET/ROXICET) 5-325 MG tablet, Take 1 tablet by mouth every 4 (four) hours as needed for severe pain. (Patient not taking: Reported on 09/24/2021), Disp: , Rfl:    [START ON 10/11/2021] Zoster Vaccine Adjuvanted [facility]) injection, Inject 0.5 mLs into the muscle once for 1 dose. Dose # 2 (Patient not taking: Reported on 09/24/2021), Disp: 0.5 mL, Rfl: 0  No Known Allergies  I personally reviewed active problem list, medication list, allergies with the patient/caregiver today.   Review of Systems  Respiratory:  Positive for shortness of breath (on exertion).   Cardiovascular:  Positive for leg swelling. Negative for chest pain, palpitations and PND.  Neurological:  Positive for tingling.     Objective  Vitals:   09/24/21 0924 09/24/21 0928  BP: (!) 168/100 (!) 160/98  Pulse: 80 83  Temp: 98.8 F (37.1 C)   TempSrc: Oral   SpO2: 96%   Weight: 253 lb 9.6 oz (115 kg)   Height: 5' (1.524 m)     Body mass index is 49.53 kg/m.  Physical Exam Vitals reviewed.  Constitutional:      General: She is awake.     Appearance: Normal appearance. She is well-developed. She is obese.  HENT:     Head: Normocephalic and atraumatic.  Cardiovascular:     Rate and Rhythm: Normal rate and regular rhythm.     Pulses:          Radial pulses are 2+ on the right side and 2+ on the left side.     Heart sounds: Murmur heard.  Systolic murmur is present with a grade of 2/6.  Pulmonary:     Effort: Pulmonary effort is normal.     Breath sounds: Normal breath sounds. No decreased breath sounds, wheezing, rhonchi or rales.  Musculoskeletal:     Right lower leg: 4+ Edema  present.     Left lower leg: 3+ Edema present.  Neurological:     Mental Status: She is alert.  Psychiatric:        Attention and Perception: Attention normal.        Mood and Affect: Mood and affect normal.        Speech: Speech normal.        Behavior: Behavior normal. Behavior is cooperative.  Recent Results (from the past 2160 hour(s))  Bayer DCA Hb A1c Waived     Status: Abnormal   Collection Time: 08/16/21  9:43 AM  Result Value Ref Range   HB A1C (BAYER DCA - WAIVED) 6.1 (H) 4.8 - 5.6 %    Comment:          Prediabetes: 5.7 - 6.4          Diabetes: >6.4          Glycemic control for adults with diabetes: <7.0   Microalbumin, Urine Waived     Status: Abnormal   Collection Time: 08/16/21  9:43 AM  Result Value Ref Range   Microalb, Ur Waived 80 (H) 0 - 19 mg/L   Creatinine, Urine Waived 100 10 - 300 mg/dL   Microalb/Creat Ratio 30-300 (H) <30 mg/g    Comment:                              Abnormal:       30 - 300                         High Abnormal:           >585   Basic metabolic panel     Status: Abnormal   Collection Time: 08/16/21  9:45 AM  Result Value Ref Range   Glucose 118 (H) 70 - 99 mg/dL   BUN 15 8 - 27 mg/dL   Creatinine, Ser 0.85 0.57 - 1.00 mg/dL   eGFR 75 >59 mL/min/1.73   BUN/Creatinine Ratio 18 12 - 28   Sodium 142 134 - 144 mmol/L   Potassium 4.2 3.5 - 5.2 mmol/L   Chloride 104 96 - 106 mmol/L   CO2 21 20 - 29 mmol/L   Calcium 9.8 8.7 - 10.3 mg/dL  Lipid Panel w/o Chol/HDL Ratio     Status: None   Collection Time: 08/16/21  9:45 AM  Result Value Ref Range   Cholesterol, Total 119 100 - 199 mg/dL   Triglycerides 74 0 - 149 mg/dL   HDL 48 >39 mg/dL   VLDL Cholesterol Cal 15 5 - 40 mg/dL   LDL Chol Calc (NIH) 56 0 - 99 mg/dL     PHQ2/9:    09/24/2021    9:41 AM 08/16/2021   10:04 AM 06/28/2021   11:32 AM 02/15/2021    9:51 AM 06/26/2020   11:17 AM  Depression screen PHQ 2/9  Decreased Interest 0 0 0 0 0  Down, Depressed, Hopeless 0 0 0  0 0  PHQ - 2 Score 0 0 0 0 0  Altered sleeping 1 0  0   Tired, decreased energy 1 1  0   Change in appetite 0 0  0   Feeling bad or failure about yourself  0 0  0   Trouble concentrating 0 0  0   Moving slowly or fidgety/restless 0 0  0   Suicidal thoughts 0 0  0   PHQ-9 Score 2 1  0   Difficult doing work/chores  Not difficult at all         Fall Risk:    09/24/2021    9:41 AM 06/28/2021   11:17 AM 02/15/2021   10:12 AM 02/15/2021    9:51 AM 06/26/2020   11:17 AM  Fall Risk   Falls in the past year? 0 0 0 0  0  Number falls in past yr: 0 0 0 0   Injury with Fall? 0 0 0 0   Risk for fall due to : No Fall Risks  No Fall Risks No Fall Risks Medication side effect  Follow up Falls evaluation completed Falls evaluation completed;Falls prevention discussed Falls evaluation completed Falls prevention discussed Falls evaluation completed;Education provided;Falls prevention discussed      Functional Status Survey:      Assessment & Plan  Problem List Items Addressed This Visit       Other   Heart murmur    Ongoing, appears chronic based on chart review Systolic 2/6 murmur on auscultation today Unsure if related to increased bilateral lower extremity edema  Recommend she have echocardiogram to evaluate heart structures and function to further assist with management  Follow up as needed for persistent or progressing symptoms Continue current medications as directed.         Edema of both legs    Chronic, ongoing Patient reports persistent swelling to level of knee along with SOB on exertion  Denies PND or chest pains Reviewed previous labs from 08/16/21 Recommend she schedule echocardiogram as discussed previously - reviewed importance of this procedure to help [place] with dx of potential causes Reviewed compression stockings - provided fit information along with compression level to assist with symptoms Follow up as needed for persistent or progressing symptoms.           No follow-ups on file.   I, Marisa Hage E Shem Plemmons, PA-C, have reviewed all documentation for this visit. The documentation on 09/24/21 for the exam, diagnosis, procedures, and orders are all accurate and complete.   Talitha Givens, MHS, PA-C [facility]

## 2021-09-24 NOTE — Patient Instructions (Addendum)
I recommend that we proceed with the Echocardiogram that was ordered in April. This is slated to be performed at [facility] or [facility]. If you'd like you can call them to see if you can schedule the apt [phone] If you are still having trouble please call us and we can try to help out with scheduling   Your calves are 15-16 inches around - I recommend getting compression garments from a medical supply store so you can be sure you are getting some that fit and are the appropriate level of compression I recommend 15-20 mmHg of compression to assist with your swelling.   Continue taking your medications as prescribed  Make sure you take your Lasix to assist with fluid retention   It was nice to meet you and I appreciate the opportunity to be involved in your care

## 2021-09-24 NOTE — Assessment & Plan Note (Addendum)
Chronic, ongoing Patient reports persistent swelling to level of knee along with SOB on exertion  Denies PND or chest pains Reviewed previous labs from 08/16/21 Recommend she schedule echocardiogram as discussed previously - reviewed importance of this procedure to help [place] with dx of potential causes Reviewed compression stockings - provided fit information along with compression level to assist with symptoms Follow up as needed for persistent or progressing symptoms.

## 2021-09-25 ENCOUNTER — Other Ambulatory Visit: Payer: Self-pay | Admitting: Nurse Practitioner

## 2021-09-25 DIAGNOSIS — R011 Cardiac murmur, unspecified: Secondary | ICD-10-CM

## 2021-09-26 ENCOUNTER — Ambulatory Visit (INDEPENDENT_AMBULATORY_CARE_PROVIDER_SITE_OTHER): Payer: Medicare HMO

## 2021-09-26 DIAGNOSIS — R011 Cardiac murmur, unspecified: Secondary | ICD-10-CM | POA: Diagnosis not present

## 2021-09-26 LAB — ECHOCARDIOGRAM COMPLETE
Area-P 1/2: 2.85 cm2
Calc EF: 52.7 %
P 1/2 time: 550 msec
S' Lateral: 1.8 cm
Single Plane A2C EF: 45.8 %
Single Plane A4C EF: 60 %

## 2021-09-27 ENCOUNTER — Telehealth: Payer: Self-pay

## 2021-09-27 NOTE — Progress Notes (Signed)
Contacted via MyChart   Good morning Jillian Pierce, your echo of the heart has returned and overall shows a good pump of the heart with ejection fraction at 60-65%.  There is some mild impairment of relaxation of the heart and mild enlargement of left ventricle which we can see with chronic diseases over time, we will continue to monitor this.  Overall do not suspect heart is causing edema.  I do recommend wearing compression hose daily and take off at night.  If ongoing issues may send you to vascular or the heart doctor for further assessment.  Any questions? Keep being stellar!!  Thank you for allowing me to participate in your care.  I appreciate you. Kindest regards, Kazuki Ingle

## 2021-09-27 NOTE — Telephone Encounter (Signed)
Copied from [place] [id number]. Topic: General - Inquiry >> Sep 27, 2021  9:42 AM Marcellus Scott wrote: Reason for CRM: Pt stated they looked at her heart yesterday (echocardiogram) and she would like a call back with results.   Result note sent to patient's mychart from [place]. Called and spoke with patient. States she cannot see the message on mychart. Read the result message to the patient. No further questions at this time.

## 2021-10-30 DIAGNOSIS — Z0100 Encounter for examination of eyes and vision without abnormal findings: Secondary | ICD-10-CM | POA: Diagnosis not present

## 2021-10-30 DIAGNOSIS — H2513 Age-related nuclear cataract, bilateral: Secondary | ICD-10-CM | POA: Diagnosis not present

## 2021-10-30 DIAGNOSIS — E119 Type 2 diabetes mellitus without complications: Secondary | ICD-10-CM | POA: Diagnosis not present

## 2021-11-23 ENCOUNTER — Other Ambulatory Visit: Payer: Self-pay

## 2021-11-23 DIAGNOSIS — E1159 Type 2 diabetes mellitus with other circulatory complications: Secondary | ICD-10-CM

## 2021-11-23 DIAGNOSIS — E1140 Type 2 diabetes mellitus with diabetic neuropathy, unspecified: Secondary | ICD-10-CM

## 2021-11-23 MED ORDER — TRUEPLUS LANCETS 33G MISC: 4 refills | Status: AC

## 2021-11-23 MED ORDER — TRUE METRIX AIR GLUCOSE METER W/DEVICE KIT: PACK | 1 refills | Status: DC

## 2022-01-28 ENCOUNTER — Telehealth: Payer: Self-pay | Admitting: Nurse Practitioner

## 2022-01-28 MED ORDER — RELION TRUE METRIX TEST STRIPS VI STRP
ORAL_STRIP | 12 refills | Status: DC
Start: 2022-01-28 — End: 2022-01-29

## 2022-01-28 NOTE — Telephone Encounter (Signed)
Medication Refill - Medication: True Metrix test strips  Has the patient contacted their pharmacy? yes (Agent: If no, request that the patient contact the pharmacy for the refill. If patient does not wish to contact the pharmacy document the reason why and proceed with request.) (Agent: If yes, when and what did the pharmacy advise?)contact pcp  Preferred Pharmacy (with phone number or street name):  [facility] [place] - [address] Phone:  [phone]  Fax:  [phone]     Has the patient been seen for an appointment in the last year OR does the patient have an upcoming appointment? yes  Agent: Please be advised that RX refills may take up to 3 business days. We ask that you follow-up with your pharmacy.

## 2022-01-29 ENCOUNTER — Other Ambulatory Visit: Payer: Self-pay | Admitting: Nurse Practitioner

## 2022-01-29 NOTE — Telephone Encounter (Signed)
Requested medication (s) are due for refill today: no  Requested medication (s) are on the active medication list: yes  Last refill:  yesterday  Future visit scheduled: yes  Notes to clinic:  Pharmacy comment: True metrix test strips are not available at [facility].     Requested Prescriptions  Pending Prescriptions Disp Refills   TRUE METRIX BLOOD GLUCOSE TEST test strip [Pharmacy Med Name: TRUE METRIX GLUCOSE TES] 100 each 12    Sig: USE AS DIRECTED     Endocrinology: Diabetes - Testing Supplies Passed - 01/29/2022  8:52 AM      Passed - Valid encounter within last 12 months    Recent Outpatient Visits           4 months ago Edema of both legs   [facility] Mecum, Erin E, PA-C   5 months ago Type 2 diabetes mellitus with diabetic neuropathy, without long-term current use of insulin ([place])   [place], Jolene T, NP   8 months ago COVID-19 virus RNA test result positive at limit of detection   [facility], Barbaraann Faster, NP   10 months ago Acute cough   [place], [place] T, NP   11 months ago Diabetes mellitus with proteinuria ([place])   [place], Barbaraann Faster, NP       Future Appointments             In 3 weeks Cann[place], Barbaraann Faster, NP [employer]

## 2022-01-30 ENCOUNTER — Other Ambulatory Visit: Payer: Self-pay

## 2022-01-30 NOTE — Telephone Encounter (Signed)
Entered in error

## 2022-02-15 ENCOUNTER — Other Ambulatory Visit: Payer: Self-pay | Admitting: Nurse Practitioner

## 2022-02-15 NOTE — Telephone Encounter (Signed)
Requested medication (s) are due for refill today-expired Rx  Requested medication (s) are on the active medication list -yes  Future visit scheduled -yes  Last refill: 02/15/21 #45 4RF  Notes to clinic: expired Rx  Requested Prescriptions  Pending Prescriptions Disp Refills   gabapentin (NEURONTIN) 600 MG tablet [Pharmacy Med Name: GABAPENTIN 600 MG Tablet] 45 tablet 10    Sig: TAKE 1/2 TABLET AT BEDTIME     Neurology: Anticonvulsants - gabapentin Passed - 02/15/2022  3:38 PM      Passed - Cr in normal range and within 360 days    Creatinine  Date Value Ref Range Status  06/03/2013 0.77 0.60 - 1.30 mg/dL Final   Creatinine, Ser  Date Value Ref Range Status  08/16/2021 0.85 0.57 - 1.00 mg/dL Final         Passed - Completed PHQ-2 or PHQ-9 in the last 360 days      Passed - Valid encounter within last 12 months    Recent Outpatient Visits           4 months ago Edema of both legs   [facility] Mecum, Erin E, PA-C   6 months ago Type 2 diabetes mellitus with diabetic neuropathy, without long-term current use of insulin ([place])   [place], Jolene T, NP   8 months ago COVID-19 virus RNA test result positive at limit of detection   [employer], Henrine Screws T, NP   11 months ago Acute cough   [place], [place] T, NP   1 year ago Diabetes mellitus with proteinuria ([place])   [place], Barbaraann Faster, NP       Future Appointments             In 5 days Cannady, Barbaraann Faster, NP [employer]               Requested Prescriptions  Pending Prescriptions Disp Refills   gabapentin (NEURONTIN) 600 MG tablet [Pharmacy Med Name: GABAPENTIN 600 MG Tablet] 45 tablet 10    Sig: TAKE 1/2 TABLET AT BEDTIME     Neurology: Anticonvulsants - gabapentin Passed - 02/15/2022  3:38 PM      Passed - Cr in normal range and within 360 days    Creatinine  Date Value Ref Range  Status  06/03/2013 0.77 0.60 - 1.30 mg/dL Final   Creatinine, Ser  Date Value Ref Range Status  08/16/2021 0.85 0.57 - 1.00 mg/dL Final         Passed - Completed PHQ-2 or PHQ-9 in the last 360 days      Passed - Valid encounter within last 12 months    Recent Outpatient Visits           4 months ago Edema of both legs   [place], Erin E, PA-C   6 months ago Type 2 diabetes mellitus with diabetic neuropathy, without long-term current use of insulin ([place])   [place], Jolene T, NP   8 months ago COVID-19 virus RNA test result positive at limit of detection   [facility], Barbaraann Faster, NP   11 months ago Acute cough   [place], [place] T, NP   1 year ago Diabetes mellitus with proteinuria ([place])   [place], Barbaraann Faster, NP       Future Appointments  In 5 days Cannady, Barbaraann Faster, NP [employer]

## 2022-02-17 NOTE — Patient Instructions (Incomplete)
Please call to schedule your mammogram and/or bone density: [facility] [place]: [address], [place], [place] [address] Phone: [phone]  [place] [facility] [address] [place],  [place]  [address] Phone: [phone]    Think about Trulicity or Ozempic.  Diabetes Mellitus Basics  Diabetes mellitus, or diabetes, is a long-term (chronic) disease. It occurs when the body does not properly use sugar (glucose) that is released from food after you eat. Diabetes mellitus may be caused by one or both of these problems: Your pancreas does not make enough of a hormone called insulin. Your body does not react in a normal way to the insulin that it makes. Insulin lets glucose enter cells in your body. This gives you energy. If you have diabetes, glucose cannot get into cells. This causes high blood glucose (hyperglycemia). How to treat and manage diabetes You may need to take insulin or other diabetes medicines daily to keep your glucose in balance. If you are prescribed insulin, you will learn how to give yourself insulin by injection. You may need to adjust the amount of insulin you take based on the foods that you eat. You will need to check your blood glucose levels using a glucose monitor as told by your health care provider. The readings can help determine if you have low or high blood glucose. Generally, you should have these blood glucose levels: Before meals (preprandial): 80-130 mg/dL (4.4-7.2 mmol/L). After meals (postprandial): below 180 mg/dL (10 mmol/L). Hemoglobin A1c (HbA1c) level: less than 7%. Your health care provider will set treatment goals for you. Keep all follow-up visits. This is important. Follow these instructions at home: Diabetes medicines Take your diabetes medicines every day as told by your health care provider. List your diabetes medicines here: Name of medicine:  ______________________________ Amount (dose): _______________ Time (a.m./p.m.): _______________ Notes: ___________________________________ Name of medicine: ______________________________ Amount (dose): _______________ Time (a.m./p.m.): _______________ Notes: ___________________________________ Name of medicine: ______________________________ Amount (dose): _______________ Time (a.m./p.m.): _______________ Notes: ___________________________________ Insulin If you use insulin, list the types of insulin you use here: Insulin type: ______________________________ Amount (dose): _______________ Time (a.m./p.m.): _______________Notes: ___________________________________ Insulin type: ______________________________ Amount (dose): _______________ Time (a.m./p.m.): _______________ Notes: ___________________________________ Insulin type: ______________________________ Amount (dose): _______________ Time (a.m./p.m.): _______________ Notes: ___________________________________ Insulin type: ______________________________ Amount (dose): _______________ Time (a.m./p.m.): _______________ Notes: ___________________________________ Insulin type: ______________________________ Amount (dose): _______________ Time (a.m./p.m.): _______________ Notes: ___________________________________ Managing blood glucose  Check your blood glucose levels using a glucose monitor as told by your health care provider. Write down the times that you check your glucose levels here: Time: _______________ Notes: ___________________________________ Time: _______________ Notes: ___________________________________ Time: _______________ Notes: ___________________________________ Time: _______________ Notes: ___________________________________ Time: _______________ Notes: ___________________________________ Time: _______________ Notes: ___________________________________  Low blood glucose Low blood glucose (hypoglycemia) is when  glucose is at or below 70 mg/dL (3.9 mmol/L). Symptoms may include: Feeling: Hungry. Sweaty and clammy. Irritable or easily upset. Dizzy. Sleepy. Having: A fast heartbeat. A headache. A change in your vision. Numbness around the mouth, lips, or tongue. Having trouble with: Moving (coordination). Sleeping. Treating low blood glucose To treat low blood glucose, eat or drink something containing sugar right away. If you can think clearly and swallow safely, follow the 15:15 rule: Take 15 grams of a fast-acting carb (carbohydrate), as told by your health care provider. Some fast-acting carbs are: Glucose tablets: take 3-4 tablets. Hard candy: eat 3-5 pieces. Fruit juice: drink 4 oz (120 mL). Regular (not diet)  soda: drink 4-6 oz (120-180 mL). Honey or sugar: eat 1 Tbsp (15 mL). Check your blood glucose levels 15 minutes after you take the carb. If your glucose is still at or below 70 mg/dL (3.9 mmol/L), take 15 grams of a carb again. If your glucose does not go above 70 mg/dL (3.9 mmol/L) after 3 tries, get help right away. After your glucose goes back to normal, eat a meal or a snack within 1 hour. Treating very low blood glucose If your glucose is at or below 54 mg/dL (3 mmol/L), you have very low blood glucose (severe hypoglycemia). This is an emergency. Do not wait to see if the symptoms will go away. Get medical help right away. Call your local emergency services (911 in the [place] Do not drive yourself to the hospital. Questions to ask your health care provider Should I talk with a diabetes educator? What equipment will I need to care for myself at home? What diabetes medicines do I need? When should I take them? How often do I need to check my blood glucose levels? What number can I call if I have questions? When is my follow-up visit? Where can I find a support group for people with diabetes? Where to find more information American Diabetes Association:  www.diabetes.org Association of Diabetes Care and Education Specialists: www.diabeteseducator.org Contact a health care provider if: Your blood glucose is at or above 240 mg/dL (13.3 mmol/L) for 2 days in a row. You have been sick or have had a fever for 2 days or more, and you are not getting better. You have any of these problems for more than 6 hours: You cannot eat or drink. You feel nauseous. You vomit. You have diarrhea. Get help right away if: Your blood glucose is lower than 54 mg/dL (3 mmol/L). You get confused. You have trouble thinking clearly. You have trouble breathing. These symptoms may represent a serious problem that is an emergency. Do not wait to see if the symptoms will go away. Get medical help right away. Call your local emergency services (911 in the [place] Do not drive yourself to the hospital. Summary Diabetes mellitus is a chronic disease that occurs when the body does not properly use sugar (glucose) that is released from food after you eat. Take insulin and diabetes medicines as told. Check your blood glucose every day, as often as told. Keep all follow-up visits. This is important. This information is not intended to replace advice given to you by your health care provider. Make sure you discuss any questions you have with your health care provider. Document Revised: 08/10/2019 Document Reviewed: 08/10/2019 Elsevier Patient Education  [place].

## 2022-02-20 ENCOUNTER — Ambulatory Visit (INDEPENDENT_AMBULATORY_CARE_PROVIDER_SITE_OTHER): Payer: Medicare HMO | Admitting: Nurse Practitioner

## 2022-02-20 ENCOUNTER — Encounter: Payer: Self-pay | Admitting: Nurse Practitioner

## 2022-02-20 VITALS — BP 123/80 | HR 87 | Temp 98.5°F | Ht 60.25 in | Wt 259.7 lb

## 2022-02-20 DIAGNOSIS — Z0000 Encounter for general adult medical examination without abnormal findings: Secondary | ICD-10-CM | POA: Diagnosis not present

## 2022-02-20 DIAGNOSIS — R809 Proteinuria, unspecified: Secondary | ICD-10-CM | POA: Diagnosis not present

## 2022-02-20 DIAGNOSIS — I152 Hypertension secondary to endocrine disorders: Secondary | ICD-10-CM

## 2022-02-20 DIAGNOSIS — E538 Deficiency of other specified B group vitamins: Secondary | ICD-10-CM

## 2022-02-20 DIAGNOSIS — E1159 Type 2 diabetes mellitus with other circulatory complications: Secondary | ICD-10-CM

## 2022-02-20 DIAGNOSIS — E1129 Type 2 diabetes mellitus with other diabetic kidney complication: Secondary | ICD-10-CM

## 2022-02-20 DIAGNOSIS — E1169 Type 2 diabetes mellitus with other specified complication: Secondary | ICD-10-CM | POA: Diagnosis not present

## 2022-02-20 DIAGNOSIS — M85852 Other specified disorders of bone density and structure, left thigh: Secondary | ICD-10-CM | POA: Diagnosis not present

## 2022-02-20 DIAGNOSIS — E559 Vitamin D deficiency, unspecified: Secondary | ICD-10-CM

## 2022-02-20 DIAGNOSIS — R011 Cardiac murmur, unspecified: Secondary | ICD-10-CM | POA: Diagnosis not present

## 2022-02-20 DIAGNOSIS — E1140 Type 2 diabetes mellitus with diabetic neuropathy, unspecified: Secondary | ICD-10-CM | POA: Diagnosis not present

## 2022-02-20 DIAGNOSIS — E785 Hyperlipidemia, unspecified: Secondary | ICD-10-CM

## 2022-02-20 DIAGNOSIS — Z1231 Encounter for screening mammogram for malignant neoplasm of breast: Secondary | ICD-10-CM

## 2022-02-20 DIAGNOSIS — Z23 Encounter for immunization: Secondary | ICD-10-CM

## 2022-02-20 DIAGNOSIS — J3489 Other specified disorders of nose and nasal sinuses: Secondary | ICD-10-CM | POA: Insufficient documentation

## 2022-02-20 LAB — BAYER DCA HB A1C WAIVED: HB A1C (BAYER DCA - WAIVED): 6.9 % — ABNORMAL HIGH (ref 4.8–5.6)

## 2022-02-20 MED ORDER — AMLODIPINE BESYLATE 10 MG PO TABS: 10.0000 mg | ORAL_TABLET | Freq: Every day | ORAL | 4 refills | Status: DC

## 2022-02-20 MED ORDER — CLONIDINE HCL 0.3 MG PO TABS: 0.3000 mg | ORAL_TABLET | Freq: Two times a day (BID) | ORAL | 4 refills | Status: DC

## 2022-02-20 MED ORDER — ATORVASTATIN CALCIUM 10 MG PO TABS: 10.0000 mg | ORAL_TABLET | Freq: Every day | ORAL | 4 refills | Status: DC

## 2022-02-20 MED ORDER — PREDNISONE 20 MG PO TABS: 40.0000 mg | ORAL_TABLET | Freq: Every day | ORAL | 0 refills | Status: AC

## 2022-02-20 MED ORDER — FUROSEMIDE 20 MG PO TABS: 20.0000 mg | ORAL_TABLET | Freq: Every day | ORAL | 4 refills | Status: DC

## 2022-02-20 MED ORDER — ALBUTEROL SULFATE HFA 108 (90 BASE) MCG/ACT IN AERS: 2.0000 | INHALATION_SPRAY | Freq: Four times a day (QID) | RESPIRATORY_TRACT | 1 refills | Status: AC | PRN

## 2022-02-20 MED ORDER — LISINOPRIL 10 MG PO TABS: 10.0000 mg | ORAL_TABLET | Freq: Every day | ORAL | 4 refills | Status: DC

## 2022-02-20 MED ORDER — GABAPENTIN 600 MG PO TABS: 300.0000 mg | ORAL_TABLET | Freq: Every day | ORAL | 10 refills | Status: DC

## 2022-02-20 MED ORDER — TRUE METRIX BLOOD GLUCOSE TEST VI STRP: ORAL_STRIP | 12 refills | Status: DC

## 2022-02-20 MED ORDER — METFORMIN HCL 500 MG PO TABS: ORAL_TABLET | ORAL | 4 refills | Status: DC

## 2022-02-20 NOTE — Assessment & Plan Note (Signed)
Started 2 days ago.  At this time would not start abx therapy, discussed with patient.  Since diabetes remains well-controlled will send in Prednisone 40 MG daily for 5 days -- educated patient on this and sugar levels.  Recommend: - Increased rest - Increasing Fluids - Acetaminophen as needed for fever/pain.  - Salt water gargling, chloraseptic spray and throat lozenges - OTC Coricidin and Claritin - Mucinex.  - Humidifying the air. Return if ongoing or worsening, consider addition of abx therapy if >7 days.

## 2022-02-20 NOTE — Progress Notes (Signed)
BP 123/80   Pulse 87   Temp 98.5 F (36.9 C) (Oral)   Ht 5' 0.25" (1.53 m)   Wt 259 lb 11.2 oz (117.8 kg)   SpO2 97%   BMI 50.30 kg/m    Subjective:    Patient ID: Jillian Pierce, female    DOB: May 20, 1955, 66 y.o.   MRN: [id number]  HPI: Jillian Pierce is a 66 y.o. female presenting on 02/20/2022 for comprehensive medical examination. Current medical complaints include: cold symptoms  She currently lives with: self Menopausal Symptoms: no  UPPER RESPIRATORY TRACT INFECTION Started two days ago, with sinus issues.  Covid negative on home testing.  Has been Covid vaccinated. Fever: no Cough: yes Shortness of breath: no Wheezing: no Chest pain: no Chest tightness: yes Chest congestion: yes Nasal congestion: yes Runny nose: yes Post nasal drip: yes Sneezing: no Sore throat: yes Swollen glands: no Sinus pressure: yes Headache: yes Face pain: yes Toothache: no Ear pain: none Ear pressure: none Eyes red/itching:no Eye drainage/crusting: no  Vomiting: no Rash: no Fatigue: yes Sick contacts: no Strep contacts: no  Context: fluctuating Recurrent sinusitis: no Relief with OTC cold/cough medications: yes  Treatments attempted: Tylenol and Ibuprofen + Mucinex    OSTEOPENIA DEXA on 01/03/21 noted this: Femur Neck Left is 0.735 g/cm2 with a T-score of -2.2. Continues on Vitamin D and B12 at home for history of low levels. Satisfied with current treatment?: yes Adequate calcium & vitamin D: yes Weight bearing exercises: yes   DIABETES A1c April 2023 was 6.1%.  Continues on Metformin 250 MG BID.  Taking Gabapentin for ongoing back & hip pain.   Hypoglycemic episodes:no Polydipsia/polyuria: no Visual disturbance: no Chest pain: no Paresthesias: no Glucose Monitoring: no             Accucheck frequency: Not Checking             Fasting glucose:             Post prandial:             Evening:             Before meals: Taking Insulin?: no             Long acting  insulin:             Short acting insulin: Blood Pressure Monitoring: not checking Retinal Examination: Not Up to Date -- [place] Exam: Up to Date Pneumovax: Up to Date Influenza: Up to Date Aspirin: yes    HYPERTENSION / HYPERLIPIDEMIA Continues on Lipitor and Norvasc, Lisinopril, Clonidine, and Lasix.   Echo on 02/28/20 noted EF 60-65% and moderate LVH with mild aortic valve sclerosis.   Satisfied with current treatment? yes Duration of hypertension: chronic BP monitoring frequency: not checking BP range:  BP medication side effects: no Duration of hyperlipidemia: chronic Cholesterol medication side effects: no Cholesterol supplements: none Medication compliance: good compliance Aspirin: yes Recent stressors: no Recurrent headaches: no Visual changes: no Palpitations: no Dyspnea: at times with long distance only -- uses Albuterol which offers benefit Chest pain: no Lower extremity edema: at baseline Dizzy/lightheaded: no      02/20/2022    9:58 AM 09/24/2021    9:41 AM 08/16/2021   10:04 AM 06/28/2021   11:32 AM 02/15/2021    9:51 AM  Depression screen PHQ 2/9  Decreased Interest 0 0 0 0 0  Down, Depressed, Hopeless 0 0 0 0 0  PHQ - 2  Score 0 0 0 0 0  Altered sleeping 0 1 0  0  Tired, decreased energy 0 1 1  0  Change in appetite 0 0 0  0  Feeling bad or failure about yourself  0 0 0  0  Trouble concentrating 0 0 0  0  Moving slowly or fidgety/restless 0 0 0  0  Suicidal thoughts 0 0 0  0  PHQ-9 Score 0 2 1  0  Difficult doing work/chores   Not difficult at all         02/20/2022    9:58 AM 09/24/2021    9:41 AM 08/16/2021   10:04 AM  GAD 7 : Generalized Anxiety Score  Nervous, Anxious, on Edge 0 0 0  Control/stop worrying 0 0 0  Worry too much - different things 0 0 0  Trouble relaxing 1 0 0  Restless 0 0 0  Easily annoyed or irritable 0 0 0  Afraid - awful might happen 0 0 0  Total GAD 7 Score 1 0 0     Functional Status Survey: Is the  patient deaf or have difficulty hearing?: No Does the patient have difficulty seeing, even when wearing glasses/contacts?: No Does the patient have difficulty concentrating, remembering, or making decisions?: No Does the patient have difficulty walking or climbing stairs?: No Does the patient have difficulty dressing or bathing?: No Does the patient have difficulty doing errands alone such as visiting a doctor's office or shopping?: No      02/15/2021   10:12 AM 06/28/2021   11:17 AM 09/24/2021    9:41 AM 02/20/2022    9:58 AM 02/20/2022   10:01 AM  Fall Risk  Falls in the past year? 0 0 0 0 0  Was there an injury with Fall? 0 0 0 0 0  Fall Risk Category Calculator 0 0 0 0 0  Fall Risk Category _0   Patient Fall Risk Level Low fall risk Low fall risk  Low fall risk Low fall risk  Patient at Risk for Falls Due to No Fall Risks  No Fall Risks No Fall Risks No Fall Risks  Fall risk Follow up Falls evaluation completed Falls evaluation completed;Falls prevention discussed Falls evaluation completed Falls evaluation completed Falls prevention discussed    A voluntary discussion about advance care planning including the explanation and discussion of advance directives was extensively discussed  with the patient for 15 minutes with patient.  Explanation about the health care proxy and Living will was reviewed and packet with forms with explanation of how to fill them out was given.  During this discussion, the patient was able to identify a health care proxy as son, Dantonio, and plans to fill out the paperwork required.  Patient was offered a separate [place] visit for further assistance with forms.     Past Medical History:  Past Medical History:  Diagnosis Date   Diabetes mellitus without complication ([place])    Hypertension    Left knee pain 05/17/2014   Morbid obesity ([place]) 02/15/2016    Surgical History:  Past Surgical History:  Procedure Laterality Date    ABDOMINAL HYSTERECTOMY     complete   ACHILLES TENDON SURGERY Left    COLONOSCOPY WITH PROPOFOL N/A 11/03/2018   Procedure: COLONOSCOPY WITH PROPOFOL;  Surgeon: Lin Landsman, MD;  Location: [place];  Service: Gastroenterology;  Laterality: N/A;   HERNIA REPAIR  11/2014   INSERTION OF MESH N/A 11/23/2014  Procedure: INSERTION OF MESH;  Surgeon: Sherri Rad, MD;  Location: [facility];  Service: General;  Laterality: N/A;   JOINT REPLACEMENT Left 12/18/2018   VENTRAL HERNIA REPAIR N/A 11/23/2014   Procedure: HERNIA REPAIR VENTRAL ADULT;  Surgeon: Sherri Rad, MD;  Location: [facility];  Service: General;  Laterality: N/A;    Medications:  Current Outpatient Medications on File Prior to Visit  Medication Sig   Ascorbic Acid (VITAMIN C PO) Take by mouth daily.   aspirin EC 81 MG tablet Take 81 mg by mouth daily.   Blood Glucose Monitoring Suppl (TRUE METRIX AIR GLUCOSE METER) w/Device KIT Use to check blood sugar 1-3 times a day.   Cyanocobalamin (VITAMIN B 12 PO) Take by mouth daily.   fluticasone (FLONASE) 50 MCG/ACT nasal spray Place 2 sprays into both nostrils daily.   meloxicam (MOBIC) 15 MG tablet Take 15 mg by mouth daily. As needed   TRUEplus Lancets 33G MISC Use to check blood sugar 1-3 times daily.   VITAMIN D PO Take by mouth daily.   No current facility-administered medications on file prior to visit.    Allergies:  No Known Allergies  Social History:  Social History   Socioeconomic History   Marital status: Single    Spouse name: Not on file   Number of children: Not on file   Years of education: Not on file   Highest education level: Not on file  Occupational History   Occupation: disability  Tobacco Use   Smoking status: Never   Smokeless tobacco: Never  Vaping Use   Vaping Use: Never used  Substance and Sexual Activity   Alcohol use: No   Drug use: No   Sexual activity: Not Currently  Other Topics Concern   Not on file  Social History Narrative   Not  on file   Social Determinants of Health   Financial Resource Strain: Low Risk  (06/28/2021)   Overall Financial Resource Strain (CARDIA)    Difficulty of Paying Living Expenses: Not hard at all  Food Insecurity: No Food Insecurity (06/28/2021)   Hunger Vital Sign    Worried About Running Out of Food in the Last Year: Never true    Ran Out of Food in the Last Year: Never true  Transportation Needs: No Transportation Needs (06/28/2021)   PRAPARE - Hydrologist (Medical): No    Lack of Transportation (Non-Medical): No  Physical Activity: Inactive (06/28/2021)   Exercise Vital Sign    Days of Exercise per Week: 0 days    Minutes of Exercise per Session: 0 min  Stress: No Stress Concern Present (06/28/2021)   Nectar    Feeling of Stress : Not at all  Social Connections: Unknown (06/28/2021)   Social Connection and Isolation Panel [NHANES]    Frequency of Communication with Friends and Family: More than three times a week    Frequency of Social Gatherings with Friends and Family: Once a week    Attends Religious Services: Never    Marine scientist or Organizations: No    Attends Archivist Meetings: Never    Marital Status: Not on file  Intimate Partner Violence: Not At Risk (06/28/2021)   Humiliation, Afraid, Rape, and Kick questionnaire    Fear of Current or Ex-Partner: No    Emotionally Abused: No    Physically Abused: No    Sexually Abused: No   Social History  Tobacco Use  Smoking Status Never  Smokeless Tobacco Never   Social History   Substance and Sexual Activity  Alcohol Use No    Family History:  Family History  Problem Relation Age of Onset   Hypertension Mother    Diabetes Mother    Kidney disease Mother    Hypertension Father    Diabetes Father    Heart failure Father    Heart disease Sister    Heart disease Brother    Hypertension Son    Breast  cancer Maternal Aunt     Past medical history, surgical history, medications, allergies, family history and social history reviewed with patient today and changes made to appropriate areas of the chart.   ROS All other ROS negative except what is listed above and in the HPI.      Objective:    BP 123/80   Pulse 87   Temp 98.5 F (36.9 C) (Oral)   Ht 5' 0.25" (1.53 m)   Wt 259 lb 11.2 oz (117.8 kg)   SpO2 97%   BMI 50.30 kg/m   Wt Readings from Last 3 Encounters:  02/20/22 259 lb 11.2 oz (117.8 kg)  09/24/21 253 lb 9.6 oz (115 kg)  08/16/21 251 lb (113.9 kg)    Physical Exam Vitals and nursing note reviewed. Exam conducted with a chaperone present.  Constitutional:      General: She is awake. She is not in acute distress.    Appearance: She is well-developed and well-groomed. She is obese. She is not ill-appearing or toxic-appearing.  HENT:     Head: Normocephalic and atraumatic.     Right Ear: Hearing, ear canal and external ear normal. No drainage. A middle ear effusion is present. Tympanic membrane is not injected.     Left Ear: Hearing, ear canal and external ear normal. No drainage. A middle ear effusion is present. Tympanic membrane is not injected.     Nose: Rhinorrhea present. Rhinorrhea is clear.     Right Sinus: Frontal sinus tenderness present. No maxillary sinus tenderness.     Left Sinus: Frontal sinus tenderness present. No maxillary sinus tenderness.     Mouth/Throat:     Mouth: Mucous membranes are moist.     Pharynx: Uvula midline. Posterior oropharyngeal erythema (mild cobblestone appearance) present. No pharyngeal swelling or oropharyngeal exudate.  Eyes:     General: Lids are normal.        Right eye: No discharge.        Left eye: No discharge.     Extraocular Movements: Extraocular movements intact.     Conjunctiva/sclera: Conjunctivae normal.     Pupils: Pupils are equal, round, and reactive to light.     Visual Fields: Right eye visual fields  normal and left eye visual fields normal.  Neck:     Thyroid: No thyromegaly.     Vascular: No carotid bruit.     Trachea: Trachea normal.  Cardiovascular:     Rate and Rhythm: Normal rate and regular rhythm.     Heart sounds: Murmur heard.     Systolic murmur is present with a grade of 2/6.     No gallop.  Pulmonary:     Effort: Pulmonary effort is normal. No accessory muscle usage or respiratory distress.     Breath sounds: Normal breath sounds.  Chest:  Breasts:    Right: Normal.     Left: Normal.  Abdominal:     General: Bowel sounds are normal.  Palpations: Abdomen is soft. There is no hepatomegaly or splenomegaly.     Tenderness: There is no abdominal tenderness.  Musculoskeletal:        General: Normal range of motion.     Cervical back: Normal range of motion and neck supple.     Right lower leg: No edema.     Left lower leg: No edema.  Lymphadenopathy:     Head:     Right side of head: No submental, submandibular, tonsillar, preauricular or posterior auricular adenopathy.     Left side of head: No submental, submandibular, tonsillar, preauricular or posterior auricular adenopathy.     Cervical: No cervical adenopathy.     Upper Body:     Right upper body: No supraclavicular, axillary or pectoral adenopathy.     Left upper body: No supraclavicular, axillary or pectoral adenopathy.  Skin:    General: Skin is warm and dry.     Capillary Refill: Capillary refill takes less than 2 seconds.     Findings: No rash.  Neurological:     Mental Status: She is alert and oriented to person, place, and time.     Gait: Gait is intact.     Deep Tendon Reflexes: Reflexes are normal and symmetric.     Reflex Scores:      Brachioradialis reflexes are 2+ on the right side and 2+ on the left side.      Patellar reflexes are 2+ on the right side and 2+ on the left side. Psychiatric:        Attention and Perception: Attention normal.        Mood and Affect: Mood normal.         Speech: Speech normal.        Behavior: Behavior normal. Behavior is cooperative.        Thought Content: Thought content normal.        Judgment: Judgment normal.    Diabetic Foot Exam - Simple   Simple Foot Form Visual Inspection No deformities, no ulcerations, no other skin breakdown bilaterally: Yes Sensation Testing Intact to touch and monofilament testing bilaterally: Yes Pulse Check Posterior Tibialis and Dorsalis pulse intact bilaterally: Yes Comments     Results for orders placed or performed in visit on 02/20/22  Bayer DCA Hb A1c Waived  Result Value Ref Range   HB A1C (BAYER DCA - WAIVED) 6.9 (H) 4.8 - 5.6 %      Assessment & Plan:   Problem List Items Addressed This Visit       Cardiovascular and Mediastinum   Hypertension associated with diabetes ([place])    Chronic, stable.  BP at goal in office today.  Continue current medication regimen and adjust as needed.  Recommend checking BP at home three mornings a week and documenting for provider + focus on DASH diet.  LABS: CBC, CMP, TSH.  Refills sent in.  Return in 3 months.      Relevant Medications   metFORMIN (GLUCOPHAGE) 500 MG tablet   lisinopril (ZESTRIL) 10 MG tablet   furosemide (LASIX) 20 MG tablet   atorvastatin (LIPITOR) 10 MG tablet   amLODipine (NORVASC) 10 MG tablet   cloNIDine (CATAPRES) 0.3 MG tablet   Other Relevant Orders   Bayer DCA Hb A1c Waived (Completed)   CBC with Differential/Platelet   TSH     Endocrine   Diabetes mellitus with proteinuria (H[facility])    Chronic, ongoing with recent A1c levels below goal, today 6.9%.  Urine ALB 80  today (April 2023), continue Lisinopril for kidney protection.  Continue current medication regimen and adjust as needed, although discussed with her possibly stopping Metformin and changing to a GLP1 to assist with her weight loss goals, she is not interested in this at this time. Continue daily B12 supplement for low levels in past.  Continue Gabapentin at night.   Return in 3 months.      Relevant Medications   metFORMIN (GLUCOPHAGE) 500 MG tablet   lisinopril (ZESTRIL) 10 MG tablet   atorvastatin (LIPITOR) 10 MG tablet   Other Relevant Orders   Bayer DCA Hb A1c Waived (Completed)   Hyperlipidemia associated with type 2 diabetes mellitus ([facility])    Chronic, ongoing.  Continue current medication regimen and adjust as needed.  Lipid panel today. Return in 3 months.        Relevant Medications   metFORMIN (GLUCOPHAGE) 500 MG tablet   lisinopril (ZESTRIL) 10 MG tablet   furosemide (LASIX) 20 MG tablet   atorvastatin (LIPITOR) 10 MG tablet   amLODipine (NORVASC) 10 MG tablet   cloNIDine (CATAPRES) 0.3 MG tablet   Other Relevant Orders   Bayer DCA Hb A1c Waived (Completed)   Comprehensive metabolic panel   Lipid Panel w/o Chol/HDL Ratio   Type 2 diabetes mellitus with diabetic neuropathy, unspecified (HCC) - Primary    Chronic, ongoing with recent A1c levels below goal, today 6.9%.  Urine ALB 80 today (April 2023), continue Lisinopril for kidney protection.  Continue current medication regimen and adjust as needed, although discussed with her possibly stopping Metformin and changing to a GLP1 to assist with her weight loss goals, she is not interested in this at this time. Continue daily B12 supplement for low levels in past.  Continue Gabapentin at night.  Return in 3 months.      Relevant Medications   metFORMIN (GLUCOPHAGE) 500 MG tablet   lisinopril (ZESTRIL) 10 MG tablet   atorvastatin (LIPITOR) 10 MG tablet   Other Relevant Orders   Bayer DCA Hb A1c Waived (Completed)     Musculoskeletal and Integument   Osteopenia of neck of left femur    Noted on DEXA 01/03/21 == continue Vitamin D supplement daily.  Recheck DEXA in 2027.      Relevant Orders   VITAMIN D 25 Hydroxy (Vit-D Deficiency, Fractures)     Other   Vitamin B12 deficiency    Ongoing and stable.  Vitamin B12 checked today and continue supplement.      Relevant Orders    Vitamin B12   Heart murmur    With aortic sclerosis and moderate LVH past echo.  Asymptomatic at this time.  Consider cardiology referral if any worsening heart function.      Relevant Orders   Comprehensive metabolic panel   Lipid Panel w/o Chol/HDL Ratio   Morbid obesity (HCC)    BMI 50.30, with T2DM and HTN/HLD.  Recommended eating smaller high protein, low fat meals more frequently and exercising 30 mins a day 5 times a week with a goal of 10-15lb weight loss in the next 3 months. Patient voiced their understanding and motivation to adhere to these recommendations.       Relevant Medications   metFORMIN (GLUCOPHAGE) 500 MG tablet   Sinus pressure    Started 2 days ago.  At this time would not start abx therapy, discussed with patient.  Since diabetes remains well-controlled will send in Prednisone 40 MG daily for 5 days -- educated patient on this and sugar  levels.  Recommend: - Increased rest - Increasing Fluids - Acetaminophen as needed for fever/pain.  - Salt water gargling, chloraseptic spray and throat lozenges - OTC Coricidin and Claritin - Mucinex.  - Humidifying the air. Return if ongoing or worsening, consider addition of abx therapy if >7 days.      Vitamin D deficiency    Ongoing.  Will check vitamin D today and continue supplement.      Relevant Orders   VITAMIN D 25 Hydroxy (Vit-D Deficiency, Fractures)   Other Visit Diagnoses     Encounter for screening mammogram for malignant neoplasm of breast       Mammogram ordered today and recommend she schedule.   Relevant Orders   MM 3D SCREEN BREAST BILATERAL   Encounter for annual physical exam       Annual physical today with labs and health maintenance reviewed, discussed with patient.        Follow up plan: Return in about 3 months (around 05/23/2022) for T2DM, HTN/HLD, OBESITY.   LABORATORY TESTING:  - Pap smear: not applicable  IMMUNIZATIONS:   - Tdap: Tetanus vaccination status reviewed: last  tetanus booster within 10 years. - Influenza: Up to date -- will obtain when feeling better - Pneumovax: Up to date - Prevnar: Up to date - HPV: Not applicable - Zostavax vaccine: has not obtained, ordered in past  SCREENING: -Mammogram: Ordered today, last 01/03/21 - Colonoscopy: Up to date  - Bone Density: Up to date  -Hearing Test: Not applicable  -Spirometry: Not applicable   PATIENT COUNSELING:   Advised to take 1 mg of folate supplement per day if capable of pregnancy.   Sexuality: Discussed sexually transmitted diseases, partner selection, use of condoms, avoidance of unintended pregnancy  and contraceptive alternatives.   Advised to avoid cigarette smoking.  I discussed with the patient that most people either abstain from alcohol or drink within safe limits (<=14/week and <=4 drinks/occasion for males, <=7/weeks and <= 3 drinks/occasion for females) and that the risk for alcohol disorders and other health effects rises proportionally with the number of drinks per week and how often a drinker exceeds daily limits.  Discussed cessation/primary prevention of drug use and availability of treatment for abuse.   Diet: Encouraged to adjust caloric intake to maintain  or achieve ideal body weight, to reduce intake of dietary saturated fat and total fat, to limit sodium intake by avoiding high sodium foods and not adding table salt, and to maintain adequate dietary potassium and calcium preferably from fresh fruits, vegetables, and low-fat dairy products.    Stressed the importance of regular exercise  Injury prevention: Discussed safety belts, safety helmets, smoke detector, smoking near bedding or upholstery.   Dental health: Discussed importance of regular tooth brushing, flossing, and dental visits.    NEXT PREVENTATIVE PHYSICAL DUE IN 1 YEAR. Return in about 3 months (around 05/23/2022) for T2DM, HTN/HLD, OBESITY.

## 2022-02-20 NOTE — Assessment & Plan Note (Signed)
Chronic, ongoing with recent A1c levels below goal, today 6.9%.  Urine ALB 80 today (April 2023), continue Lisinopril for kidney protection.  Continue current medication regimen and adjust as needed, although discussed with her possibly stopping Metformin and changing to a GLP1 to assist with her weight loss goals, she is not interested in this at this time. Continue daily B12 supplement for low levels in past.  Continue Gabapentin at night.  Return in 3 months.

## 2022-02-20 NOTE — Assessment & Plan Note (Signed)
Ongoing and stable.  Vitamin B12 checked today and continue supplement.

## 2022-02-20 NOTE — Assessment & Plan Note (Signed)
With aortic sclerosis and moderate LVH past echo.  Asymptomatic at this time.  Consider cardiology referral if any worsening heart function.

## 2022-02-20 NOTE — Assessment & Plan Note (Signed)
Chronic, stable.  BP at goal in office today.  Continue current medication regimen and adjust as needed.  Recommend checking BP at home three mornings a week and documenting for provider + focus on DASH diet.  LABS: CBC, CMP, TSH.  Refills sent in.  Return in 3 months.

## 2022-02-20 NOTE — Assessment & Plan Note (Signed)
Ongoing.  Will check vitamin D today and continue supplement.

## 2022-02-20 NOTE — Assessment & Plan Note (Signed)
Noted on DEXA 01/03/21 == continue Vitamin D supplement daily.  Recheck DEXA in 2027.

## 2022-02-20 NOTE — Assessment & Plan Note (Signed)
BMI 50.30, with T2DM and HTN/HLD.  Recommended eating smaller high protein, low fat meals more frequently and exercising 30 mins a day 5 times a week with a goal of 10-15lb weight loss in the next 3 months. Patient voiced their understanding and motivation to adhere to these recommendations.

## 2022-02-20 NOTE — Assessment & Plan Note (Signed)
Chronic, ongoing.  Continue current medication regimen and adjust as needed.  Lipid panel today. Return in 3 months.

## 2022-02-21 ENCOUNTER — Other Ambulatory Visit: Payer: Self-pay | Admitting: Nurse Practitioner

## 2022-02-21 DIAGNOSIS — D649 Anemia, unspecified: Secondary | ICD-10-CM

## 2022-02-21 LAB — COMPREHENSIVE METABOLIC PANEL
ALT: 11 IU/L (ref 0–32)
AST: 11 IU/L (ref 0–40)
Albumin/Globulin Ratio: 1.6 (ref 1.2–2.2)
Albumin: 4.3 g/dL (ref 3.9–4.9)
Alkaline Phosphatase: 94 IU/L (ref 44–121)
BUN/Creatinine Ratio: 13 (ref 12–28)
BUN: 11 mg/dL (ref 8–27)
Bilirubin Total: 0.4 mg/dL (ref 0.0–1.2)
CO2: 21 mmol/L (ref 20–29)
Calcium: 9.3 mg/dL (ref 8.7–10.3)
Chloride: 103 mmol/L (ref 96–106)
Creatinine, Ser: 0.82 mg/dL (ref 0.57–1.00)
Globulin, Total: 2.7 g/dL (ref 1.5–4.5)
Glucose: 127 mg/dL — ABNORMAL HIGH (ref 70–99)
Potassium: 3.9 mmol/L (ref 3.5–5.2)
Sodium: 141 mmol/L (ref 134–144)
Total Protein: 7 g/dL (ref 6.0–8.5)
eGFR: 78 mL/min/{1.73_m2} (ref >59)

## 2022-02-21 LAB — CBC WITH DIFFERENTIAL/PLATELET
Basophils Absolute: 0.1 10*3/uL (ref 0.0–0.2)
Basos: 1 %
EOS (ABSOLUTE): 0.5 10*3/uL — ABNORMAL HIGH (ref 0.0–0.4)
Eos: 5 %
Hematocrit: 32.3 % — ABNORMAL LOW (ref 34.0–46.6)
Hemoglobin: 10.8 g/dL — ABNORMAL LOW (ref 11.1–15.9)
Immature Grans (Abs): 0 10*3/uL (ref 0.0–0.1)
Immature Granulocytes: 0 %
Lymphocytes Absolute: 1.6 10*3/uL (ref 0.7–3.1)
Lymphs: 17 %
MCH: 27 pg (ref 26.6–33.0)
MCHC: 33.4 g/dL (ref 31.5–35.7)
MCV: 81 fL (ref 79–97)
Monocytes Absolute: 0.9 10*3/uL (ref 0.1–0.9)
Monocytes: 9 %
Neutrophils Absolute: 6.5 10*3/uL (ref 1.4–7.0)
Neutrophils: 68 %
Platelets: 401 10*3/uL (ref 150–450)
RBC: 4 x10E6/uL (ref 3.77–5.28)
RDW: 13.3 % (ref 11.7–15.4)
WBC: 9.5 10*3/uL (ref 3.4–10.8)

## 2022-02-21 LAB — LIPID PANEL W/O CHOL/HDL RATIO
Cholesterol, Total: 111 mg/dL (ref 100–199)
HDL: 51 mg/dL (ref >39)
LDL Chol Calc (NIH): 48 mg/dL (ref 0–99)
Triglycerides: 51 mg/dL (ref 0–149)
VLDL Cholesterol Cal: 12 mg/dL (ref 5–40)

## 2022-02-21 LAB — TSH: TSH: 1.02 u[IU]/mL (ref 0.450–4.500)

## 2022-02-21 LAB — VITAMIN B12: Vitamin B-12: >2000 pg/mL — ABNORMAL HIGH (ref 232–1245)

## 2022-02-21 LAB — VITAMIN D 25 HYDROXY (VIT D DEFICIENCY, FRACTURES): Vit D, 25-Hydroxy: 32.8 ng/mL (ref 30.0–100.0)

## 2022-02-21 NOTE — Progress Notes (Signed)
Contacted via [facility] [place] -- need lab only visit in 4 weeks please Good afternoon [place], your labs have returned and overall are stable with exception of a couple things: - B12 level a bit too high, reduce your B12 supplement to 3 times a week and not every day. - CBC shows low hemoglobin and hematocrit, which is new for you and can be sign of anemia.  I would like to recheck this in 4 weeks on outpatient labs only and check your iron level.  Have you had any blood in stool recently?  Any questions? Keep being stellar!!  Thank you for allowing me to participate in your care.  I appreciate you. Kindest regards, Laporscha Linehan

## 2022-02-27 ENCOUNTER — Ambulatory Visit: Payer: Self-pay | Admitting: *Deleted

## 2022-02-27 MED ORDER — AMOXICILLIN-POT CLAVULANATE 875-125 MG PO TABS: 1.0000 | ORAL_TABLET | Freq: Two times a day (BID) | ORAL | 0 refills | Status: AC

## 2022-02-27 NOTE — Telephone Encounter (Signed)
  Chief Complaint: cough- medication request Symptoms: mostly non productive cough- keeping patient up at night, yellow sputum Frequency: 1 week Pertinent Negatives: Patient denies fever Disposition: ED /'[]'$ Urgent Care (no appt availability in office) / '[]'$ Appointment(In office/virtual)/ '[]'$  [facility]/ '[]'$ Home Care/ '[]'$ Refused Recommended Disposition /'[]'$ [facility]/ '[x]'$  Follow-up with PCP Additional Notes: Patient was treated 11/1 with prednisone for cough. Patient reports it helped a little- but she still has cough keeping her up at night. Visit states patient may need antibiotic if not better- she is requesting consideration of new medication to help with her cough.    Reason for Disposition  [1] Continuous (nonstop) coughing interferes with work or school AND [2] no improvement using cough treatment per Care Advice  Answer Assessment - Initial Assessment Questions 1. ONSET: "When did the cough begin?"      1 week- prednisone helped- not much 2. SEVERITY: "How bad is the cough today?"      Keeping up at night 3. SPUTUM: "Describe the color of your sputum" (none, dry cough; clear, white, yellow, green)     Yellow, nasal discharge has a little blood in it 4. HEMOPTYSIS: "Are you coughing up any blood?" If so ask: "How much?" (flecks, streaks, tablespoons, etc.)     Nasal congestion only- flecks 5. DIFFICULTY BREATHING: "Are you having difficulty breathing?" If Yes, ask: "How bad is it?" (e.g., mild, moderate, severe)    - MILD: No SOB at rest, mild SOB with walking, speaks normally in sentences, can lie down, no retractions, pulse < 100.    - MODERATE: SOB at rest, SOB with minimal exertion and prefers to sit, cannot lie down flat, speaks in phrases, mild retractions, audible wheezing, pulse 100-120.    - SEVERE: Very SOB at rest, speaks in single words, struggling to breathe, sitting hunched forward, retractions, pulse > 120      Patient is using inhaler at night- using  more than normal this week 6. FEVER: "Do you have a fever?" If Yes, ask: "What is your temperature, how was it measured, and when did it start?"     no 7. CARDIAC HISTORY: "Do you have any history of heart disease?" (e.g., heart attack, congestive heart failure)      Heart disease 8. LUNG HISTORY: "Do you have any history of lung disease?"  (e.g., pulmonary embolus, asthma, emphysema)     no 9. PE RISK FACTORS: "Do you have a history of blood clots?" (or: recent major surgery, recent prolonged travel, bedridden)     no 10. OTHER SYMPTOMS: "Do you have any other symptoms?" (e.g., runny nose, wheezing, chest pain)       Runny nose  Protocols used: Cough - Acute Non-Productive-A-AH

## 2022-02-27 NOTE — Addendum Note (Signed)
Addended by: Marnee Guarneri T on: 02/27/2022 05:06 PM   Modules accepted: Orders

## 2022-02-27 NOTE — Telephone Encounter (Signed)
Summary: cough   Patient called in saw Dr Ned Card  says she is out of other med given form Nov 1 appt has cough now and asking for med to be sent to pharmacy, instead of making another appt. [place]), [place] [address] [place] Phone: [phone] Fax: [phone]    Attempted to call patient- no answer

## 2022-03-22 ENCOUNTER — Other Ambulatory Visit: Payer: Medicare HMO

## 2022-03-22 DIAGNOSIS — D649 Anemia, unspecified: Secondary | ICD-10-CM | POA: Diagnosis not present

## 2022-03-23 LAB — IRON AND TIBC
Iron Saturation: 30 % (ref 15–55)
Iron: 92 ug/dL (ref 27–139)
Total Iron Binding Capacity: 305 ug/dL (ref 250–450)
UIBC: 213 ug/dL (ref 118–369)

## 2022-03-23 LAB — CBC WITH DIFFERENTIAL/PLATELET
Basophils Absolute: 0 10*3/uL (ref 0.0–0.2)
Basos: 1 %
EOS (ABSOLUTE): 0.4 10*3/uL (ref 0.0–0.4)
Eos: 6 %
Hematocrit: 34.5 % (ref 34.0–46.6)
Hemoglobin: 11 g/dL — ABNORMAL LOW (ref 11.1–15.9)
Immature Grans (Abs): 0 10*3/uL (ref 0.0–0.1)
Immature Granulocytes: 0 %
Lymphocytes Absolute: 2.1 10*3/uL (ref 0.7–3.1)
Lymphs: 32 %
MCH: 26.5 pg — ABNORMAL LOW (ref 26.6–33.0)
MCHC: 31.9 g/dL (ref 31.5–35.7)
MCV: 83 fL (ref 79–97)
Monocytes Absolute: 0.5 10*3/uL (ref 0.1–0.9)
Monocytes: 8 %
Neutrophils Absolute: 3.4 10*3/uL (ref 1.4–7.0)
Neutrophils: 53 %
Platelets: 464 10*3/uL — ABNORMAL HIGH (ref 150–450)
RBC: 4.15 x10E6/uL (ref 3.77–5.28)
RDW: 13.7 % (ref 11.7–15.4)
WBC: 6.4 10*3/uL (ref 3.4–10.8)

## 2022-03-23 LAB — FERRITIN: Ferritin: 60 ng/mL (ref 15–150)

## 2022-03-24 NOTE — Progress Notes (Signed)
Contacted via MyChart   Good morning Jillian Pierce, your labs have returned and anemia is improving some this check.  I recommend taking multivitamin daily.  We will see what stool sample says.  Iron level is normal.  Any questions? Keep being awesome!!  Thank you for allowing me to participate in your care.  I appreciate you. Kindest regards, Vandy Tsuchiya

## 2022-04-18 ENCOUNTER — Encounter: Payer: Self-pay | Admitting: Physician Assistant

## 2022-04-18 ENCOUNTER — Ambulatory Visit (INDEPENDENT_AMBULATORY_CARE_PROVIDER_SITE_OTHER): Payer: Medicare HMO | Admitting: Physician Assistant

## 2022-04-18 VITALS — BP 125/62 | HR 71 | Temp 98.3°F | Wt 252.3 lb

## 2022-04-18 DIAGNOSIS — J101 Influenza due to other identified influenza virus with other respiratory manifestations: Secondary | ICD-10-CM | POA: Diagnosis not present

## 2022-04-18 DIAGNOSIS — D649 Anemia, unspecified: Secondary | ICD-10-CM | POA: Diagnosis not present

## 2022-04-18 DIAGNOSIS — R051 Acute cough: Secondary | ICD-10-CM | POA: Diagnosis not present

## 2022-04-18 LAB — VERITOR FLU A/B WAIVED
Influenza A: POSITIVE — AB
Influenza B: NEGATIVE

## 2022-04-18 MED ORDER — OSELTAMIVIR PHOSPHATE 75 MG PO CAPS: 75.0000 mg | ORAL_CAPSULE | Freq: Two times a day (BID) | ORAL | 0 refills | Status: DC

## 2022-04-18 MED ORDER — BENZONATATE 200 MG PO CAPS: 200.0000 mg | ORAL_CAPSULE | Freq: Two times a day (BID) | ORAL | 0 refills | Status: DC | PRN

## 2022-04-18 NOTE — Patient Instructions (Addendum)
You have tested positive for Flu A  I have sent in a script for tamiflu for you to take twice per day for 5 days   Symptoms can last for 3-10 days with lingering cough and intermittent symptoms lasting weeks after that.  The goal of treatment at this time is to reduce your symptoms and discomfort   I have sent in Tessalon pearls for you to take twice per day to help with your cough  I recommend using Mucinex to help with the congestion and mucus production- make sure you are drinking plenty of water to help thin it out    If your symptoms do not improve or become worse in the next 5-7 days please make an apt at the office so we can see you  Go to the ER if you begin to have more serious symptoms such as shortness of breath, trouble breathing, loss of consciousness, swelling around the eyes, high fever, severe lasting headaches, vision changes or neck pain/stiffness.

## 2022-04-18 NOTE — Progress Notes (Signed)
Acute Office Visit   Patient: Jillian Pierce   DOB: 12-07-1955   66 y.o. Female  MRN: [id number] Visit Date: 04/18/2022  Today's healthcare provider: Dani Gobble Shaniqwa Horsman, PA-C  Introduced myself to the patient as a Journalist, newspaper and provided education on APPs in clinical practice.    Chief Complaint  Patient presents with   URI    Pt states she has been having a cough and congestion for the last 3 to 4 days. States she has been taking tylenol OTC.    Subjective    URI  Associated symptoms include congestion, coughing, diarrhea, headaches and wheezing. Pertinent negatives include no nausea, sore throat or vomiting.   HPI     URI    Additional comments: Pt states she has been having a cough and congestion for the last 3 to 4 days. States she has been taking tylenol OTC.       Last edited by Georgina Peer, CMA on 04/18/2022 10:06 AM.      Prescilla Sours symptoms  Onset: sudden Duration: Since Sunday  Associated Symptoms: sinus congestion, cough, chills, lower back pain  Isolated diarrhea on Tuesday - has cleared up  Fever: no Productive cough: no  Recent sick contacts: no not to her knowledge  COVID testing at home:  yes tested on Tuesday   Results: negative  Interventions: Tylenol, cough syrup  She has tried using her rescue inhaler a couple times - provided mild relief from coughing   Medications: Outpatient Medications Prior to Visit  Medication Sig   albuterol (VENTOLIN HFA) 108 (90 Base) MCG/ACT inhaler Inhale 2 puffs into the lungs every 6 (six) hours as needed for wheezing or shortness of breath.   amLODipine (NORVASC) 10 MG tablet Take 1 tablet (10 mg total) by mouth daily.   Ascorbic Acid (VITAMIN C PO) Take by mouth daily.   aspirin EC 81 MG tablet Take 81 mg by mouth daily.   atorvastatin (LIPITOR) 10 MG tablet Take 1 tablet (10 mg total) by mouth daily.   Blood Glucose Monitoring Suppl (TRUE METRIX AIR GLUCOSE METER) w/Device KIT Use to check blood sugar 1-3  times a day.   cloNIDine (CATAPRES) 0.3 MG tablet Take 1 tablet (0.3 mg total) by mouth 2 (two) times daily.   Cyanocobalamin (VITAMIN B 12 PO) Take by mouth daily.   fluticasone (FLONASE) 50 MCG/ACT nasal spray Place 2 sprays into both nostrils daily.   furosemide (LASIX) 20 MG tablet Take 1 tablet (20 mg total) by mouth daily.   gabapentin (NEURONTIN) 600 MG tablet Take 0.5 tablets (300 mg total) by mouth at bedtime.   glucose blood (TRUE METRIX BLOOD GLUCOSE TEST) test strip Use to check blood sugar 1-2 times daily.   lisinopril (ZESTRIL) 10 MG tablet Take 1 tablet (10 mg total) by mouth daily.   meloxicam (MOBIC) 15 MG tablet Take 15 mg by mouth daily. As needed   metFORMIN (GLUCOPHAGE) 500 MG tablet TAKE ONE-HALF TABLET BY MOUTH TWICE DAILY   TRUEplus Lancets 33G MISC Use to check blood sugar 1-3 times daily.   VITAMIN D PO Take by mouth daily.   No facility-administered medications prior to visit.    Review of Systems  Constitutional:  Positive for chills and fatigue. Negative for fever.  HENT:  Positive for congestion, postnasal drip and sinus pressure. Negative for sore throat.   Respiratory:  Positive for cough and wheezing. Negative for chest tightness and shortness of breath.  Gastrointestinal:  Positive for diarrhea. Negative for nausea and vomiting.  Musculoskeletal:  Positive for myalgias.  Neurological:  Positive for light-headedness and headaches. Negative for dizziness.       Objective    BP 125/62   Pulse 71   Temp 98.3 F (36.8 C) (Oral)   Wt 252 lb 4.8 oz (114.4 kg)   SpO2 95%   BMI 48.87 kg/m    Physical Exam Vitals reviewed.  Constitutional:      General: She is awake.     Appearance: Normal appearance. She is well-developed and well-groomed.  HENT:     Head: Normocephalic and atraumatic.     Mouth/Throat:     Lips: Pink.     Mouth: Mucous membranes are moist.     Pharynx: Oropharynx is clear. Uvula midline. No oropharyngeal exudate or posterior  oropharyngeal erythema.  Eyes:     General: Lids are normal. Gaze aligned appropriately. No scleral icterus.    Extraocular Movements: Extraocular movements intact.     Conjunctiva/sclera: Conjunctivae normal.     Right eye: Right conjunctiva is not injected. No chemosis or exudate.    Left eye: Left conjunctiva is not injected. No chemosis or exudate. Cardiovascular:     Rate and Rhythm: Normal rate and regular rhythm.     Heart sounds: Heart sounds are distant.  Pulmonary:     Effort: Pulmonary effort is normal.     Breath sounds: Normal breath sounds. No decreased air movement. No decreased breath sounds, wheezing, rhonchi or rales.  Skin:    General: Skin is warm and dry.  Neurological:     General: No focal deficit present.     Mental Status: She is alert and oriented to person, place, and time.  Psychiatric:        Attention and Perception: Attention and perception normal.        Mood and Affect: Mood normal. Affect is blunt.        Speech: Speech normal.        Behavior: Behavior normal. Behavior is cooperative.        Thought Content: Thought content normal.        Judgment: Judgment normal.       Results for orders placed or performed in visit on 04/18/22  Veritor Flu A/B Waived  Result Value Ref Range   Influenza A Positive (A) Negative   Influenza B Negative Negative    Assessment & Plan      No follow-ups on file.      Problem List Items Addressed This Visit   None Visit Diagnoses     Influenza A    -  Primary Acute, new concern  Reports she has been feeling sick with congestion, dry cough, body aches, headaches since Sunday Reviewed Positive Flu A results with her during apt and recommend that she start Tamiflu despite being outside of recommended therapeutic window given her chronic health conditions Tamiflu 75 mg PO BID x 5 days sent in  along with Tessalon pearls for cough Recommend she use OTC medications for further symptomatic relief and reviewed  using Albuterol inhaler for SOB and severe coughing fits Follow up as needed for persistent or progressing symptoms     Relevant Medications   oseltamivir (TAMIFLU) 75 MG capsule   Acute cough       Relevant Medications   benzonatate (TESSALON) 200 MG capsule   Other Relevant Orders   Veritor Flu A/B Waived (Completed)   Novel Coronavirus, NAA (  [facility])        No follow-ups on file.   I, Daniele Yankowski E Yazan Gatling, PA-C, have reviewed all documentation for this visit. The documentation on 04/18/22 for the exam, diagnosis, procedures, and orders are all accurate and complete.   Talitha Givens, MHS, PA-C [facility]

## 2022-04-20 LAB — NOVEL CORONAVIRUS, NAA: SARS-CoV-2, NAA: NOT DETECTED

## 2022-04-20 LAB — FECAL OCCULT BLOOD, IMMUNOCHEMICAL: Fecal Occult Bld: NEGATIVE

## 2022-04-20 NOTE — Progress Notes (Signed)
Contacted via MyChart   Good morning Ferrin, your stool shows no blood.  Great news!!

## 2022-05-05 ENCOUNTER — Emergency Department: Payer: Medicare PPO

## 2022-05-05 ENCOUNTER — Emergency Department
Admission: EM | Admit: 2022-05-05 | Discharge: 2022-05-05 | Disposition: A | Discharge status: Home / Self Care | Payer: Medicare PPO | Attending: Emergency Medicine | Admitting: Emergency Medicine

## 2022-05-05 ENCOUNTER — Other Ambulatory Visit: Payer: Self-pay

## 2022-05-05 DIAGNOSIS — J189 Pneumonia, unspecified organism: Secondary | ICD-10-CM | POA: Diagnosis not present

## 2022-05-05 DIAGNOSIS — R0602 Shortness of breath: Secondary | ICD-10-CM | POA: Insufficient documentation

## 2022-05-05 DIAGNOSIS — J168 Pneumonia due to other specified infectious organisms: Secondary | ICD-10-CM | POA: Diagnosis not present

## 2022-05-05 DIAGNOSIS — R059 Cough, unspecified: Secondary | ICD-10-CM | POA: Diagnosis not present

## 2022-05-05 LAB — CBC WITH DIFFERENTIAL/PLATELET
Abs Immature Granulocytes: 0.02 10*3/uL (ref 0.00–0.07)
Basophils Absolute: 0 10*3/uL (ref 0.0–0.1)
Basophils Relative: 0 %
Eosinophils Absolute: 0.1 10*3/uL (ref 0.0–0.5)
Eosinophils Relative: 3 %
HCT: 35 % — ABNORMAL LOW (ref 36.0–46.0)
Hemoglobin: 11.2 g/dL — ABNORMAL LOW (ref 12.0–15.0)
Immature Granulocytes: 0 %
Lymphocytes Relative: 21 %
Lymphs Abs: 1.1 10*3/uL (ref 0.7–4.0)
MCH: 26.5 pg (ref 26.0–34.0)
MCHC: 32 g/dL (ref 30.0–36.0)
MCV: 82.7 fL (ref 80.0–100.0)
Monocytes Absolute: 0.6 10*3/uL (ref 0.1–1.0)
Monocytes Relative: 12 %
Neutro Abs: 3.3 10*3/uL (ref 1.7–7.7)
Neutrophils Relative %: 64 %
Platelets: 442 10*3/uL — ABNORMAL HIGH (ref 150–400)
RBC: 4.23 MIL/uL (ref 3.87–5.11)
RDW: 14.8 % (ref 11.5–15.5)
WBC: 5.1 10*3/uL (ref 4.0–10.5)
nRBC: 0 % (ref 0.0–0.2)

## 2022-05-05 LAB — COMPREHENSIVE METABOLIC PANEL
ALT: 15 U/L (ref 0–44)
AST: 22 U/L (ref 15–41)
Albumin: 4 g/dL (ref 3.5–5.0)
Alkaline Phosphatase: 71 U/L (ref 38–126)
Anion gap: 10 (ref 5–15)
BUN: 9 mg/dL (ref 8–23)
CO2: 24 mmol/L (ref 22–32)
Calcium: 8.9 mg/dL (ref 8.9–10.3)
Chloride: 103 mmol/L (ref 98–111)
Creatinine, Ser: 0.73 mg/dL (ref 0.44–1.00)
GFR, Estimated: >60 mL/min (ref >60)
Glucose, Bld: 115 mg/dL — ABNORMAL HIGH (ref 70–99)
Potassium: 3.4 mmol/L — ABNORMAL LOW (ref 3.5–5.1)
Sodium: 137 mmol/L (ref 135–145)
Total Bilirubin: 0.8 mg/dL (ref 0.3–1.2)
Total Protein: 7.8 g/dL (ref 6.5–8.1)

## 2022-05-05 LAB — BRAIN NATRIURETIC PEPTIDE: B Natriuretic Peptide: 37.4 pg/mL (ref 0.0–100.0)

## 2022-05-05 LAB — TROPONIN I (HIGH SENSITIVITY)
Troponin I (High Sensitivity): 7 ng/L (ref <18)
Troponin I (High Sensitivity): 7 ng/L (ref <18)

## 2022-05-05 MED ORDER — FUROSEMIDE 10 MG/ML IJ SOLN
40.0000 mg | Freq: Once | INTRAMUSCULAR | Status: AC
  Administered 2022-05-05: 40 mg via INTRAVENOUS
  Filled 2022-05-05: qty 4

## 2022-05-05 MED ORDER — SODIUM CHLORIDE 0.9 % IV SOLN
1.0000 g | Freq: Once | INTRAVENOUS | Status: AC
  Administered 2022-05-05: 1 g via INTRAVENOUS
  Filled 2022-05-05: qty 10

## 2022-05-05 MED ORDER — AZITHROMYCIN 250 MG PO TABS: ORAL_TABLET | ORAL | 0 refills | Status: AC

## 2022-05-05 MED ORDER — AMOXICILLIN-POT CLAVULANATE 875-125 MG PO TABS: 1.0000 | ORAL_TABLET | Freq: Two times a day (BID) | ORAL | 0 refills | Status: AC

## 2022-05-05 NOTE — Discharge Instructions (Signed)
Take Augmentin twice daily for the next 7 days. Take 2 tablets of azithromycin the first day.  Take 1 tablet on days 2 through 5. You can continue using cough medication as prescribed by your primary care provider.

## 2022-05-05 NOTE — ED Provider Notes (Signed)
[facility] Provider Note  Patient Contact: 4:18 PM (approximate)   History   Cough   HPI  Jillian Pierce is a 67 y.o. female presents to the emergency department with cough for the past several weeks.  Patient states that she has noticed some lower extremity swelling and difficulty sleeping at night.  She states that her primary care provider called her in some cough medicine does not relieve her symptoms.  Patient is taking 20 mg of Lasix at night.  She denies chest pain or chest tightness.  No nausea or vomiting.      Physical Exam   Triage Vital Signs: ED Triage Vitals  Enc Vitals Group     BP 05/05/22 1418 (!) 175/120     Pulse Rate 05/05/22 1418 77     Resp 05/05/22 1418 18     Temp 05/05/22 1418 97.9 F (36.6 C)     Temp Source 05/05/22 1418 Oral     SpO2 05/05/22 1418 96 %     Weight 05/05/22 1420 250 lb (113.4 kg)     Height 05/05/22 1420 '5\' 2"'$  (1.575 m)     Head Circumference --      Peak Flow --      Pain Score 05/05/22 1419 6     Pain Loc --      Pain Edu? --      Excl. in [place]? --     Most recent vital signs: Vitals:   05/05/22 1418  BP: (!) 175/120  Pulse: 77  Resp: 18  Temp: 97.9 F (36.6 C)  SpO2: 96%     General: Alert and in no acute distress. Eyes:  PERRL. EOMI. Head: No acute traumatic findings ENT:      Nose: No congestion/rhinnorhea.      Mouth/Throat: Mucous membranes are moist. Neck: No stridor. No cervical spine tenderness to palpation. Cardiovascular:  Good peripheral perfusion Respiratory: Normal respiratory effort without tachypnea or retractions. Lungs CTAB. Good air entry to the bases with no decreased or absent breath sounds. Gastrointestinal: Bowel sounds 4 quadrants. Soft and nontender to palpation. No guarding or rigidity. No palpable masses. No distention. No CVA tenderness. Musculoskeletal: Full range of motion to all extremities.  Neurologic:  No gross focal neurologic deficits are appreciated.   Skin: Patient has pitting edema bilaterally. Other:   ED Results / Procedures / Treatments   Labs (all labs ordered are listed, but only abnormal results are displayed) Labs Reviewed  CBC WITH DIFFERENTIAL/PLATELET - Abnormal; Notable for the following components:      Result Value   Hemoglobin 11.2 (*)    HCT 35.0 (*)    Platelets 442 (*)    All other components within normal limits  COMPREHENSIVE METABOLIC PANEL - Abnormal; Notable for the following components:   Potassium 3.4 (*)    Glucose, Bld 115 (*)    All other components within normal limits  BRAIN NATRIURETIC PEPTIDE  TROPONIN I (HIGH SENSITIVITY)  TROPONIN I (HIGH SENSITIVITY)        RADIOLOGY  I personally viewed and evaluated these images as part of my medical decision making, as well as reviewing the written report by the radiologist.  ED Provider Interpretation: Subtle opacities in the right midlung on the left lung base on chest x-ray   PROCEDURES:  Critical Care performed: No  Procedures   MEDICATIONS ORDERED IN ED: Medications  cefTRIAXone (ROCEPHIN) 1 g in sodium chloride 0.9 % 100 mL IVPB (0 g  Intravenous Stopped 05/05/22 1829)  furosemide (LASIX) injection 40 mg (40 mg Intravenous Given 05/05/22 1829)     IMPRESSION / MDM / ASSESSMENT AND PLAN / ED COURSE  I reviewed the triage vital signs and the nursing notes.                              Assessment and plan:  Cough:  67 year old female with a history of CHF, presents to the emergency department with bilateral lower extremity swelling and cough that is keeping her up at night.  Patient was hypertensive at triage but vital signs were otherwise reassuring.  On exam, patient alert and nontoxic-appearing with no increased work of breathing.  Does have crackles auscultated.  Will obtain labs and chest x-ray and will reassess.   Chest x-ray indicates subtle opacities in the right midlung in the left lung base.  CBC, CMP, troponin and BNP  within range.  Patient was given 40 of IV Lasix in the emergency department and IV Rocephin.  I recommended Augmentin and azithromycin at discharge for likely postviral pneumonia.  Recommended continuing Lasix as prescribed by her primary care provider patient has cough medicine prescribed by her primary care provider and recommended continuing these as well.  Return precautions were given to return with new or worsening symptoms.  All patient questions were answered.   FINAL CLINICAL IMPRESSION(S) / ED DIAGNOSES   Final diagnoses:  Pneumonia due to infectious organism, unspecified laterality, unspecified part of lung     Rx / DC Orders   ED Discharge Orders          Ordered    amoxicillin-clavulanate (AUGMENTIN) 875-125 MG tablet  2 times daily        05/05/22 1831    azithromycin (ZITHROMAX Z-PAK) 250 MG tablet        05/05/22 1831             Note:  This document was prepared using Dragon voice recognition software and may include unintentional dictation errors.   Vallarie Mare [place], Hershal Coria 05/05/22 1900    Blake Divine, MD 05/05/22 1919

## 2022-05-05 NOTE — ED Triage Notes (Signed)
Pt states being diagnosed with the flu several weeks ago, but that the cough has not gone away yet. Pt states she was given medications for it, but does not recall what they were. Pt states they have not  helped her though.

## 2022-05-19 DIAGNOSIS — J189 Pneumonia, unspecified organism: Secondary | ICD-10-CM | POA: Insufficient documentation

## 2022-05-19 NOTE — Patient Instructions (Incomplete)
Diabetes Mellitus Basics  Diabetes mellitus, or diabetes, is a long-term (chronic) disease. It occurs when the body does not properly use sugar (glucose) that is released from food after you eat. Diabetes mellitus may be caused by one or both of these problems: Your pancreas does not make enough of a hormone called insulin. Your body does not react in a normal way to the insulin that it makes. Insulin lets glucose enter cells in your body. This gives you energy. If you have diabetes, glucose cannot get into cells. This causes high blood glucose (hyperglycemia). How to treat and manage diabetes You may need to take insulin or other diabetes medicines daily to keep your glucose in balance. If you are prescribed insulin, you will learn how to give yourself insulin by injection. You may need to adjust the amount of insulin you take based on the foods that you eat. You will need to check your blood glucose levels using a glucose monitor as told by your health care provider. The readings can help determine if you have low or high blood glucose. Generally, you should have these blood glucose levels: Before meals (preprandial): 80-130 mg/dL (4.4-7.2 mmol/L). After meals (postprandial): below 180 mg/dL (10 mmol/L). Hemoglobin A1c (HbA1c) level: less than 7%. Your health care provider will set treatment goals for you. Keep all follow-up visits. This is important. Follow these instructions at home: Diabetes medicines Take your diabetes medicines every day as told by your health care provider. List your diabetes medicines here: Name of medicine: ______________________________ Amount (dose): _______________ Time (a.m./p.m.): _______________ Notes: ___________________________________ Name of medicine: ______________________________ Amount (dose): _______________ Time (a.m./p.m.): _______________ Notes: ___________________________________ Name of medicine: ______________________________ Amount (dose):  _______________ Time (a.m./p.m.): _______________ Notes: ___________________________________ Insulin If you use insulin, list the types of insulin you use here: Insulin type: ______________________________ Amount (dose): _______________ Time (a.m./p.m.): _______________Notes: ___________________________________ Insulin type: ______________________________ Amount (dose): _______________ Time (a.m./p.m.): _______________ Notes: ___________________________________ Insulin type: ______________________________ Amount (dose): _______________ Time (a.m./p.m.): _______________ Notes: ___________________________________ Insulin type: ______________________________ Amount (dose): _______________ Time (a.m./p.m.): _______________ Notes: ___________________________________ Insulin type: ______________________________ Amount (dose): _______________ Time (a.m./p.m.): _______________ Notes: ___________________________________ Managing blood glucose  Check your blood glucose levels using a glucose monitor as told by your health care provider. Write down the times that you check your glucose levels here: Time: _______________ Notes: ___________________________________ Time: _______________ Notes: ___________________________________ Time: _______________ Notes: ___________________________________ Time: _______________ Notes: ___________________________________ Time: _______________ Notes: ___________________________________ Time: _______________ Notes: ___________________________________  Low blood glucose Low blood glucose (hypoglycemia) is when glucose is at or below 70 mg/dL (3.9 mmol/L). Symptoms may include: Feeling: Hungry. Sweaty and clammy. Irritable or easily upset. Dizzy. Sleepy. Having: A fast heartbeat. A headache. A change in your vision. Numbness around the mouth, lips, or tongue. Having trouble with: Moving (coordination). Sleeping. Treating low blood glucose To treat low blood  glucose, eat or drink something containing sugar right away. If you can think clearly and swallow safely, follow the 15:15 rule: Take 15 grams of a fast-acting carb (carbohydrate), as told by your health care provider. Some fast-acting carbs are: Glucose tablets: take 3-4 tablets. Hard candy: eat 3-5 pieces. Fruit juice: drink 4 oz (120 mL). Regular (not diet) soda: drink 4-6 oz (120-180 mL). Honey or sugar: eat 1 Tbsp (15 mL). Check your blood glucose levels 15 minutes after you take the carb. If your glucose is still at or below 70 mg/dL (3.9 mmol/L), take 15 grams of a carb again. If your glucose does not go above 70 mg/dL (3.9 mmol/L) after   3 tries, get help right away. After your glucose goes back to normal, eat a meal or a snack within 1 hour. Treating very low blood glucose If your glucose is at or below 54 mg/dL (3 mmol/L), you have very low blood glucose (severe hypoglycemia). This is an emergency. Do not wait to see if the symptoms will go away. Get medical help right away. Call your local emergency services (911 in the [place] Do not drive yourself to the hospital. Questions to ask your health care provider Should I talk with a diabetes educator? What equipment will I need to care for myself at home? What diabetes medicines do I need? When should I take them? How often do I need to check my blood glucose levels? What number can I call if I have questions? When is my follow-up visit? Where can I find a support group for people with diabetes? Where to find more information American Diabetes Association: www.diabetes.org Association of Diabetes Care and Education Specialists: www.diabeteseducator.org Contact a health care provider if: Your blood glucose is at or above 240 mg/dL (13.3 mmol/L) for 2 days in a row. You have been sick or have had a fever for 2 days or more, and you are not getting better. You have any of these problems for more than 6 hours: You cannot eat or  drink. You feel nauseous. You vomit. You have diarrhea. Get help right away if: Your blood glucose is lower than 54 mg/dL (3 mmol/L). You get confused. You have trouble thinking clearly. You have trouble breathing. These symptoms may represent a serious problem that is an emergency. Do not wait to see if the symptoms will go away. Get medical help right away. Call your local emergency services (911 in the [place] Do not drive yourself to the hospital. Summary Diabetes mellitus is a chronic disease that occurs when the body does not properly use sugar (glucose) that is released from food after you eat. Take insulin and diabetes medicines as told. Check your blood glucose every day, as often as told. Keep all follow-up visits. This is important. This information is not intended to replace advice given to you by your health care provider. Make sure you discuss any questions you have with your health care provider. Document Revised: 08/10/2019 Document Reviewed: 08/10/2019 Elsevier Patient Education  2023 [employer]  

## 2022-05-24 ENCOUNTER — Ambulatory Visit: Payer: Medicare PPO | Admitting: Nurse Practitioner

## 2022-05-24 DIAGNOSIS — E1169 Type 2 diabetes mellitus with other specified complication: Secondary | ICD-10-CM

## 2022-05-24 DIAGNOSIS — Z23 Encounter for immunization: Secondary | ICD-10-CM

## 2022-05-24 DIAGNOSIS — I152 Hypertension secondary to endocrine disorders: Secondary | ICD-10-CM

## 2022-05-24 DIAGNOSIS — J189 Pneumonia, unspecified organism: Secondary | ICD-10-CM

## 2022-05-24 DIAGNOSIS — R011 Cardiac murmur, unspecified: Secondary | ICD-10-CM

## 2022-05-24 DIAGNOSIS — E1129 Type 2 diabetes mellitus with other diabetic kidney complication: Secondary | ICD-10-CM

## 2022-05-24 DIAGNOSIS — E1140 Type 2 diabetes mellitus with diabetic neuropathy, unspecified: Secondary | ICD-10-CM

## 2022-06-02 DIAGNOSIS — J1100 Influenza due to unidentified influenza virus with unspecified type of pneumonia: Secondary | ICD-10-CM | POA: Insufficient documentation

## 2022-06-02 NOTE — Patient Instructions (Signed)

## 2022-06-04 ENCOUNTER — Ambulatory Visit (INDEPENDENT_AMBULATORY_CARE_PROVIDER_SITE_OTHER): Payer: Medicare PPO | Admitting: Nurse Practitioner

## 2022-06-04 ENCOUNTER — Encounter: Payer: Self-pay | Admitting: Nurse Practitioner

## 2022-06-04 ENCOUNTER — Ambulatory Visit
Admission: RE | Admit: 2022-06-04 | Discharge: 2022-06-04 | Disposition: A | Discharge status: Home / Self Care | Payer: Medicare PPO | Attending: Nurse Practitioner | Admitting: Nurse Practitioner

## 2022-06-04 ENCOUNTER — Ambulatory Visit
Admission: RE | Admit: 2022-06-04 | Discharge: 2022-06-04 | Disposition: A | Discharge status: Home / Self Care | Payer: Medicare PPO | Source: Ambulatory Visit | Attending: Nurse Practitioner | Admitting: Nurse Practitioner

## 2022-06-04 VITALS — BP 111/75 | HR 89 | Temp 98.5°F | Ht 60.24 in | Wt 252.4 lb

## 2022-06-04 DIAGNOSIS — J1100 Influenza due to unidentified influenza virus with unspecified type of pneumonia: Secondary | ICD-10-CM | POA: Diagnosis present

## 2022-06-04 DIAGNOSIS — M19042 Primary osteoarthritis, left hand: Secondary | ICD-10-CM

## 2022-06-04 DIAGNOSIS — E1159 Type 2 diabetes mellitus with other circulatory complications: Secondary | ICD-10-CM

## 2022-06-04 DIAGNOSIS — M19041 Primary osteoarthritis, right hand: Secondary | ICD-10-CM | POA: Insufficient documentation

## 2022-06-04 DIAGNOSIS — E785 Hyperlipidemia, unspecified: Secondary | ICD-10-CM

## 2022-06-04 DIAGNOSIS — E1140 Type 2 diabetes mellitus with diabetic neuropathy, unspecified: Secondary | ICD-10-CM

## 2022-06-04 DIAGNOSIS — R809 Proteinuria, unspecified: Secondary | ICD-10-CM

## 2022-06-04 DIAGNOSIS — E1129 Type 2 diabetes mellitus with other diabetic kidney complication: Secondary | ICD-10-CM

## 2022-06-04 DIAGNOSIS — R011 Cardiac murmur, unspecified: Secondary | ICD-10-CM

## 2022-06-04 DIAGNOSIS — E1169 Type 2 diabetes mellitus with other specified complication: Secondary | ICD-10-CM

## 2022-06-04 DIAGNOSIS — I152 Hypertension secondary to endocrine disorders: Secondary | ICD-10-CM

## 2022-06-04 MED ORDER — MELOXICAM 15 MG PO TABS: 15.0000 mg | ORAL_TABLET | Freq: Every day | ORAL | 1 refills | Status: AC | PRN

## 2022-06-04 MED ORDER — FLUTICASONE PROPIONATE 50 MCG/ACT NA SUSP: 2.0000 | Freq: Every day | NASAL | 4 refills | Status: DC

## 2022-06-04 NOTE — Progress Notes (Addendum)
BP 111/75   Pulse 89   Temp 98.5 F (36.9 C) (Oral)   Ht 5' 0.24" (1.53 m)   Wt 252 lb 6.4 oz (114.5 kg)   SpO2 95%   BMI 48.91 kg/m    Subjective:    Patient ID: Jillian Pierce, female    DOB: 1956/02/06, 67 y.o.   MRN: [id number]  HPI: Jillian Pierce is a 67 y.o. female  Chief Complaint  Patient presents with   Diabetes   Hypertension   Hyperlipidemia   Obesity   NOTE WRITTEN BY DNP STUDENT.  ASSESSMENT AND PLAN OF CARE REVIEWED WITH STUDENT, AGREE WITH ABOVE FINDINGS AND PLAN.   DIABETES Last A1c November was 6.9%.  Continues Metformin 250 MG BID.  Takes Gabapentin for chronic back and hip pain. Hypoglycemic episodes:no Polydipsia/polyuria: no Visual disturbance: no Chest pain: no Paresthesias: yes Glucose Monitoring: yes  Accucheck frequency: Daily  Fasting glucose:  Post prandial:  Evening:  Before meals: Taking Insulin?: no  Long acting insulin:  Short acting insulin: Blood Pressure Monitoring: not checking Retinal Examination: Not up to Date-scheduled in August 2024 Foot Exam: Up to Date Diabetic Education: Completed Pneumovax: Up to Date Influenza: Not up to Date Aspirin: yes   HYPERTENSION / HYPERLIPIDEMIA Continues on Lipitor and Norvasc, Lisinopril, Clonidine, and Lasix.   Echo on 02/28/20 noted EF 60-65% and moderate LVH with mild aortic valve sclerosis.   Satisfied with current treatment? yes Duration of hypertension: years BP monitoring frequency: not checking BP range:  BP medication side effects: no Duration of hyperlipidemia: years Cholesterol medication side effects: no Cholesterol supplements: none Medication compliance: excellent compliance Aspirin: yes Recent stressors: no Recurrent headaches: no Visual changes: no Palpitations: no Dyspnea: yes-on exertion with walking/climbing stairs Chest pain: no Lower extremity edema: yes-states is doing better, wears compression socks usually Dizzy/lightheaded: no   PNEUMONIA &  OA Recent flu-related pneumonia noted on 05/05/22 with subtle opacities right mid lung and left base. Treated in ER with Augmentin and Zpack.  Pt reports she is doing much better since being treated for PNA. She denies any new or worsening acute symptoms and states she is trying to stay away from anyone sick. Pt does report some sinus congestion and would like a refill on her flonase prescription. 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 Pt has not been taking meloxicam, that was originally prescribed for her left knee, but would need a refill on this if she prefers to take this for her arthritic pain.  Fever: no Cough: yes-occasional, reports better Shortness of breath: yes Wheezing: no Chest pain: no Chest tightness: no Chest congestion: no Nasal congestion: yes Runny nose: yes Post nasal drip: no Sneezing: no Sore throat: no Swollen glands: no Sinus pressure: no Headache: no Face pain: no Toothache: no Ear pain: no bilateral Ear pressure: no bilateral Eyes red/itching:no Eye drainage/crusting: no  Vomiting: no Rash: no Fatigue: yes-reports baseline for her Sick contacts: no Strep contacts: no  Context: better Recurrent sinusitis: no Relief with OTC cold/cough medications: yes  Treatments attempted:  see above previous treatment plan     Relevant past medical, surgical, family and social history reviewed and updated as indicated. Interim medical history since our last visit reviewed. Allergies and medications reviewed and updated.  Review of Systems  Constitutional:  Positive for fatigue. Negative for appetite change, chills and fever.  HENT:  Negative for congestion, ear pain, postnasal drip, rhinorrhea, sinus pressure, sinus pain,  sneezing and sore throat.   Eyes:  Negative for visual disturbance.  Respiratory:  Positive for cough. Negative for choking and chest tightness.   Cardiovascular:  Positive for leg swelling. Negative for chest  pain and palpitations.  Gastrointestinal:  Negative for abdominal pain, nausea and vomiting.  Genitourinary:  Negative for dysuria, frequency and hematuria.  Musculoskeletal:  Positive for arthralgias.  Neurological:  Negative for dizziness, light-headedness, numbness and headaches.  Psychiatric/Behavioral:  Negative for self-injury, sleep disturbance and suicidal ideas. The patient is not nervous/anxious.     Per HPI unless specifically indicated above     Objective:    BP 111/75   Pulse 89   Temp 98.5 F (36.9 C) (Oral)   Ht 5' 0.24" (1.53 m)   Wt 252 lb 6.4 oz (114.5 kg)   SpO2 95%   BMI 48.91 kg/m   Wt Readings from Last 3 Encounters:  06/04/22 252 lb 6.4 oz (114.5 kg)  05/05/22 250 lb (113.4 kg)  04/18/22 252 lb 4.8 oz (114.4 kg)    Physical Exam Vitals and nursing note reviewed.  Constitutional:      General: She is awake. She is not in acute distress.    Appearance: Normal appearance. She is well-groomed. She is obese. She is not ill-appearing or toxic-appearing.  HENT:     Head: Normocephalic and atraumatic.     Nose: Congestion and rhinorrhea present.     Comments: Occasional congestion/rhinorrhea Cardiovascular:     Rate and Rhythm: Normal rate and regular rhythm.     Pulses: Normal pulses.     Heart sounds: Normal heart sounds.  Pulmonary:     Effort: Pulmonary effort is normal. No accessory muscle usage or respiratory distress.     Breath sounds: Normal breath sounds.  Abdominal:     General: Bowel sounds are normal.     Palpations: Abdomen is soft.  Musculoskeletal:        General: Swelling present. No tenderness or signs of injury.     Cervical back: Normal range of motion and neck supple.     Comments: Reports some swelling to her legs but she routinely wears compression socks for this and sees improvement  Lymphadenopathy:     Cervical: No cervical adenopathy.  Neurological:     General: No focal deficit present.     Mental Status: She is alert  and oriented to person, place, and time. Mental status is at baseline.  Psychiatric:        Mood and Affect: Mood normal.        Behavior: Behavior normal. Behavior is cooperative.        Thought Content: Thought content normal.        Judgment: Judgment normal.    Results for orders placed or performed during the hospital encounter of 05/05/22  CBC with Differential  Result Value Ref Range   WBC 5.1 4.0 - 10.5 K/uL   RBC 4.23 3.87 - 5.11 MIL/uL   Hemoglobin 11.2 (L) 12.0 - 15.0 g/dL   HCT 35.0 (L) 36.0 - 46.0 %   MCV 82.7 80.0 - 100.0 fL   MCH 26.5 26.0 - 34.0 pg   MCHC 32.0 30.0 - 36.0 g/dL   RDW 14.8 11.5 - 15.5 %   Platelets 442 (H) 150 - 400 K/uL   nRBC 0.0 0.0 - 0.2 %   Neutrophils Relative % 64 %   Neutro Abs 3.3 1.7 - 7.7 K/uL   Lymphocytes Relative 21 %   Lymphs  Abs 1.1 0.7 - 4.0 K/uL   Monocytes Relative 12 %   Monocytes Absolute 0.6 0.1 - 1.0 K/uL   Eosinophils Relative 3 %   Eosinophils Absolute 0.1 0.0 - 0.5 K/uL   Basophils Relative 0 %   Basophils Absolute 0.0 0.0 - 0.1 K/uL   Immature Granulocytes 0 %   Abs Immature Granulocytes 0.02 0.00 - 0.07 K/uL  Comprehensive metabolic panel  Result Value Ref Range   Sodium 137 135 - 145 mmol/L   Potassium 3.4 (L) 3.5 - 5.1 mmol/L   Chloride 103 98 - 111 mmol/L   CO2 24 22 - 32 mmol/L   Glucose, Bld 115 (H) 70 - 99 mg/dL   BUN 9 8 - 23 mg/dL   Creatinine, Ser 0.73 0.44 - 1.00 mg/dL   Calcium 8.9 8.9 - 10.3 mg/dL   Total Protein 7.8 6.5 - 8.1 g/dL   Albumin 4.0 3.5 - 5.0 g/dL   AST 22 15 - 41 U/L   ALT 15 0 - 44 U/L   Alkaline Phosphatase 71 38 - 126 U/L   Total Bilirubin 0.8 0.3 - 1.2 mg/dL   GFR, Estimated >60 >60 mL/min   Anion gap 10 5 - 15  Brain natriuretic peptide  Result Value Ref Range   B Natriuretic Peptide 37.4 0.0 - 100.0 pg/mL  Troponin I (High Sensitivity)  Result Value Ref Range   Troponin I (High Sensitivity) 7 <18 ng/L  Troponin I (High Sensitivity)  Result Value Ref Range   Troponin I  (High Sensitivity) 7 <18 ng/L      Assessment & Plan:   Problem List Items Addressed This Visit       Cardiovascular and Mediastinum   Hypertension associated with diabetes (HCC)    Chronic, stable. BP at goal in office today. Continue current medication regimen and adjust as needed. Recommended checking BP at home 3 times a week and documenting for provider. Discussed heart healthy DASH diet. Labs today-CBC, CMP, TSH. Denies need for refills at this time. Return in 6 months.      Relevant Orders   HgB A1c   Urine Microalbumin w/creat. ratio   CBC with Differential/Platelet   Comprehensive metabolic panel     Respiratory   Pneumonia of right middle lobe due to influenza A virus    Post-antibiotic treatment for PNA in January. Repeat CXR today and Labs-CBC.      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 Continue current medication regimen and adjust as needed. Labs ordered-A1c, CMP, urine microalbumin.      Relevant Orders   HgB A1c   Urine Microalbumin w/creat. ratio   Comprehensive metabolic panel   Hyperlipidemia associated with type 2 diabetes mellitus (HCC)    Chronic, ongoing. Continue current medication regimen and adjust as needed. Lipi panel today. Return in 6 months.      Relevant Orders   HgB A1c   Comprehensive metabolic panel   Lipid Panel w/o Chol/HDL Ratio   Type 2 diabetes mellitus with diabetic neuropathy, unspecified (HCC)    Chronic, ongoing with recent A1c level 6.9%. Fasting BS 109 today. Checking BS daily. Continue current medication regimen and adjust as needed. Declines GLP1. Labs today-A1c, urine microalbumin, CMP. Continue Vitamin B12 and gabapentin for neuropathy.  Return in 6 months.       Relevant Orders   HgB A1c   Urine Microalbumin  w/creat. 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 Meloxicam refilled and discussed up to 3069m tylenol daily      Relevant Medications   meloxicam (MOBIC) 15 MG tablet     Other   Heart murmur    Aortic stenosis and moderate LVH on past echo. Denies symptoms currently. Consider cardiology referral in future if new or worsening symptoms arise.       Relevant Orders   Comprehensive metabolic panel   [place]    BMI 48.91, trending down, with T2DM and HTN/HLD. Discussed eating smaller high protein, low fat meals along with 30 minutes of exercise 5 times weekly with goal weight loss of 10-15 pounds in 3 months.         Follow up plan: Return in about 6 months (around 12/03/2022) for T2DM, HTN/HLD, OSTEOPENIA.

## 2022-06-04 NOTE — Assessment & Plan Note (Signed)
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 Meloxicam refilled and discussed up to 3059m tylenol daily

## 2022-06-04 NOTE — Assessment & Plan Note (Signed)
Chronic, ongoing. Continue current medication regimen and adjust as needed. Lipi panel today. Return in 6 months.

## 2022-06-04 NOTE — Assessment & Plan Note (Addendum)
Chronic, ongoing with recent A1c level 6.9%. Fasting BS 109 today. Checking BS daily. Continue current medication regimen and adjust as needed. Declines GLP1. Labs today-A1c, urine microalbumin, CMP. Continue Vitamin B12 and gabapentin for neuropathy. Return in 6 months.

## 2022-06-04 NOTE — Assessment & Plan Note (Addendum)
BMI 48.91, trending down, with T2DM and HTN/HLD. Discussed eating smaller high protein, low fat meals along with 30 minutes of exercise 5 times weekly with goal weight loss of 10-15 pounds in 3 months.

## 2022-06-04 NOTE — Assessment & Plan Note (Signed)
Aortic stenosis and moderate LVH on past echo. Denies symptoms currently. Consider cardiology referral in future if new or worsening symptoms arise.

## 2022-06-04 NOTE — Assessment & Plan Note (Signed)
Chronic, ongoing and stable. Continue current medication regimen and adjust as needed. Labs ordered-A1c, CMP, urine microalbumin.

## 2022-06-04 NOTE — Assessment & Plan Note (Signed)
Post-antibiotic treatment for PNA in January. Repeat CXR today and Labs-CBC.

## 2022-06-04 NOTE — Assessment & Plan Note (Signed)
Chronic, stable. BP at goal in office today. Continue current medication regimen and adjust as needed. Recommended checking BP at home 3 times a week and documenting for provider. Discussed heart healthy DASH diet. Labs today-CBC, CMP, TSH. Denies need for refills at this time. Return in 6 months.

## 2022-06-05 LAB — COMPREHENSIVE METABOLIC PANEL
ALT: 14 IU/L (ref 0–32)
AST: 14 IU/L (ref 0–40)
Albumin/Globulin Ratio: 1.4 (ref 1.2–2.2)
Albumin: 4.4 g/dL (ref 3.9–4.9)
Alkaline Phosphatase: 89 IU/L (ref 44–121)
BUN/Creatinine Ratio: 14 (ref 12–28)
BUN: 12 mg/dL (ref 8–27)
Bilirubin Total: 0.4 mg/dL (ref 0.0–1.2)
CO2: 21 mmol/L (ref 20–29)
Calcium: 9.8 mg/dL (ref 8.7–10.3)
Chloride: 102 mmol/L (ref 96–106)
Creatinine, Ser: 0.87 mg/dL (ref 0.57–1.00)
Globulin, Total: 3.1 g/dL (ref 1.5–4.5)
Glucose: 113 mg/dL — ABNORMAL HIGH (ref 70–99)
Potassium: 4.1 mmol/L (ref 3.5–5.2)
Sodium: 140 mmol/L (ref 134–144)
Total Protein: 7.5 g/dL (ref 6.0–8.5)
eGFR: 73 mL/min/{1.73_m2} (ref >59)

## 2022-06-05 LAB — CBC WITH DIFFERENTIAL/PLATELET
Basophils Absolute: 0.1 10*3/uL (ref 0.0–0.2)
Basos: 1 %
EOS (ABSOLUTE): 0.3 10*3/uL (ref 0.0–0.4)
Eos: 5 %
Hematocrit: 37.1 % (ref 34.0–46.6)
Hemoglobin: 11.6 g/dL (ref 11.1–15.9)
Immature Grans (Abs): 0 10*3/uL (ref 0.0–0.1)
Immature Granulocytes: 1 %
Lymphocytes Absolute: 1.5 10*3/uL (ref 0.7–3.1)
Lymphs: 23 %
MCH: 26.2 pg — ABNORMAL LOW (ref 26.6–33.0)
MCHC: 31.3 g/dL — ABNORMAL LOW (ref 31.5–35.7)
MCV: 84 fL (ref 79–97)
Monocytes Absolute: 0.5 10*3/uL (ref 0.1–0.9)
Monocytes: 8 %
Neutrophils Absolute: 3.9 10*3/uL (ref 1.4–7.0)
Neutrophils: 62 %
Platelets: 508 10*3/uL — ABNORMAL HIGH (ref 150–450)
RBC: 4.42 x10E6/uL (ref 3.77–5.28)
RDW: 14.5 % (ref 11.7–15.4)
WBC: 6.3 10*3/uL (ref 3.4–10.8)

## 2022-06-05 LAB — LIPID PANEL W/O CHOL/HDL RATIO
Cholesterol, Total: 119 mg/dL (ref 100–199)
HDL: 50 mg/dL (ref >39)
LDL Chol Calc (NIH): 54 mg/dL (ref 0–99)
Triglycerides: 74 mg/dL (ref 0–149)
VLDL Cholesterol Cal: 15 mg/dL (ref 5–40)

## 2022-06-05 LAB — HEMOGLOBIN A1C
Est. average glucose Bld gHb Est-mCnc: 157 mg/dL
Hgb A1c MFr Bld: 7.1 % — ABNORMAL HIGH (ref 4.8–5.6)

## 2022-06-05 LAB — MICROALBUMIN / CREATININE URINE RATIO
Creatinine, Urine: 49.8 mg/dL
Microalb/Creat Ratio: 138 mg/g creat — ABNORMAL HIGH (ref 0–29)
Microalbumin, Urine: 68.6 ug/mL

## 2022-06-05 NOTE — Progress Notes (Signed)
Contacted via MyChart   Good morning Mckaylee, your labs have returned: - A1c has crept up a little to 7.1% from 6.9%.  I recommend at this time continuing current medication regimen and we may increase next visit if ongoing elevations. - CBC shows ongoing elevations in platelets, which we will continue to monitor as this check it has trended up a little. - Kidney function, creatinine and eGFR, remains normal, as is liver function, AST and ALT.  - Cholesterol labs remain stable.  Continue current medication regimen.  Any questions? Keep being amazing!!  Thank you for allowing me to participate in your care.  I appreciate you. Kindest regards, Kazim Corrales

## 2022-06-06 NOTE — Progress Notes (Signed)
Contacted via MyChart   Good morning Jillian Pierce.  Your imaging came back and pneumonia has resolved!!  [place] news!!

## 2022-06-12 ENCOUNTER — Telehealth: Payer: Self-pay | Admitting: Nurse Practitioner

## 2022-06-12 NOTE — Telephone Encounter (Signed)
Contacted Jillian Pierce to schedule their annual wellness visit. Appointment made for 07/02/2022.  Sherol Dade; Care [facility] Direct Dial: [phone]

## 2022-06-17 NOTE — Telephone Encounter (Signed)
ERROR

## 2022-06-25 ENCOUNTER — Telehealth: Payer: Self-pay | Admitting: Nurse Practitioner

## 2022-06-25 DIAGNOSIS — Z1231 Encounter for screening mammogram for malignant neoplasm of breast: Secondary | ICD-10-CM

## 2022-06-25 NOTE — Telephone Encounter (Signed)
Copied from [place] [phone]. Topic: General - Inquiry >> Jun 25, 2022 10:45 AM Marcellus Scott wrote: Reason for CRM: Pt is requesting an order for her to be able to schedule a mammogram. She stated she reached out to [facility] to schedule and was advised an order was needed.  Please advise.

## 2022-06-28 ENCOUNTER — Other Ambulatory Visit: Payer: Self-pay | Admitting: Nurse Practitioner

## 2022-06-28 DIAGNOSIS — N63 Unspecified lump in unspecified breast: Secondary | ICD-10-CM

## 2022-07-02 ENCOUNTER — Ambulatory Visit (INDEPENDENT_AMBULATORY_CARE_PROVIDER_SITE_OTHER): Payer: Medicare PPO

## 2022-07-02 VITALS — Ht 60.25 in | Wt 252.0 lb

## 2022-07-02 DIAGNOSIS — Z0000 Encounter for general adult medical examination without abnormal findings: Secondary | ICD-10-CM

## 2022-07-02 NOTE — Progress Notes (Signed)
I connected with  Orson Aloe on 07/02/22 by a audio enabled telemedicine application and verified that I am speaking with the correct person using two identifiers.  Patient Location: Home  Provider Location: Office/Clinic  I discussed the limitations of evaluation and management by telemedicine. The patient expressed understanding and agreed to proceed.  Subjective:   Jillian Pierce is a 67 y.o. female who presents for Medicare Annual (Subsequent) preventive examination.  Review of Systems     Cardiac Risk Factors include: advanced age (>14mn, >>83women);diabetes mellitus;sedentary lifestyle;obesity (BMI >30kg/m2);family history of premature cardiovascular disease;hypertension     Objective:    There were no vitals filed for this visit. There is no height or weight on file to calculate BMI.     07/02/2022   11:07 AM 05/05/2022    2:22 PM 06/28/2021   11:35 AM 06/26/2020   11:16 AM 06/17/2019   10:56 AM 11/03/2018   12:41 PM 07/15/2018    9:24 AM  Advanced Directives  Does Patient Have a Medical Advance Directive? No No No No No No No  Would patient like information on creating a medical advance directive? No - Patient declined  No - Patient declined   No - Patient declined     Current Medications (verified) Outpatient Encounter Medications as of 07/02/2022  Medication Sig   albuterol (VENTOLIN HFA) 108 (90 Base) MCG/ACT inhaler Inhale 2 puffs into the lungs every 6 (six) hours as needed for wheezing or shortness of breath.   amLODipine (NORVASC) 10 MG tablet Take 1 tablet (10 mg total) by mouth daily.   Ascorbic Acid (VITAMIN C PO) Take by mouth daily.   aspirin EC 81 MG tablet Take 81 mg by mouth daily.   atorvastatin (LIPITOR) 10 MG tablet Take 1 tablet (10 mg total) by mouth daily.   Blood Glucose Monitoring Suppl (TRUE METRIX AIR GLUCOSE METER) w/Device KIT Use to check blood sugar 1-3 times a day.   cloNIDine (CATAPRES) 0.3 MG tablet Take 1 tablet (0.3 mg total) by mouth 2  (two) times daily.   Cyanocobalamin (VITAMIN B 12 PO) Take by mouth daily.   fluticasone (FLONASE) 50 MCG/ACT nasal spray Place 2 sprays into both nostrils daily.   furosemide (LASIX) 20 MG tablet Take 1 tablet (20 mg total) by mouth daily.   gabapentin (NEURONTIN) 600 MG tablet Take 0.5 tablets (300 mg total) by mouth at bedtime.   glucose blood (TRUE METRIX BLOOD GLUCOSE TEST) test strip Use to check blood sugar 1-2 times daily.   lisinopril (ZESTRIL) 10 MG tablet Take 1 tablet (10 mg total) by mouth daily.   meloxicam (MOBIC) 15 MG tablet Take 1 tablet (15 mg total) by mouth daily as needed for pain. As needed   metFORMIN (GLUCOPHAGE) 500 MG tablet TAKE ONE-HALF TABLET BY MOUTH TWICE DAILY   TRUEplus Lancets 33G MISC Use to check blood sugar 1-3 times daily.   VITAMIN D PO Take by mouth daily.   benzonatate (TESSALON) 200 MG capsule Take 1 capsule (200 mg total) by mouth 2 (two) times daily as needed for cough. (Patient not taking: Reported on 07/02/2022)   No facility-administered encounter medications on file as of 07/02/2022.    Allergies (verified) Patient has no known allergies.   History: Past Medical History:  Diagnosis Date   Diabetes mellitus without complication ([facility]    Hypertension    Left knee pain 05/17/2014   [place]   Past Surgical History:  Procedure Laterality Date  ABDOMINAL HYSTERECTOMY     complete   ACHILLES TENDON SURGERY Left    COLONOSCOPY WITH PROPOFOL N/A 11/03/2018   Procedure: COLONOSCOPY WITH PROPOFOL;  Surgeon: Lin Landsman, MD;  Location: [facility] ENDOSCOPY;  Service: Gastroenterology;  Laterality: N/A;   HERNIA REPAIR  11/2014   INSERTION OF MESH N/A 11/23/2014   Procedure: INSERTION OF MESH;  Surgeon: Sherri Rad, MD;  Location: [facility] ORS;  Service: General;  Laterality: N/A;   JOINT REPLACEMENT Left 12/18/2018   VENTRAL HERNIA REPAIR N/A 11/23/2014   Procedure: HERNIA REPAIR VENTRAL ADULT;  Surgeon: Sherri Rad, MD;   Location: [facility] ORS;  Service: General;  Laterality: N/A;   Family History  Problem Relation Age of Onset   Hypertension Mother    Diabetes Mother    Kidney disease Mother    Hypertension Father    Diabetes Father    Heart failure Father    Heart disease Sister    Heart disease Brother    Hypertension Son    Breast cancer Maternal Aunt    Social History   Socioeconomic History   Marital status: Single    Spouse name: Not on file   Number of children: Not on file   Years of education: Not on file   Highest education level: Not on file  Occupational History   Occupation: disability  Tobacco Use   Smoking status: Never   Smokeless tobacco: Never  Vaping Use   Vaping Use: Never used  Substance and Sexual Activity   Alcohol use: No   Drug use: No   Sexual activity: Not Currently  Other Topics Concern   Not on file  Social History Narrative   Not on file   Social Determinants of Health   Financial Resource Strain: Low Risk  (07/02/2022)   Overall Financial Resource Strain (CARDIA)    Difficulty of Paying Living Expenses: Not hard at all  Food Insecurity: No Food Insecurity (07/02/2022)   Hunger Vital Sign    Worried About Running Out of Food in the Last Year: Never true    Ran Out of Food in the Last Year: Never true  Transportation Needs: No Transportation Needs (07/02/2022)   PRAPARE - Hydrologist (Medical): No    Lack of Transportation (Non-Medical): No  Physical Activity: Inactive (07/02/2022)   Exercise Vital Sign    Days of Exercise per Week: 0 days    Minutes of Exercise per Session: 0 min  Stress: No Stress Concern Present (07/02/2022)   [place]    Feeling of Stress : Not at all  Social Connections: Unknown (07/02/2022)   Social Connection and Isolation Panel [NHANES]    Frequency of Communication with Friends and Family: Twice a week    Frequency of Social  Gatherings with Friends and Family: Once a week    Attends Religious Services: Never    Marine scientist or Organizations: No    Attends Music therapist: Never    Marital Status: Patient refused    Tobacco Counseling Counseling given: Not Answered   Clinical Intake:  Pre-visit preparation completed: Yes  Pain : No/denies pain     Nutritional Risks: None Diabetes: Yes CBG done?: No Did pt. bring in CBG monitor from home?: No  How often do you need to have someone help you when you read instructions, pamphlets, or other written materials from your doctor or pharmacy?: 1 - Never  Diabetic?yes  Nutrition Risk Assessment:  Has the patient had any N/V/D within the last 2 months?  No  Does the patient have any non-healing wounds?  No  Has the patient had any unintentional weight loss or weight gain?  No   Diabetes:  Is the patient diabetic?  Yes  If diabetic, was a CBG obtained today?  No  Did the patient bring in their glucometer from home?  No  How often do you monitor your CBG's? Every day.   Financial Strains and Diabetes Management:  Are you having any financial strains with the device, your supplies or your medication? No .  Does the patient want to be seen by Chronic Care Management for management of their diabetes?  No  Would the patient like to be referred to a Nutritionist or for Diabetic Management?  No   Diabetic Exams:  Diabetic Eye Exam: Completed 07/26/19- scheduled for August. Overdue for diabetic eye exam. Pt has been advised about the importance in completing this exam.  Diabetic Foot Exam: Completed 02/20/22. Pt has been advised about the importance in completing this exam.   Interpreter Needed?: No  Information entered by :: Kirke Shaggy, LPN   Activities of Daily Living    07/02/2022   11:07 AM 02/20/2022   10:00 AM  In your present state of health, do you have any difficulty performing the following activities:  Hearing? 0 0   Vision? 0 0  Difficulty concentrating or making decisions? 0 0  Walking or climbing stairs? 0 0  Dressing or bathing? 0 0  Doing errands, shopping? 0 0  Preparing Food and eating ? N   Using the Toilet? N   In the past six months, have you accidently leaked urine? N   Do you have problems with loss of bowel control? N   Managing your Medications? N   Managing your Finances? N   Housekeeping or managing your Housekeeping? N     Patient Care Team: Venita Lick, NP as PCP - General (Nurse Practitioner)  Indicate any recent Medical Services you may have received from other than [facility] providers in the past year (date may be approximate).     Assessment:   This is a routine wellness examination for Jillian Pierce.  Hearing/Vision screen Hearing Screening - Comments:: No aids Vision Screening - Comments:: No glasses  Dietary issues and exercise activities discussed: Current Exercise Habits: The patient does not participate in regular exercise at present   Goals Addressed             This Visit's Progress    DIET - EAT MORE FRUITS AND VEGETABLES         Depression Screen    07/02/2022   11:04 AM 06/04/2022    8:59 AM 02/20/2022    9:58 AM 09/24/2021    9:41 AM 08/16/2021   10:04 AM 06/28/2021   11:32 AM 02/15/2021    9:51 AM  PHQ 2/9 Scores  PHQ - 2 Score 0 0 0 0 0 0 0  PHQ- 9 Score 0 2 0 2 1  0    Fall Risk    07/02/2022   11:07 AM 02/20/2022   10:01 AM 02/20/2022    9:58 AM 09/24/2021    9:41 AM 06/28/2021   11:17 AM  Fall Risk   Falls in the past year? 0 0 0 0 0  Number falls in past yr: 0 0 0 0 0  Injury with Fall? 0 0 0 0 0  Risk for fall due to : No Fall Risks No Fall Risks No Fall Risks No Fall Risks   Follow up Falls prevention discussed;Falls evaluation completed Falls prevention discussed Falls evaluation completed Falls evaluation completed Falls evaluation completed;Falls prevention discussed    FALL RISK PREVENTION PERTAINING TO THE HOME:  Any stairs in or  around the home? Yes  If so, are there any without handrails? No  Home free of loose throw rugs in walkways, pet beds, electrical cords, etc? Yes  Adequate lighting in your home to reduce risk of falls? Yes   ASSISTIVE DEVICES UTILIZED TO PREVENT FALLS:  Life alert? No  Use of a cane, walker or w/c? Yes - cane Grab bars in the bathroom? No  Shower chair or bench in shower? No  Elevated toilet seat or a handicapped toilet? No   Cognitive Function:        07/02/2022   11:13 AM 06/26/2020   11:18 AM  6CIT Screen  What Year? 0 points 0 points  What month? 0 points 0 points  What time? 0 points 0 points  Count back from 20 0 points 0 points  Months in reverse 0 points 4 points  Repeat phrase 8 points 10 points  Total Score 8 points 14 points    Immunizations Immunization History  Administered Date(s) Administered   Fluad Quad(high Dose 65+) 12/28/2019, 02/15/2021   Influenza Inj Mdck Quad Pf 02/15/2016   Influenza,inj,Quad PF,6+ Mos 01/13/2018, 02/12/2019   Influenza-Unspecified 02/25/2013, 02/21/2015   PFIZER(Purple Top)SARS-COV-2 Vaccination 07/13/2019, 08/03/2019, 06/14/2020   Pneumococcal Conjugate-13 12/28/2019   Pneumococcal Polysaccharide-23 01/13/2018   Tdap 10/11/2020    TDAP status: Up to date  Flu Vaccine status: Declined, Education has been provided regarding the importance of this vaccine but patient still declined. Advised may receive this vaccine at local pharmacy or Health Dept. Aware to provide a copy of the vaccination record if obtained from local pharmacy or Health Dept. Verbalized acceptance and understanding.  Pneumococcal vaccine status: Up to date  Covid-19 vaccine status: Completed vaccines  Qualifies for Shingles Vaccine? Yes   Zostavax completed No   Shingrix Completed?: No.    Education has been provided regarding the importance of this vaccine. Patient has been advised to call insurance company to determine out of pocket expense if they have  not yet received this vaccine. Advised may also receive vaccine at local pharmacy or Health Dept. Verbalized acceptance and understanding.  Screening Tests Health Maintenance  Topic Date Due   OPHTHALMOLOGY EXAM  07/25/2020   COVID-19 Vaccine (4 - 2023-24 season) 12/21/2021   INFLUENZA VACCINE  07/21/2022 (Originally 11/20/2021)   Zoster Vaccines- Shingrix (1 of 2) 09/02/2022 (Originally 05/23/2004)   HEMOGLOBIN A1C  12/03/2022   MAMMOGRAM  01/04/2023   FOOT EXAM  02/21/2023   Diabetic kidney evaluation - eGFR measurement  06/05/2023   Diabetic kidney evaluation - Urine ACR  06/05/2023   Medicare Annual Wellness (AWV)  07/02/2023   Pneumonia Vaccine 20+ Years old (3 of 3 - PPSV23 or PCV20) 12/27/2024   COLONOSCOPY (Pts 45-24yr Insurance coverage will need to be confirmed)  11/02/2025   DEXA SCAN  01/03/2026   DTaP/Tdap/Td (2 - Td or Tdap) 10/12/2030   Hepatitis C Screening  Completed   HPV VACCINES  Aged Out    Health Maintenance  Health Maintenance Due  Topic Date Due   OPHTHALMOLOGY EXAM  07/25/2020   COVID-19 Vaccine (4 - 2023-24 season) 12/21/2021    Colorectal cancer screening: Type of  screening: Colonoscopy. Completed 11/03/18. Repeat every 7 years  Mammogram status: Completed scheduled 07/11/22. Repeat every year  Bone Density status: Completed 01/03/21. Results reflect: Bone density results: NORMAL. Repeat every 5 years.  Lung Cancer Screening: (Low Dose CT Chest recommended if Age 2-80 years, 30 pack-year currently smoking OR have quit w/in 15years.) does not qualify.   Additional Screening:  Hepatitis C Screening: does qualify; Completed 04/10/20  Vision Screening: Recommended annual ophthalmology exams for early detection of glaucoma and other disorders of the eye. Is the patient up to date with their annual eye exam?  Yes  Who is the provider or what is the name of the office in which the patient attends annual eye exams? [facility] If pt is not established with  a provider, would they like to be referred to a provider to establish care? No .   Dental Screening: Recommended annual dental exams for proper oral hygiene  Community Resource Referral / Chronic Care Management: CRR required this visit?  No   CCM required this visit?  No      Plan:     I have personally reviewed and noted the following in the patient's chart:   Medical and social history Use of alcohol, tobacco or illicit drugs  Current medications and supplements including opioid prescriptions. Patient is not currently taking opioid prescriptions. Functional ability and status Nutritional status Physical activity Advanced directives List of other physicians Hospitalizations, surgeries, and ER visits in previous 12 months Vitals Screenings to include cognitive, depression, and falls Referrals and appointments  In addition, I have reviewed and discussed with patient certain preventive protocols, quality metrics, and best practice recommendations. A written personalized care plan for preventive services as well as general preventive health recommendations were provided to patient.     Dionisio David, LPN   [id number]   Nurse Notes: none

## 2022-07-02 NOTE — Patient Instructions (Signed)
Ms. Jillian Pierce , Thank you for taking time to come for your Medicare Wellness Visit. I appreciate your ongoing commitment to your health goals. Please review the following plan we discussed and let me know if I can assist you in the future.   These are the goals we discussed:  Goals      DIET - EAT MORE FRUITS AND VEGETABLES     Patient Stated     06/26/2020, no goals     Weight (lb) < 200 lb (90.7 kg)        This is a list of the screening recommended for you and due dates:  Health Maintenance  Topic Date Due   Eye exam for diabetics  07/25/2020   COVID-19 Vaccine (4 - 2023-24 season) 12/21/2021   Flu Shot  07/21/2022*   Zoster (Shingles) Vaccine (1 of 2) 09/02/2022*   Hemoglobin A1C  12/03/2022   Mammogram  01/04/2023   Complete foot exam   02/21/2023   Yearly kidney function blood test for diabetes  06/05/2023   Yearly kidney health urinalysis for diabetes  06/05/2023   Medicare Annual Wellness Visit  07/02/2023   Pneumonia Vaccine (3 of 3 - PPSV23 or PCV20) 12/27/2024   Colon Cancer Screening  11/02/2025   DEXA scan (bone density measurement)  01/03/2026   DTaP/Tdap/Td vaccine (2 - Td or Tdap) 10/12/2030   Hepatitis C Screening: USPSTF Recommendation to screen - Ages 56-79 yo.  Completed   HPV Vaccine  Aged Out  *Topic was postponed. The date shown is not the original due date.    Advanced directives: no  Conditions/risks identified: none  Next appointment: Follow up in one year for your annual wellness visit 07/08/23 @ 9:45 am by phone   Preventive Care 65 Years and Older, Female Preventive care refers to lifestyle choices and visits with your health care provider that can promote health and wellness. What does preventive care include? A yearly physical exam. This is also called an annual well check. Dental exams once or twice a year. Routine eye exams. Ask your health care provider how often you should have your eyes checked. Personal lifestyle choices, including: Daily  care of your teeth and gums. Regular physical activity. Eating a healthy diet. Avoiding tobacco and drug use. Limiting alcohol use. Practicing safe sex. Taking low-dose aspirin every day. Taking vitamin and mineral supplements as recommended by your health care provider. What happens during an annual well check? The services and screenings done by your health care provider during your annual well check will depend on your age, overall health, lifestyle risk factors, and family history of disease. Counseling  Your health care provider may ask you questions about your: Alcohol use. Tobacco use. Drug use. Emotional well-being. Home and relationship well-being. Sexual activity. Eating habits. History of falls. Memory and ability to understand (cognition). Work and work Statistician. Reproductive health. Screening  You may have the following tests or measurements: Height, weight, and BMI. Blood pressure. Lipid and cholesterol levels. These may be checked every 5 years, or more frequently if you are over 19 years old. Skin check. Lung cancer screening. You may have this screening every year starting at age 89 if you have a 30-pack-year history of smoking and currently smoke or have quit within the past 15 years. Fecal occult blood test (FOBT) of the stool. You may have this test every year starting at age 46. Flexible sigmoidoscopy or colonoscopy. You may have a sigmoidoscopy every 5 years or a colonoscopy every  10 years starting at age 48. Hepatitis C blood test. Hepatitis B blood test. Sexually transmitted disease (STD) testing. Diabetes screening. This is done by checking your blood sugar (glucose) after you have not eaten for a while (fasting). You may have this done every 1-3 years. Bone density scan. This is done to screen for osteoporosis. You may have this done starting at age 23. Mammogram. This may be done every 1-2 years. Talk to your health care provider about how often you  should have regular mammograms. Talk with your health care provider about your test results, treatment options, and if necessary, the need for more tests. Vaccines  Your health care provider may recommend certain vaccines, such as: Influenza vaccine. This is recommended every year. Tetanus, diphtheria, and acellular pertussis (Tdap, Td) vaccine. You may need a Td booster every 10 years. Zoster vaccine. You may need this after age 75. Pneumococcal 13-valent conjugate (PCV13) vaccine. One dose is recommended after age 32. Pneumococcal polysaccharide (PPSV23) vaccine. One dose is recommended after age 54. Talk to your health care provider about which screenings and vaccines you need and how often you need them. This information is not intended to replace advice given to you by your health care provider. Make sure you discuss any questions you have with your health care provider. Document Released: 05/05/2015 Document Revised: 12/27/2015 Document Reviewed: 02/07/2015 Elsevier Interactive Patient Education  2017 [place] Prevention in the Home Falls can cause injuries. They can happen to people of all ages. There are many things you can do to make your home safe and to help prevent falls. What can I do on the outside of my home? Regularly fix the edges of walkways and driveways and fix any cracks. Remove anything that might make you trip as you walk through a door, such as a raised step or threshold. Trim any bushes or trees on the path to your home. Use bright outdoor lighting. Clear any walking paths of anything that might make someone trip, such as rocks or tools. Regularly check to see if handrails are loose or broken. Make sure that both sides of any steps have handrails. Any raised decks and porches should have guardrails on the edges. Have any leaves, snow, or ice cleared regularly. Use sand or salt on walking paths during winter. Clean up any spills in your garage right away.  This includes oil or grease spills. What can I do in the bathroom? Use night lights. Install grab bars by the toilet and in the tub and shower. Do not use towel bars as grab bars. Use non-skid mats or decals in the tub or shower. If you need to sit down in the shower, use a plastic, non-slip stool. Keep the floor dry. Clean up any water that spills on the floor as soon as it happens. Remove soap buildup in the tub or shower regularly. Attach bath mats securely with double-sided non-slip rug tape. Do not have throw rugs and other things on the floor that can make you trip. What can I do in the bedroom? Use night lights. Make sure that you have a light by your bed that is easy to reach. Do not use any sheets or blankets that are too big for your bed. They should not hang down onto the floor. Have a firm chair that has side arms. You can use this for support while you get dressed. Do not have throw rugs and other things on the floor that can make you  trip. What can I do in the kitchen? Clean up any spills right away. Avoid walking on wet floors. Keep items that you use a lot in easy-to-reach places. If you need to reach something above you, use a strong step stool that has a grab bar. Keep electrical cords out of the way. Do not use floor polish or wax that makes floors slippery. If you must use wax, use non-skid floor wax. Do not have throw rugs and other things on the floor that can make you trip. What can I do with my stairs? Do not leave any items on the stairs. Make sure that there are handrails on both sides of the stairs and use them. Fix handrails that are broken or loose. Make sure that handrails are as long as the stairways. Check any carpeting to make sure that it is firmly attached to the stairs. Fix any carpet that is loose or worn. Avoid having throw rugs at the top or bottom of the stairs. If you do have throw rugs, attach them to the floor with carpet tape. Make sure that  you have a light switch at the top of the stairs and the bottom of the stairs. If you do not have them, ask someone to add them for you. What else can I do to help prevent falls? Wear shoes that: Do not have high heels. Have rubber bottoms. Are comfortable and fit you well. Are closed at the toe. Do not wear sandals. If you use a stepladder: Make sure that it is fully opened. Do not climb a closed stepladder. Make sure that both sides of the stepladder are locked into place. Ask someone to hold it for you, if possible. Clearly mark and make sure that you can see: Any grab bars or handrails. First and last steps. Where the edge of each step is. Use tools that help you move around (mobility aids) if they are needed. These include: Canes. Walkers. Scooters. Crutches. Turn on the lights when you go into a dark area. Replace any light bulbs as soon as they burn out. Set up your furniture so you have a clear path. Avoid moving your furniture around. If any of your floors are uneven, fix them. If there are any pets around you, be aware of where they are. Review your medicines with your doctor. Some medicines can make you feel dizzy. This can increase your chance of falling. Ask your doctor what other things that you can do to help prevent falls. This information is not intended to replace advice given to you by your health care provider. Make sure you discuss any questions you have with your health care provider. Document Released: 02/02/2009 Document Revised: 09/14/2015 Document Reviewed: 05/13/2014 Elsevier Interactive Patient Education  2017 [employer].

## 2022-07-11 ENCOUNTER — Ambulatory Visit
Admission: RE | Admit: 2022-07-11 | Discharge: 2022-07-11 | Disposition: A | Discharge status: Home / Self Care | Payer: Medicare PPO | Source: Ambulatory Visit | Attending: Nurse Practitioner | Admitting: Nurse Practitioner

## 2022-07-11 DIAGNOSIS — N63 Unspecified lump in unspecified breast: Secondary | ICD-10-CM | POA: Diagnosis not present

## 2022-07-11 DIAGNOSIS — R92323 Mammographic fibroglandular density, bilateral breasts: Secondary | ICD-10-CM | POA: Insufficient documentation

## 2022-07-11 DIAGNOSIS — Z1239 Encounter for other screening for malignant neoplasm of breast: Secondary | ICD-10-CM | POA: Insufficient documentation

## 2022-07-11 DIAGNOSIS — R928 Other abnormal and inconclusive findings on diagnostic imaging of breast: Secondary | ICD-10-CM | POA: Diagnosis not present

## 2022-07-11 NOTE — Progress Notes (Signed)
Contacted via MyChart   Normal mammogram, may repeat in one year:)

## 2022-07-25 ENCOUNTER — Telehealth: Payer: Self-pay | Admitting: Nurse Practitioner

## 2022-07-25 NOTE — Telephone Encounter (Signed)
Copied from [place] [id number]. Topic: General - Other >> Jul 25, 2022  1:22 PM Everette C wrote: Reason for CRM: The patient has called to share that they will be seen by Dr. Halina Andreas 11/01/22 at 3 PM   Please contact the patient further if needed

## 2022-07-25 NOTE — Telephone Encounter (Signed)
Just a FYI

## 2022-10-18 ENCOUNTER — Telehealth: Payer: Medicare PPO | Admitting: Physician Assistant

## 2022-10-18 ENCOUNTER — Ambulatory Visit: Payer: Self-pay | Admitting: *Deleted

## 2022-10-18 DIAGNOSIS — J208 Acute bronchitis due to other specified organisms: Secondary | ICD-10-CM

## 2022-10-18 DIAGNOSIS — B9689 Other specified bacterial agents as the cause of diseases classified elsewhere: Secondary | ICD-10-CM | POA: Diagnosis not present

## 2022-10-18 MED ORDER — DOXYCYCLINE HYCLATE 100 MG PO TABS: 100.0000 mg | ORAL_TABLET | Freq: Two times a day (BID) | ORAL | 0 refills | Status: DC

## 2022-10-18 MED ORDER — BENZONATATE 100 MG PO CAPS: 100.0000 mg | ORAL_CAPSULE | Freq: Three times a day (TID) | ORAL | 0 refills | Status: DC | PRN

## 2022-10-18 NOTE — Telephone Encounter (Signed)
Appointment has been scheduled.

## 2022-10-18 NOTE — Telephone Encounter (Signed)
Reason for Disposition . Cough has been present for > 3 weeks  Answer Assessment - Initial Assessment Questions 1. ONSET: "When did the cough begin?"      3 weeks 2. SEVERITY: "How bad is the cough today?"      Worse at night 3. SPUTUM: "Describe the color of your sputum" (none, dry cough; clear, white, yellow, green)     none 4. HEMOPTYSIS: "Are you coughing up any blood?" If so ask: "How much?" (flecks, streaks, tablespoons, etc.)     no 5. DIFFICULTY BREATHING: "Are you having difficulty breathing?" If Yes, ask: "How bad is it?" (e.g., mild, moderate, severe)    - MILD: No SOB at rest, mild SOB with walking, speaks normally in sentences, can lie down, no retractions, pulse < 100.    - MODERATE: SOB at rest, SOB with minimal exertion and prefers to sit, cannot lie down flat, speaks in phrases, mild retractions, audible wheezing, pulse 100-120.    - SEVERE: Very SOB at rest, speaks in single words, struggling to breathe, sitting hunched forward, retractions, pulse > 120      no 6. FEVER: "Do you have a fever?" If Yes, ask: "What is your temperature, how was it measured, and when did it start?"     no 7. CARDIAC HISTORY: "Do you have any history of heart disease?" (e.g., heart attack, congestive heart failure)      no 8. LUNG HISTORY: "Do you have any history of lung disease?"  (e.g., pulmonary embolus, asthma, emphysema)     no  10. OTHER SYMPTOMS: "Do you have any other symptoms?" (e.g., runny nose, wheezing, chest pain)       Wheezing  Protocols used: Cough - Acute Non-Productive-A-AH

## 2022-10-18 NOTE — Progress Notes (Signed)
Virtual Visit Consent   Jillian Pierce, you are scheduled for a virtual visit with a [facility] provider today. Just as with appointments in the office, your consent must be obtained to participate. Your consent will be active for this visit and any virtual visit you may have with one of our providers in the next 365 days. If you have a MyChart account, a copy of this consent can be sent to you electronically.  As this is a virtual visit, video technology does not allow for your provider to perform a traditional examination. This may limit your provider's ability to fully assess your condition. If your provider identifies any concerns that need to be evaluated in person or the need to arrange testing (such as labs, EKG, etc.), we will make arrangements to do so. Although advances in technology are sophisticated, we cannot ensure that it will always work on either your end or our end. If the connection with a video visit is poor, the visit may have to be switched to a telephone visit. With either a video or telephone visit, we are not always able to ensure that we have a secure connection.  By engaging in this virtual visit, you consent to the provision of healthcare and authorize for your insurance to be billed (if applicable) for the services provided during this visit. Depending on your insurance coverage, you may receive a charge related to this service.  I need to obtain your verbal consent now. Are you willing to proceed with your visit today? Jillian Pierce has provided verbal consent on 10/18/2022 for a virtual visit (video or telephone). Jillian Pierce, [place]  Date: 10/18/2022 5:10 PM  Virtual Visit via Video Note   I, Jillian Pierce, connected with  Jillian Pierce  ([id number], 11-Dec-1955) on 10/18/22 at  4:30 PM EDT by a video-enabled telemedicine application and verified that I am speaking with the correct person using two identifiers.  Location: Patient: Virtual Visit Location  Patient: Home Provider: Virtual Visit Location Provider: Home Office   I discussed the limitations of evaluation and management by telemedicine and the availability of in person appointments. The patient expressed understanding and agreed to proceed.    History of Present Illness: Jillian Pierce is a 67 y.o. who identifies as a female who was assigned female at birth, and is being seen today for 3 weeks of chest congestion and persistent cough that is mostly dry but she states she cannot hear anything to come up and now.  Denies fevers or chills.  Does note some mild fatigue.  Cough is worse at night.  Denies any sinus pressure or sinus pain.  Denies heartburn or indigestion.  Denies recent travel or sick contact.  HPI: HPI  Problems:  Patient Active Problem List   Diagnosis Date Noted   Osteoarthritis of both hands 06/04/2022   Pneumonia of right middle lobe due to influenza A virus 06/02/2022   Advanced care planning/counseling discussion 02/15/2021   Osteopenia of neck of left femur 02/08/2021   Heart murmur 02/08/2020   Edema of both legs 02/08/2020   Vitamin D deficiency 02/14/2019   Vitamin B12 deficiency 02/14/2019   Osteoarthritis of left shoulder 07/06/2018   Diabetes mellitus with proteinuria (HCC) 07/03/2018   Allergic rhinitis 04/03/2018   Hyperlipidemia associated with type 2 diabetes mellitus (HCC) 03/05/2018   Morbid obesity (HCC) 02/15/2016   Type 2 diabetes mellitus with diabetic neuropathy, unspecified (HCC) 03/23/2015   Hypertension associated with diabetes (HCC)  03/23/2015    Allergies: No Known Allergies Medications:  Current Outpatient Medications:    benzonatate (TESSALON) 100 MG capsule, Take 1 capsule (100 mg total) by mouth 3 (three) times daily as needed for cough., Disp: 30 capsule, Rfl: 0   doxycycline (VIBRA-TABS) 100 MG tablet, Take 1 tablet (100 mg total) by mouth 2 (two) times daily., Disp: 14 tablet, Rfl: 0   albuterol (VENTOLIN HFA) 108 (90 Base)  MCG/ACT inhaler, Inhale 2 puffs into the lungs every 6 (six) hours as needed for wheezing or shortness of breath., Disp: 9 g, Rfl: 1   amLODipine (NORVASC) 10 MG tablet, Take 1 tablet (10 mg total) by mouth daily., Disp: 90 tablet, Rfl: 4   Ascorbic Acid (VITAMIN C PO), Take by mouth daily., Disp: , Rfl:    aspirin EC 81 MG tablet, Take 81 mg by mouth daily., Disp: , Rfl:    atorvastatin (LIPITOR) 10 MG tablet, Take 1 tablet (10 mg total) by mouth daily., Disp: 90 tablet, Rfl: 4   Blood Glucose Monitoring Suppl (TRUE METRIX AIR GLUCOSE METER) w/Device KIT, Use to check blood sugar 1-3 times a day., Disp: 1 kit, Rfl: 1   cloNIDine (CATAPRES) 0.3 MG tablet, Take 1 tablet (0.3 mg total) by mouth 2 (two) times daily., Disp: 180 tablet, Rfl: 4   Cyanocobalamin (VITAMIN B 12 PO), Take by mouth daily., Disp: , Rfl:    fluticasone (FLONASE) 50 MCG/ACT nasal spray, Place 2 sprays into both nostrils daily., Disp: 16 g, Rfl: 4   furosemide (LASIX) 20 MG tablet, Take 1 tablet (20 mg total) by mouth daily., Disp: 90 tablet, Rfl: 4   gabapentin (NEURONTIN) 600 MG tablet, Take 0.5 tablets (300 mg total) by mouth at bedtime., Disp: 45 tablet, Rfl: 10   glucose blood (TRUE METRIX BLOOD GLUCOSE TEST) test strip, Use to check blood sugar 1-2 times daily., Disp: 100 each, Rfl: 12   lisinopril (ZESTRIL) 10 MG tablet, Take 1 tablet (10 mg total) by mouth daily., Disp: 90 tablet, Rfl: 4   meloxicam (MOBIC) 15 MG tablet, Take 1 tablet (15 mg total) by mouth daily as needed for pain. As needed, Disp: 90 tablet, Rfl: 1   metFORMIN (GLUCOPHAGE) 500 MG tablet, TAKE ONE-HALF TABLET BY MOUTH TWICE DAILY, Disp: 180 tablet, Rfl: 4   TRUEplus Lancets 33G MISC, Use to check blood sugar 1-3 times daily., Disp: 100 each, Rfl: 4   VITAMIN D PO, Take by mouth daily., Disp: , Rfl:   Observations/Objective: Patient is well-developed, well-nourished in no acute distress.  Resting comfortably  at home.  Head is normocephalic,  atraumatic.  No labored breathing.  Speech is clear and coherent with logical content.  Patient is alert and oriented at baseline.    Assessment and Plan: 1. Acute bacterial bronchitis - doxycycline (VIBRA-TABS) 100 MG tablet; Take 1 tablet (100 mg total) by mouth 2 (two) times daily.  Dispense: 14 tablet; Refill: 0 - benzonatate (TESSALON) 100 MG capsule; Take 1 capsule (100 mg total) by mouth 3 (three) times daily as needed for cough.  Dispense: 30 capsule; Refill: 0  Supportive measures and OTC medications reviewed.  Doxycycline and Tessalon per orders.  Restart albuterol.  Strict in person evaluation precautions discussed with patient.  Follow Up Instructions: I discussed the assessment and treatment plan with the patient. The patient was provided an opportunity to ask questions and all were answered. The patient agreed with the plan and demonstrated an understanding of the instructions.  A copy of  instructions were sent to the patient via MyChart unless otherwise noted below.   The patient was advised to call back or seek an in-person evaluation if the symptoms worsen or if the condition fails to improve as anticipated.  Time:  I spent 10 minutes with the patient via telehealth technology discussing the above problems/concerns.    Leeanne Rio, PA-C

## 2022-10-18 NOTE — Telephone Encounter (Signed)
Message from [place] sent at 10/18/2022  9:17 AM EDT  Summary: Pt requests Rx for cough medication   Pt requests that a Rx for a medication for cough be called in to her pharmacy. Cb# [phone]          Call History   Type Contact Phone/Fax User  10/18/2022 09:15 AM EDT Phone (Incoming) Yamari, Hedinger (Self) [phone] Rexene Edison) Marylen Ponto

## 2022-10-18 NOTE — Telephone Encounter (Signed)
Attempted to return her call.   It just rang and rang.   A voicemail machine did not pick up.   Unable to leave a voicemail.    Will try again later.

## 2022-10-18 NOTE — Telephone Encounter (Signed)
Pt returned our call.   Chief Complaint: Cough lingering Symptoms: above Frequency: 3 weeks Pertinent Negatives: Patient denies Fever, any other s/s Disposition: [] ED /[] Urgent Care (no appt availability in office) / [] Appointment(In office/virtual)/ []  [facility]/ [] Home Care/ [] Refused Recommended Disposition /[] [facility]/ []  Follow-up with PCP Additional Notes: Pt states that she has had a cough for 3 weeks w/o resolution. Pt has tried otc medications. No appts until Monday. Pt would like to do a vv, but needs to wait until co-worker returns to get her email address.  Pt will call back with email address. Please schedule vv. Pt may go to UC.

## 2022-10-18 NOTE — Telephone Encounter (Signed)
Pt. Called back with e-mail address. Set up for virtual visit through [facility].

## 2022-10-18 NOTE — Patient Instructions (Signed)
Milinda Antis, thank you for joining Piedad Climes, PA-C for today's virtual visit.  While this provider is not your primary care provider (PCP), if your PCP is located in our provider database this encounter information will be shared with them immediately following your visit.   A [facility] MyChart account gives you access to today's visit and all your visits, tests, and labs performed at [facility] " click here if you don't have a [facility] MyChart account or go to [email or web address]  Consent: (Patient) Jillian Pierce provided verbal consent for this virtual visit at the beginning of the encounter.  Current Medications:  Current Outpatient Medications:    benzonatate (TESSALON) 100 MG capsule, Take 1 capsule (100 mg total) by mouth 3 (three) times daily as needed for cough., Disp: 30 capsule, Rfl: 0   doxycycline (VIBRA-TABS) 100 MG tablet, Take 1 tablet (100 mg total) by mouth 2 (two) times daily., Disp: 14 tablet, Rfl: 0   albuterol (VENTOLIN HFA) 108 (90 Base) MCG/ACT inhaler, Inhale 2 puffs into the lungs every 6 (six) hours as needed for wheezing or shortness of breath., Disp: 9 g, Rfl: 1   amLODipine (NORVASC) 10 MG tablet, Take 1 tablet (10 mg total) by mouth daily., Disp: 90 tablet, Rfl: 4   Ascorbic Acid (VITAMIN C PO), Take by mouth daily., Disp: , Rfl:    aspirin EC 81 MG tablet, Take 81 mg by mouth daily., Disp: , Rfl:    atorvastatin (LIPITOR) 10 MG tablet, Take 1 tablet (10 mg total) by mouth daily., Disp: 90 tablet, Rfl: 4   Blood Glucose Monitoring Suppl (TRUE METRIX AIR GLUCOSE METER) w/Device KIT, Use to check blood sugar 1-3 times a day., Disp: 1 kit, Rfl: 1   cloNIDine (CATAPRES) 0.3 MG tablet, Take 1 tablet (0.3 mg total) by mouth 2 (two) times daily., Disp: 180 tablet, Rfl: 4   Cyanocobalamin (VITAMIN B 12 PO), Take by mouth daily., Disp: , Rfl:    fluticasone (FLONASE) 50 MCG/ACT nasal spray, Place 2 sprays into both nostrils daily., Disp:  16 g, Rfl: 4   furosemide (LASIX) 20 MG tablet, Take 1 tablet (20 mg total) by mouth daily., Disp: 90 tablet, Rfl: 4   gabapentin (NEURONTIN) 600 MG tablet, Take 0.5 tablets (300 mg total) by mouth at bedtime., Disp: 45 tablet, Rfl: 10   glucose blood (TRUE METRIX BLOOD GLUCOSE TEST) test strip, Use to check blood sugar 1-2 times daily., Disp: 100 each, Rfl: 12   lisinopril (ZESTRIL) 10 MG tablet, Take 1 tablet (10 mg total) by mouth daily., Disp: 90 tablet, Rfl: 4   meloxicam (MOBIC) 15 MG tablet, Take 1 tablet (15 mg total) by mouth daily as needed for pain. As needed, Disp: 90 tablet, Rfl: 1   metFORMIN (GLUCOPHAGE) 500 MG tablet, TAKE ONE-HALF TABLET BY MOUTH TWICE DAILY, Disp: 180 tablet, Rfl: 4   TRUEplus Lancets 33G MISC, Use to check blood sugar 1-3 times daily., Disp: 100 each, Rfl: 4   VITAMIN D PO, Take by mouth daily., Disp: , Rfl:    Medications ordered in this encounter:  Meds ordered this encounter  Medications   doxycycline (VIBRA-TABS) 100 MG tablet    Sig: Take 1 tablet (100 mg total) by mouth 2 (two) times daily.    Dispense:  14 tablet    Refill:  0    Order Specific Question:   Supervising Provider    Answer:   Merrilee Jansky [id number]   benzonatate (TESSALON)  100 MG capsule    Sig: Take 1 capsule (100 mg total) by mouth 3 (three) times daily as needed for cough.    Dispense:  30 capsule    Refill:  0    Order Specific Question:   Supervising Provider    Answer:   Merrilee Jansky [id number]     *If you need refills on other medications prior to your next appointment, please contact your pharmacy*  Follow-Up: Call back or seek an in-person evaluation if the symptoms worsen or if the condition fails to improve as anticipated.  [facility] [phone]  Other Instructions Take antibiotic (doxycycline) as directed.  Increase fluids.  Get plenty of rest. Use Mucinex for congestion.  Use the Tessalon as directed for cough.  Restart use of your  albuterol inhaler as directed, when needed. Take a daily probiotic (I recommend Align or Culturelle, but even Activia Yogurt may be beneficial).  A humidifier placed in the bedroom may offer some relief for a dry, scratchy throat of nasal irritation.  Read information below on acute bronchitis. Please call or return to clinic if symptoms are not improving.  Acute Bronchitis Bronchitis is when the airways that extend from the windpipe into the lungs get red, puffy, and painful (inflamed). Bronchitis often causes thick spit (mucus) to develop. This leads to a cough. A cough is the most common symptom of bronchitis. In acute bronchitis, the condition usually begins suddenly and goes away over time (usually in 2 weeks). Smoking, allergies, and asthma can make bronchitis worse. Repeated episodes of bronchitis may cause more lung problems.  HOME CARE Rest. Drink enough fluids to keep your pee (urine) clear or pale yellow (unless you need to limit fluids as told by your doctor). Only take over-the-counter or prescription medicines as told by your doctor. Avoid smoking and secondhand smoke. These can make bronchitis worse. If you are a smoker, think about using nicotine gum or skin patches. Quitting smoking will help your lungs heal faster. Reduce the chance of getting bronchitis again by: Washing your hands often. Avoiding people with cold symptoms. Trying not to touch your hands to your mouth, nose, or eyes. Follow up with your doctor as told.  GET HELP IF: Your symptoms do not improve after 1 week of treatment. Symptoms include: Cough. Fever. Coughing up thick spit. Body aches. Chest congestion. Chills. Shortness of breath. Sore throat.  GET HELP RIGHT AWAY IF:  You have an increased fever. You have chills. You have severe shortness of breath. You have bloody thick spit (sputum). You throw up (vomit) often. You lose too much body fluid (dehydration). You have a severe headache. You  faint.  MAKE SURE YOU:  Understand these instructions. Will watch your condition. Will get help right away if you are not doing well or get worse. Document Released: 09/25/2007 Document Revised: 12/09/2012 Document Reviewed: 09/29/2012 [facility] Patient Information 2015 [place], [place]. This information is not intended to replace advice given to you by your health care provider. Make sure you discuss any questions you have with your health care provider.    If you have been instructed to have an in-person evaluation today at a local Urgent Care facility, please use the link below. It will take you to a list of all of our available [facility], including address, phone number and hours of operation. Please do not delay care.  [facility]  If you or a family member do not have a  primary care provider, use the link below to schedule a visit and establish care. When you choose a [facility] primary care physician or advanced practice provider, you gain a long-term partner in health. Find a Primary Care Provider  Learn more about [facility]'s in-office and virtual care options: [facility]

## 2022-10-18 NOTE — Telephone Encounter (Signed)
Patient called, 3rd attempt left VM to return the call to the office to speak to the NT.    Summary: Pt requests Rx for cough medication   Pt requests that a Rx for a medication for cough be called in to her pharmacy. Cb# [phone]

## 2022-10-18 NOTE — Telephone Encounter (Signed)
2nd attempt to return her call.   No voicemail picked up.

## 2022-10-18 NOTE — Telephone Encounter (Signed)
Pt returned our call. Pt was not there when agent attempted to connect with NT.

## 2022-10-28 ENCOUNTER — Ambulatory Visit: Payer: Self-pay | Admitting: *Deleted

## 2022-10-28 ENCOUNTER — Ambulatory Visit: Payer: Self-pay

## 2022-10-28 NOTE — Telephone Encounter (Signed)
Third attempt to reach pt. No answer unable to leave a message.

## 2022-10-28 NOTE — Telephone Encounter (Signed)
  Pt is calling to report that she was evaluated on 10/18/22 for a cough. Pt reports that the medication is not working. Please advise.      No answer.

## 2022-10-28 NOTE — Telephone Encounter (Signed)
  Chief Complaint: continued cough since 10/18/22 UC VV requesting medication  Symptoms: continued cough not getting better. Dry , tessalon medication not working. Took doxycyline prescribed 10/18/22 UC VV. Frequency: prior to 10/18/22 Pertinent Negatives: Patient denies difficulty breathing no productive cough  Disposition: [] ED /[] Urgent Care (no appt availability in office) / [x] Appointment(In office/virtual)/ []  [facility]/ [] Home Care/ [] Refused Recommended Disposition /[] [facility]/ []  Follow-up with PCP Additional Notes:   No available appt with PCP with in 3 days. Appt scheduled 11/01/22. Please advise if appt needed or if alternative medication can be given.      Reason for Disposition  Cough has been present for > 3 weeks    Since 10/18/22  Answer Assessment - Initial Assessment Questions 1. ONSET: "When did the cough begin?"      Prior to 10/18/22 2. SEVERITY: "How bad is the cough today?"      Not getting better 3. SPUTUM: "Describe the color of your sputum" (none, dry cough; clear, white, yellow, green)     Dry  4. HEMOPTYSIS: "Are you coughing up any blood?" If so ask: "How much?" (flecks, streaks, tablespoons, etc.)     Na  5. DIFFICULTY BREATHING: "Are you having difficulty breathing?" If Yes, ask: "How bad is it?" (e.g., mild, moderate, severe)    - MILD: No SOB at rest, mild SOB with walking, speaks normally in sentences, can lie down, no retractions, pulse < 100.    - MODERATE: SOB at rest, SOB with minimal exertion and prefers to sit, cannot lie down flat, speaks in phrases, mild retractions, audible wheezing, pulse 100-120.    - SEVERE: Very SOB at rest, speaks in single words, struggling to breathe, sitting hunched forward, retractions, pulse > 120      Denies  6. FEVER: "Do you have a fever?" If Yes, ask: "What is your temperature, how was it measured, and when did it start?"     na 7. CARDIAC HISTORY: "Do you have any history of heart  disease?" (e.g., heart attack, congestive heart failure)      Na  8. LUNG HISTORY: "Do you have any history of lung disease?"  (e.g., pulmonary embolus, asthma, emphysema)     No  9. PE RISK FACTORS: "Do you have a history of blood clots?" (or: recent major surgery, recent prolonged travel, bedridden)     na 10. OTHER SYMPTOMS: "Do you have any other symptoms?" (e.g., runny nose, wheezing, chest pain)       Dry cough not getting better with tessalon and course of doxycyline 11. PREGNANCY: "Is there any chance you are pregnant?" "When was your last menstrual period?"       na 12. TRAVEL: "Have you traveled out of the country in the last month?" (e.g., travel history, exposures)       na  Protocols used: Cough - Acute Non-Productive-A-AH

## 2022-10-28 NOTE — Telephone Encounter (Signed)
Patient called, no answer, unable to leave VM. 2nd attempt.  Summary: Cough Advice   Pt is calling to report that she was evaluated on 10/18/22 for a cough. Pt reports that the medication is not working. Please advise.

## 2022-11-01 ENCOUNTER — Encounter: Payer: Self-pay | Admitting: Physician Assistant

## 2022-11-01 ENCOUNTER — Ambulatory Visit (INDEPENDENT_AMBULATORY_CARE_PROVIDER_SITE_OTHER): Payer: Medicare PPO | Admitting: Physician Assistant

## 2022-11-01 VITALS — BP 140/83 | HR 89 | Ht 60.25 in | Wt 258.6 lb

## 2022-11-01 DIAGNOSIS — R058 Other specified cough: Secondary | ICD-10-CM

## 2022-11-01 MED ORDER — PREDNISONE 20 MG PO TABS: ORAL_TABLET | ORAL | 0 refills | Status: DC

## 2022-11-01 NOTE — Progress Notes (Signed)
Acute Office Visit   Patient: Jillian Pierce   DOB: 27-Jan-1956   67 y.o. Female  MRN: [id number] Visit Date: 11/01/2022  Today's healthcare provider: Oswaldo Conroy Srihaan Mastrangelo, PA-C  Introduced myself to the patient as a Secondary school teacher and provided education on APPs in clinical practice.    Chief Complaint  Patient presents with   Cough    Patient says she has been had her cough for about a month and a half. Patient says it is not getting any better. Patient says she had an appointment virtually and was prescribed some medications a couple of weeks ago and it did not help. Patient says she isn't coughing up anything, but feels something is there.    Subjective    HPI HPI     Cough    Additional comments: Patient says she has been had her cough for about a month and a half. Patient says it is not getting any better. Patient says she had an appointment virtually and was prescribed some medications a couple of weeks ago and it did not help. Patient says she isn't coughing up anything, but feels something is there.       Last edited by Malen Gauze, CMA on 11/01/2022  9:25 AM.      Cough States it has been ongoing for about a month  She states it is nonproductive   She states she had a cold or sinus infection last month and the cough has persisted  She states the cough is persistent throughout the day and night   Interventions: She was given Doxycycline and tessalon pearls on 10/18/22 and was instructed to use her inhaler for SOB  She has been taking Tylenol since finishing the round of medications above She has tried taking Delsym every 12 hours  She is using her inhaler at night before bed and usually once during the day - she states it helps for a little bit  She is still using Flonase and Claritin daily   She does take Lisinopril daily   She reports she checks her glucose daily - fastings have been 120s-130s     Medications: Outpatient Medications Prior to Visit  Medication Sig    albuterol (VENTOLIN HFA) 108 (90 Base) MCG/ACT inhaler Inhale 2 puffs into the lungs every 6 (six) hours as needed for wheezing or shortness of breath.   amLODipine (NORVASC) 10 MG tablet Take 1 tablet (10 mg total) by mouth daily.   Ascorbic Acid (VITAMIN C PO) Take by mouth daily.   aspirin EC 81 MG tablet Take 81 mg by mouth daily.   atorvastatin (LIPITOR) 10 MG tablet Take 1 tablet (10 mg total) by mouth daily.   Blood Glucose Monitoring Suppl (TRUE METRIX AIR GLUCOSE METER) w/Device KIT Use to check blood sugar 1-3 times a day.   cloNIDine (CATAPRES) 0.3 MG tablet Take 1 tablet (0.3 mg total) by mouth 2 (two) times daily.   Cyanocobalamin (VITAMIN B 12 PO) Take by mouth daily.   fluticasone (FLONASE) 50 MCG/ACT nasal spray Place 2 sprays into both nostrils daily.   furosemide (LASIX) 20 MG tablet Take 1 tablet (20 mg total) by mouth daily.   gabapentin (NEURONTIN) 600 MG tablet Take 0.5 tablets (300 mg total) by mouth at bedtime.   glucose blood (TRUE METRIX BLOOD GLUCOSE TEST) test strip Use to check blood sugar 1-2 times daily.   lisinopril (ZESTRIL) 10 MG tablet Take 1 tablet (10 mg total)  by mouth daily.   meloxicam (MOBIC) 15 MG tablet Take 1 tablet (15 mg total) by mouth daily as needed for pain. As needed   metFORMIN (GLUCOPHAGE) 500 MG tablet TAKE ONE-HALF TABLET BY MOUTH TWICE DAILY   TRUEplus Lancets 33G MISC Use to check blood sugar 1-3 times daily.   VITAMIN D PO Take by mouth daily.   benzonatate (TESSALON) 100 MG capsule Take 1 capsule (100 mg total) by mouth 3 (three) times daily as needed for cough. (Patient not taking: Reported on 11/01/2022)   doxycycline (VIBRA-TABS) 100 MG tablet Take 1 tablet (100 mg total) by mouth 2 (two) times daily. (Patient not taking: Reported on 11/01/2022)   No facility-administered medications prior to visit.    Review of Systems  Constitutional:  Positive for chills and fatigue. Negative for fever.  HENT:  Negative for congestion, ear  pain, postnasal drip, sinus pressure, sinus pain and sore throat.   Respiratory:  Positive for cough and shortness of breath. Negative for wheezing.   Cardiovascular:  Negative for chest pain.  Neurological:  Negative for dizziness and headaches.       Objective    BP (!) 140/83   Pulse 89   Ht 5' 0.25" (1.53 m)   Wt 258 lb 9.6 oz (117.3 kg)   SpO2 98%   BMI 50.09 kg/m    Physical Exam Vitals reviewed.  Constitutional:      General: She is awake.     Appearance: Normal appearance. She is well-developed and well-groomed.  HENT:     Head: Normocephalic and atraumatic.  Cardiovascular:     Rate and Rhythm: Normal rate and regular rhythm.     Heart sounds: Normal heart sounds. No murmur heard.    No friction rub. No gallop.  Pulmonary:     Effort: Pulmonary effort is normal.     Breath sounds: Normal breath sounds. No decreased air movement. No decreased breath sounds, wheezing, rhonchi or rales.  Musculoskeletal:     Cervical back: Normal range of motion and neck supple.  Skin:    General: Skin is warm and dry.  Neurological:     General: No focal deficit present.     Mental Status: She is alert and oriented to person, place, and time.  Psychiatric:        Mood and Affect: Mood normal.        Behavior: Behavior normal. Behavior is cooperative.        Thought Content: Thought content normal.        Judgment: Judgment normal.       No results found for any visits on 11/01/22.  Assessment & Plan      No follow-ups on file.       Problem List Items Addressed This Visit   None Visit Diagnoses     Post-viral cough syndrome    -  Primary Acute, ongoing concern Patient reports that last month she had a cold or some other URI and since then she has had persistent cough Reviewed the notes from her telehealth visit on 10/18/2022 during which she was given doxycycline Tessalon Perles that she reports minimal improvement after completing this regimen Physical exam is  overall benign minus persistent cough during appointment Suspect postviral cough syndrome at this time Patient does not have diagnosis of asthma but I cannot rule out asthma exacerbation today Will provide prednisone taper to assist with inflammation  Reviewed most recent A1c as well as fasting blood sugars-appears okay to  start prednisone reviewed importance of using albuterol inhaler as needed for flares If not improving after she finishes the prednisone taper recommend follow-up in office to reassess lungs.  If symptoms have not improved by that time we will send for chest x-ray Follow-up as needed for persisting or progressing symptoms    Relevant Medications   predniSONE (DELTASONE) 20 MG tablet        No follow-ups on file.   I, Devynn Hessler E Alexandera Kuntzman, PA-C, have reviewed all documentation for this visit. The documentation on 11/01/22 for the exam, diagnosis, procedures, and orders are all accurate and complete.   Jacquelin Hawking, MHS, PA-C [facility]

## 2022-11-01 NOTE — Patient Instructions (Addendum)
    I have sent in a script for Prednisone taper to be taken in the morning with breakfast per the instructions on the container  Remember that steroids can cause sleeplessness, irritability, increased hunger and elevated glucose levels so be mindful of these side effects. They should lessen as you progress to the lower doses of the taper.  Please continue to use your albuterol inhaler as needed for coughing and shortness of breath  You can take Robitussin as directed  to help with the coughing   If you cough is not improving after finishing the Prednisone, or getting worse, please come back so we can evaluate further.

## 2022-11-06 DIAGNOSIS — H2513 Age-related nuclear cataract, bilateral: Secondary | ICD-10-CM | POA: Diagnosis not present

## 2022-11-06 LAB — HM DIABETES EYE EXAM

## 2022-11-07 ENCOUNTER — Encounter: Payer: Self-pay | Admitting: Nurse Practitioner

## 2022-11-30 NOTE — Patient Instructions (Incomplete)
IMAGING LOCATION: [id number], [place], [place] [address] [phone]   Be Involved in Caring For Your Health:  Taking Medications When medications are taken as directed, they can greatly improve your health. But if they are not taken as prescribed, they may not work. In some cases, not taking them correctly can be harmful. To help ensure your treatment remains effective and safe, understand your medications and how to take them. Bring your medications to each visit for review by your provider.  Your lab results, notes, and after visit summary will be available on My Chart. We strongly encourage you to use this feature. If lab results are abnormal the clinic will contact you with the appropriate steps. If the clinic does not contact you assume the results are satisfactory. You can always view your results on My Chart. If you have questions regarding your health or results, please contact the clinic during office hours. You can also ask questions on My Chart.  We at [facility] are grateful that you chose [place] to provide your care. We strive to provide evidence-based and compassionate care and are always looking for feedback. If you get a survey from the clinic please complete this so we can hear your opinions.  Diabetes Mellitus Basics  Diabetes mellitus, or diabetes, is a long-term (chronic) disease. It occurs when the body does not properly use sugar (glucose) that is released from food after you eat. Diabetes mellitus may be caused by one or both of these problems: Your pancreas does not make enough of a hormone called insulin. Your body does not react in a normal way to the insulin that it makes. Insulin lets glucose enter cells in your body. This gives you energy. If you have diabetes, glucose cannot get into cells. This causes high blood glucose (hyperglycemia). How to treat and manage diabetes You may need to take insulin or other diabetes medicines daily to keep  your glucose in balance. If you are prescribed insulin, you will learn how to give yourself insulin by injection. You may need to adjust the amount of insulin you take based on the foods that you eat. You will need to check your blood glucose levels using a glucose monitor as told by your health care provider. The readings can help determine if you have low or high blood glucose. Generally, you should have these blood glucose levels: Before meals (preprandial): 80-130 mg/dL (5.1-0.2 mmol/L). After meals (postprandial): below 180 mg/dL (10 mmol/L). Hemoglobin A1c (HbA1c) level: less than 7%. Your health care provider will set treatment goals for you. Keep all follow-up visits. This is important. Follow these instructions at home: Diabetes medicines Take your diabetes medicines every day as told by your health care provider. List your diabetes medicines here: Name of medicine: ______________________________ Amount (dose): _______________ Time (a.m./p.m.): _______________ Notes: ___________________________________ Name of medicine: ______________________________ Amount (dose): _______________ Time (a.m./p.m.): _______________ Notes: ___________________________________ Name of medicine: ______________________________ Amount (dose): _______________ Time (a.m./p.m.): _______________ Notes: ___________________________________ Insulin If you use insulin, list the types of insulin you use here: Insulin type: ______________________________ Amount (dose): _______________ Time (a.m./p.m.): _______________Notes: ___________________________________ Insulin type: ______________________________ Amount (dose): _______________ Time (a.m./p.m.): _______________ Notes: ___________________________________ Insulin type: ______________________________ Amount (dose): _______________ Time (a.m./p.m.): _______________ Notes: ___________________________________ Insulin type: ______________________________ Amount  (dose): _______________ Time (a.m./p.m.): _______________ Notes: ___________________________________ Insulin type: ______________________________ Amount (dose): _______________ Time (a.m./p.m.): _______________ Notes: ___________________________________ Managing blood glucose  Check your blood glucose levels using a glucose monitor as told by your health care provider. Write  down the times that you check your glucose levels here: Time: _______________ Notes: ___________________________________ Time: _______________ Notes: ___________________________________ Time: _______________ Notes: ___________________________________ Time: _______________ Notes: ___________________________________ Time: _______________ Notes: ___________________________________ Time: _______________ Notes: ___________________________________  Low blood glucose Low blood glucose (hypoglycemia) is when glucose is at or below 70 mg/dL (3.9 mmol/L). Symptoms may include: Feeling: Hungry. Sweaty and clammy. Irritable or easily upset. Dizzy. Sleepy. Having: A fast heartbeat. A headache. A change in your vision. Numbness around the mouth, lips, or tongue. Having trouble with: Moving (coordination). Sleeping. Treating low blood glucose To treat low blood glucose, eat or drink something containing sugar right away. If you can think clearly and swallow safely, follow the 15:15 rule: Take 15 grams of a fast-acting carb (carbohydrate), as told by your health care provider. Some fast-acting carbs are: Glucose tablets: take 3-4 tablets. Hard candy: eat 3-5 pieces. Fruit juice: drink 4 oz (120 mL). Regular (not diet) soda: drink 4-6 oz (120-180 mL). Honey or sugar: eat 1 Tbsp (15 mL). Check your blood glucose levels 15 minutes after you take the carb. If your glucose is still at or below 70 mg/dL (3.9 mmol/L), take 15 grams of a carb again. If your glucose does not go above 70 mg/dL (3.9 mmol/L) after 3 tries, get help  right away. After your glucose goes back to normal, eat a meal or a snack within 1 hour. Treating very low blood glucose If your glucose is at or below 54 mg/dL (3 mmol/L), you have very low blood glucose (severe hypoglycemia). This is an emergency. Do not wait to see if the symptoms will go away. Get medical help right away. Call your local emergency services (911 in the [place] Do not drive yourself to the hospital. Questions to ask your health care provider Should I talk with a diabetes educator? What equipment will I need to care for myself at home? What diabetes medicines do I need? When should I take them? How often do I need to check my blood glucose levels? What number can I call if I have questions? When is my follow-up visit? Where can I find a support group for people with diabetes? Where to find more information American Diabetes Association: www.diabetes.org Association of Diabetes Care and Education Specialists: www.diabeteseducator.org Contact a health care provider if: Your blood glucose is at or above 240 mg/dL (16.1 mmol/L) for 2 days in a row. You have been sick or have had a fever for 2 days or more, and you are not getting better. You have any of these problems for more than 6 hours: You cannot eat or drink. You feel nauseous. You vomit. You have diarrhea. Get help right away if: Your blood glucose is lower than 54 mg/dL (3 mmol/L). You get confused. You have trouble thinking clearly. You have trouble breathing. These symptoms may represent a serious problem that is an emergency. Do not wait to see if the symptoms will go away. Get medical help right away. Call your local emergency services (911 in the [place] Do not drive yourself to the hospital. Summary Diabetes mellitus is a chronic disease that occurs when the body does not properly use sugar (glucose) that is released from food after you eat. Take insulin and diabetes medicines as told. Check your blood  glucose every day, as often as told. Keep all follow-up visits. This is important. This information is not intended to replace advice given to you by your health care provider. Make sure you discuss any questions you have  with your health care provider. Document Revised: 08/10/2019 Document Reviewed: 08/10/2019 Elsevier Patient Education  2024 [employer].

## 2022-12-03 ENCOUNTER — Encounter: Payer: Self-pay | Admitting: Nurse Practitioner

## 2022-12-03 ENCOUNTER — Ambulatory Visit (INDEPENDENT_AMBULATORY_CARE_PROVIDER_SITE_OTHER): Payer: Medicare PPO | Admitting: Nurse Practitioner

## 2022-12-03 VITALS — BP 132/73 | HR 78 | Temp 98.3°F | Ht 60.3 in | Wt 259.8 lb

## 2022-12-03 DIAGNOSIS — E1140 Type 2 diabetes mellitus with diabetic neuropathy, unspecified: Secondary | ICD-10-CM | POA: Diagnosis not present

## 2022-12-03 DIAGNOSIS — R600 Localized edema: Secondary | ICD-10-CM

## 2022-12-03 DIAGNOSIS — R011 Cardiac murmur, unspecified: Secondary | ICD-10-CM | POA: Diagnosis not present

## 2022-12-03 DIAGNOSIS — I152 Hypertension secondary to endocrine disorders: Secondary | ICD-10-CM | POA: Diagnosis not present

## 2022-12-03 DIAGNOSIS — E1169 Type 2 diabetes mellitus with other specified complication: Secondary | ICD-10-CM | POA: Diagnosis not present

## 2022-12-03 DIAGNOSIS — E785 Hyperlipidemia, unspecified: Secondary | ICD-10-CM

## 2022-12-03 DIAGNOSIS — E1159 Type 2 diabetes mellitus with other circulatory complications: Secondary | ICD-10-CM

## 2022-12-03 DIAGNOSIS — E1129 Type 2 diabetes mellitus with other diabetic kidney complication: Secondary | ICD-10-CM

## 2022-12-03 DIAGNOSIS — R809 Proteinuria, unspecified: Secondary | ICD-10-CM | POA: Diagnosis not present

## 2022-12-03 DIAGNOSIS — R052 Subacute cough: Secondary | ICD-10-CM

## 2022-12-03 DIAGNOSIS — Z7984 Long term (current) use of oral hypoglycemic drugs: Secondary | ICD-10-CM

## 2022-12-03 LAB — BAYER DCA HB A1C WAIVED: HB A1C (BAYER DCA - WAIVED): 6.9 % — ABNORMAL HIGH (ref 4.8–5.6)

## 2022-12-03 MED ORDER — OLMESARTAN MEDOXOMIL 20 MG PO TABS: 20.0000 mg | ORAL_TABLET | Freq: Every day | ORAL | 4 refills | Status: DC

## 2022-12-03 MED ORDER — AMOXICILLIN-POT CLAVULANATE 875-125 MG PO TABS: 1.0000 | ORAL_TABLET | Freq: Two times a day (BID) | ORAL | 0 refills | Status: AC

## 2022-12-03 MED ORDER — HYDROCOD POLI-CHLORPHE POLI ER 10-8 MG/5ML PO SUER: 5.0000 mL | Freq: Every evening | ORAL | 0 refills | Status: DC | PRN

## 2022-12-03 NOTE — Assessment & Plan Note (Signed)
With aortic sclerosis and moderate LVH past echo.  Asymptomatic at this time.  Consider cardiology referral if any worsening heart function.

## 2022-12-03 NOTE — Assessment & Plan Note (Addendum)
Ongoing for weeks at this time.  Will trial changing ACE to ARB, Olmesartan 20 MG.  Discussed this with patient.  Obtain CXR to ensure no PNA or fluid overload.  Recent echo was stable.  Start Augmentin BID and Tussionex as needed.  Continue all current at home regimen.  Return in 2 weeks.

## 2022-12-03 NOTE — Assessment & Plan Note (Signed)
Chronic, ongoing.  Continue current medication regimen and adjust as needed.  Lipid panel today.  Return in 3 months. 

## 2022-12-03 NOTE — Assessment & Plan Note (Signed)
Chronic, stable.  BP at goal in office today, however has an ongoing hacking cough.  Will trial changing ACE (Lisinopril) to ARB (Olmesartan 20 MG daily), discussed with patient this change and educated on this.  Recommend checking BP at home three mornings a week and documenting for provider + focus on DASH diet.  LABS: BMP.  Refills up to date.

## 2022-12-03 NOTE — Assessment & Plan Note (Signed)
Chronic, ongoing with recent A1c levels below goal, today 6.9%.  Urine ALB 68 February 2024, changing to ARB due to current ongoing cough.  Continue current medication regimen and adjust as needed, although discussed with her possibly stopping Metformin and changing to a GLP1 to assist with her weight loss goals, she is not interested in this at this time. Continue daily B12 supplement for low levels in past.  Continue Gabapentin at night.  Return in 3 months.

## 2022-12-03 NOTE — Assessment & Plan Note (Signed)
Chronic, ongoing with recent A1c levels below goal, today 6.9%.  Urine ALB 68 February 2024, changing to ARB due to current ongoing cough.  Continue current medication regimen and adjust as needed, although discussed with her possibly stopping Metformin and changing to a GLP1 to assist with her weight loss goals, she is not interested in this at this time. Continue daily B12 supplement for low levels in past.  Continue Gabapentin at night.  Return in 3 months. - Foot and eye exams up to date - ARB and statin on board - Vaccinations up to date.

## 2022-12-03 NOTE — Assessment & Plan Note (Signed)
Ongoing, she is on Amlodipine and Gabapentin, discussed with her.  ?lymphedema.  Recommend compression hose on during day and off at night.

## 2022-12-03 NOTE — Assessment & Plan Note (Signed)
BMI 50.24, with T2DM and HTN/HLD.  Recommended eating smaller high protein, low fat meals more frequently and exercising 30 mins a day 5 times a week with a goal of 10-15lb weight loss in the next 3 months. Patient voiced their understanding and motivation to adhere to these recommendations.

## 2022-12-03 NOTE — Progress Notes (Signed)
BP 132/73   Pulse 78   Temp 98.3 F (36.8 C) (Oral)   Ht 5' 0.3" (1.532 m)   Wt 259 lb 12.8 oz (117.8 kg)   SpO2 97%   BMI 50.24 kg/m    Subjective:    Patient ID: Jillian Pierce, female    DOB: [birth year], 67 y.o.   MRN: [id number]  HPI: Jillian Pierce is a 67 y.o. female  Chief Complaint  Patient presents with   Diabetes   Hyperlipidemia   Hypertension   Osteopenia   DIABETES Last A1c 7.1% February.  Continues on Metformin 250 MG BID.  Takes Gabapentin 300 MG at bedtime for ongoing back & hip pain.  Continues on Vitamin D and B12 at home for history of low levels.  Does get burning to lower legs at times. Hypoglycemic episodes:no Polydipsia/polyuria: no Visual disturbance: no Chest pain: no Paresthesias: no Glucose Monitoring: no             Accucheck frequency: every morning             Fasting glucose: 131 this morning -- average 130 range             Post prandial:             Evening:             Before meals: Taking Insulin?: no             Long acting insulin:             Short acting insulin: Blood Pressure Monitoring: not checking Retinal Examination: Up to Date -- [facility] Foot Exam: Up to Date Pneumovax: Up to Date Influenza: Up to Date Aspirin: yes    HYPERTENSION / HYPERLIPIDEMIA Continues on Lipitor and Norvasc, Lisinopril, Clonidine, and Lasix.   Last echo on 09/26/21 noted EF 60-65% and moderate LVH with mild aortic valve regurgitation.  At baseline she sleeps in recliner, lying down, but denies orthopnea. Satisfied with current treatment? yes Duration of hypertension: chronic BP monitoring frequency: not checking BP range:  BP medication side effects: no Duration of hyperlipidemia: chronic Cholesterol medication side effects: no Cholesterol supplements: none Medication compliance: good compliance Aspirin: yes Recent stressors: no Recurrent headaches: no Visual changes: no Palpitations: no Dyspnea: at present with ongoing cough  from URI Chest pain: no Lower extremity edema: at baseline - wears compression Dizzy/lightheaded: no  COUGH Started on 11/01/22, was seen in office at time.  Was given Prednisone and Doxycycline.  Gotten a little better, but not 100%.  Still having coughing spells.  Is using Albuterol as needed. Duration: weeks Circumstances of initial development of cough: URI Cough severity: moderate Cough description: productive Aggravating factors:  worse in the AM Alleviating factors: nothing Status:  fluctuating Treatments attempted: Doxycycline, Prednisone, [employer] cough syrup and Robitussin + taking allergy pill from [employer] Wheezing: yes Shortness of breath: yes at times Chest pain: no Chest tightness:no Nasal congestion: no Runny nose: yes Postnasal drip: yes Frequent throat clearing or swallowing: yes Hemoptysis: no Fevers: no Night sweats: no Weight loss: no Heartburn: no Recent foreign travel: no Tuberculosis contacts: no      12/03/2022    9:08 AM 11/01/2022    9:27 AM 07/02/2022   11:04 AM 06/04/2022    8:59 AM 02/20/2022    9:58 AM  Depression screen PHQ 2/9  Decreased Interest 0 0 0 0 0  Down, Depressed, Hopeless 0 0 0 0 0  PHQ -  2 Score 0 0 0 0 0  Altered sleeping 1 2 0 1 0  Tired, decreased energy 1 2 0 1 0  Change in appetite 0 0 0 0 0  Feeling bad or failure about yourself  0 0 0 0 0  Trouble concentrating 0 0 0 0 0  Moving slowly or fidgety/restless 0 0 0 0 0  Suicidal thoughts 0 0 0 0 0  PHQ-9 Score 2 4 0 2 0  Difficult doing work/chores Not difficult at all Not difficult at all Not difficult at all Not difficult at all        12/03/2022    9:09 AM 11/01/2022    9:27 AM 06/04/2022    9:00 AM 02/20/2022    9:58 AM  GAD 7 : Generalized Anxiety Score  Nervous, Anxious, on Edge 0 0 0 0  Control/stop worrying 0 0 0 0  Worry too much - different things 0 0 0 0  Trouble relaxing 0 1 0 1  Restless 0 0 0 0  Easily annoyed or irritable 0 0 0 0  Afraid - awful  might happen 0 0 0 0  Total GAD 7 Score 0 1 0 1  Anxiety Difficulty Not difficult at all  Not difficult at all    Relevant past medical, surgical, family and social history reviewed and updated as indicated. Interim medical history since our last visit reviewed. Allergies and medications reviewed and updated.  Review of Systems  Constitutional:  Negative for activity change, appetite change, diaphoresis, fatigue and fever.  Respiratory:  Positive for shortness of breath (at baseline, no worsening). Negative for cough, chest tightness and wheezing.   Cardiovascular:  Positive for leg swelling (at baseline). Negative for chest pain and palpitations.  Gastrointestinal: Negative.   Endocrine: Negative for cold intolerance, heat intolerance, polydipsia, polyphagia and polyuria.  Neurological: Negative.   Psychiatric/Behavioral: Negative.      Per HPI unless specifically indicated above     Objective:    BP 132/73   Pulse 78   Temp 98.3 F (36.8 C) (Oral)   Ht 5' 0.3" (1.532 m)   Wt 259 lb 12.8 oz (117.8 kg)   SpO2 97%   BMI 50.24 kg/m   Wt Readings from Last 3 Encounters:  12/03/22 259 lb 12.8 oz (117.8 kg)  11/01/22 258 lb 9.6 oz (117.3 kg)  07/02/22 252 lb (114.3 kg)    Physical Exam Vitals and nursing note reviewed.  Constitutional:      General: She is awake. She is not in acute distress.    Appearance: She is well-developed and well-groomed. She is morbidly obese. She is not ill-appearing.  HENT:     Head: Normocephalic.     Right Ear: Hearing normal.     Left Ear: Hearing normal.  Eyes:     General: Lids are normal.        Right eye: No discharge.        Left eye: No discharge.     Conjunctiva/sclera: Conjunctivae normal.     Pupils: Pupils are equal, round, and reactive to light.  Neck:     Thyroid: No thyromegaly.     Vascular: No carotid bruit.  Cardiovascular:     Rate and Rhythm: Normal rate and regular rhythm.     Heart sounds: Murmur heard.      Systolic murmur is present with a grade of 2/6.     No gallop.  Pulmonary:     Effort: Pulmonary effort  is normal. No accessory muscle usage or respiratory distress.     Breath sounds: Normal breath sounds.  Abdominal:     General: Bowel sounds are normal.     Palpations: Abdomen is soft.  Musculoskeletal:     Cervical back: Normal range of motion and neck supple.     Right lower leg: 2+ Pitting Edema present.     Left lower leg: 2+ Pitting Edema present.  Skin:    General: Skin is warm and dry.  Neurological:     Mental Status: She is alert and oriented to person, place, and time.  Psychiatric:        Attention and Perception: Attention normal.        Mood and Affect: Mood normal.        Speech: Speech normal.        Behavior: Behavior normal. Behavior is cooperative.        Thought Content: Thought content normal.    Results for orders placed or performed in visit on 12/03/22  Bayer DCA Hb A1c Waived  Result Value Ref Range   HB A1C (BAYER DCA - WAIVED) 6.9 (H) 4.8 - 5.6 %      Assessment & Plan:   Problem List Items Addressed This Visit       Cardiovascular and Mediastinum   Hypertension associated with diabetes (HCC)    Chronic, stable.  BP at goal in office today, however has an ongoing hacking cough.  Will trial changing ACE (Lisinopril) to ARB (Olmesartan 20 MG daily), discussed with patient this change and educated on this.  Recommend checking BP at home three mornings a week and documenting for provider + focus on DASH diet.  LABS: BMP.  Refills up to date.        Relevant Medications   olmesartan (BENICAR) 20 MG tablet   Other Relevant Orders   Bayer DCA Hb A1c Waived (Completed)   Basic metabolic panel     Endocrine   Diabetes mellitus with proteinuria (HCC)    Chronic, ongoing with recent A1c levels below goal, today 6.9%.  Urine ALB 68 February 2024, changing to ARB due to current ongoing cough.  Continue current medication regimen and adjust as needed,  although discussed with her possibly stopping Metformin and changing to a GLP1 to assist with her weight loss goals, she is not interested in this at this time. Continue daily B12 supplement for low levels in past.  Continue Gabapentin at night.  Return in 3 months.      Relevant Medications   olmesartan (BENICAR) 20 MG tablet   Other Relevant Orders   Bayer DCA Hb A1c Waived (Completed)   Basic metabolic panel   Hyperlipidemia associated with type 2 diabetes mellitus (HCC)    Chronic, ongoing.  Continue current medication regimen and adjust as needed.  Lipid panel today. Return in 3 months.        Relevant Medications   olmesartan (BENICAR) 20 MG tablet   Other Relevant Orders   Bayer DCA Hb A1c Waived (Completed)   Lipid Panel w/o Chol/HDL Ratio   Type 2 diabetes mellitus with diabetic neuropathy, unspecified (HCC) - Primary    Chronic, ongoing with recent A1c levels below goal, today 6.9%.  Urine ALB 68 February 2024, changing to ARB due to current ongoing cough.  Continue current medication regimen and adjust as needed, although discussed with her possibly stopping Metformin and changing to a GLP1 to assist with her weight loss goals, she is  not interested in this at this time. Continue daily B12 supplement for low levels in past.  Continue Gabapentin at night.  Return in 3 months. - Foot and eye exams up to date - ARB and statin on board - Vaccinations up to date.      Relevant Medications   olmesartan (BENICAR) 20 MG tablet   Other Relevant Orders   Bayer DCA Hb A1c Waived (Completed)   Basic metabolic panel     Other   Edema of both legs    Ongoing, she is on Amlodipine and Gabapentin, discussed with her.  ?lymphedema.  Recommend compression hose on during day and off at night.        Heart murmur    With aortic sclerosis and moderate LVH past echo.  Asymptomatic at this time.  Consider cardiology referral if any worsening heart function.      Morbid obesity (HCC)     BMI 50.24, with T2DM and HTN/HLD.  Recommended eating smaller high protein, low fat meals more frequently and exercising 30 mins a day 5 times a week with a goal of 10-15lb weight loss in the next 3 months. Patient voiced their understanding and motivation to adhere to these recommendations.       Subacute cough    Ongoing for weeks at this time.  Will trial changing ACE to ARB, Olmesartan 20 MG.  Discussed this with patient.  Obtain CXR to ensure no PNA or fluid overload.  Recent echo was stable.  Start Augmentin BID and Tussionex as needed.  Continue all current at home regimen.  Return in 2 weeks.      Relevant Orders   DG Chest 2 View     Follow up plan: Return in about 2 weeks (around 12/17/2022) for Cough recheck.

## 2022-12-04 NOTE — Progress Notes (Signed)
Contacted via MyChart   Good afternoon Jillian Pierce, your labs have returned and these are overall stable.  No changes needed.  Any questions? Keep being amazing!!  Thank you for allowing me to participate in your care.  I appreciate you. Kindest regards, 

## 2022-12-05 ENCOUNTER — Telehealth: Payer: Self-pay | Admitting: Nurse Practitioner

## 2022-12-05 MED ORDER — VALSARTAN 40 MG PO TABS: 40.0000 mg | ORAL_TABLET | Freq: Every day | ORAL | 4 refills | Status: DC

## 2022-12-05 NOTE — Telephone Encounter (Signed)
Routing to provider to advise.  

## 2022-12-05 NOTE — Telephone Encounter (Signed)
Pt is calling in because her insurance won't cover olmesartan (BENICAR) 20 MG tablet [[id number]] chlorpheniramine-HYDROcodone (TUSSIONEX) 10-8 MG/5ML [[id number]] and she wants to know can she be prescribed something else. Please follow up with pt.

## 2022-12-06 NOTE — Telephone Encounter (Signed)
Called and notified patient of Jolene's message. Patient verbalized understanding.

## 2022-12-09 ENCOUNTER — Ambulatory Visit
Admission: RE | Admit: 2022-12-09 | Discharge: 2022-12-09 | Disposition: A | Discharge status: Home / Self Care | Payer: Medicare PPO | Source: Ambulatory Visit | Attending: Nurse Practitioner | Admitting: Nurse Practitioner

## 2022-12-09 ENCOUNTER — Ambulatory Visit
Admission: RE | Admit: 2022-12-09 | Discharge: 2022-12-09 | Disposition: A | Discharge status: Home / Self Care | Payer: Medicare PPO | Attending: Nurse Practitioner | Admitting: Nurse Practitioner

## 2022-12-09 DIAGNOSIS — R052 Subacute cough: Secondary | ICD-10-CM | POA: Insufficient documentation

## 2022-12-09 DIAGNOSIS — R059 Cough, unspecified: Secondary | ICD-10-CM | POA: Diagnosis not present

## 2022-12-15 NOTE — Patient Instructions (Signed)
 Be Involved in Caring For Your Health:  Taking Medications When medications are taken as directed, they can greatly improve your health. But if they are not taken as prescribed, they may not work. In some cases, not taking them correctly can be harmful. To help ensure your treatment remains effective and safe, understand your medications and how to take them. Bring your medications to each visit for review by your provider.  Your lab results, notes, and after visit summary will be available on My Chart. We strongly encourage you to use this feature. If lab results are abnormal the clinic will contact you with the appropriate steps. If the clinic does not contact you assume the results are satisfactory. You can always view your results on My Chart. If you have questions regarding your health or results, please contact the clinic during office hours. You can also ask questions on My Chart.  We at [facility] are grateful that you chose [place] to provide your care. We strive to provide evidence-based and compassionate care and are always looking for feedback. If you get a survey from the clinic please complete this so we can hear your opinions.  Cough, Adult A cough helps to clear your throat and lungs. It may be a sign of an illness or another condition. A short-term (acute) cough may last 2-3 weeks. A long-term (chronic) cough may last 8 or more weeks. Many things can cause a cough. They include: Illnesses such as: An infection in your throat or lungs. Asthma or other heart or lung problems. Gastroesophageal reflux. This is when acid comes back up from your stomach. Breathing in things that bother (irritate) your lungs. Allergies. Postnasal drip. This is when mucus runs down the back of your throat. Smoking. Some medicines. Follow these instructions at home: Medicines Take over-the-counter and prescription medicines only as told by your doctor. Talk with your doctor before you take  cough medicine (cough suppressants). Eating and drinking Do not drink alcohol. Do not drink caffeine. Drink enough fluid to keep your pee (urine) pale yellow. Lifestyle Stay away from cigarette smoke. Do not smoke or use any products that contain nicotine or tobacco. If you need help quitting, ask your doctor. Stay away from things that make you cough. These may include perfume, candles, cleaning products, or campfire smoke. General instructions  Watch for any changes to your cough. Tell your doctor about them. Always cover your mouth when you cough. If the air is dry in your home, use a cool mist vaporizer or humidifier. If your cough is worse at night, try using extra pillows to raise your head up higher while you sleep. Rest as needed. Contact a doctor if: You have new symptoms. Your symptoms get worse. You cough up pus. You have a fever that does not go away. Your cough does not get better after 2-3 weeks. Cough medicine does not help, and you are not sleeping well. You have pain that gets worse or is not helped with medicine. You are losing weight and do not know why. You have night sweats. Get help right away if: You cough up blood. You have trouble breathing. Your heart is beating very fast. These symptoms may be an emergency. Get help right away. Call 911. Do not wait to see if the symptoms will go away. Do not drive yourself to the hospital. This information is not intended to replace advice given to you by your health care provider. Make sure you discuss any questions you  have with your health care provider. Document Revised: 12/07/2021 Document Reviewed: 12/07/2021 Elsevier Patient Education  2024 [employer].

## 2022-12-16 NOTE — Progress Notes (Signed)
Contacted via MyChart   Good afternoon Jillian Pierce, your imaging returned and is overall stable.  No acute changes.  Great news!!

## 2022-12-18 ENCOUNTER — Encounter: Payer: Self-pay | Admitting: Nurse Practitioner

## 2022-12-18 ENCOUNTER — Ambulatory Visit (INDEPENDENT_AMBULATORY_CARE_PROVIDER_SITE_OTHER): Payer: Medicare PPO | Admitting: Nurse Practitioner

## 2022-12-18 VITALS — BP 129/76 | HR 80 | Temp 98.0°F | Resp 18 | Ht 60.0 in | Wt 259.8 lb

## 2022-12-18 DIAGNOSIS — R052 Subacute cough: Secondary | ICD-10-CM

## 2022-12-18 NOTE — Progress Notes (Signed)
BP 129/76   Pulse 80   Temp 98 F (36.7 C) (Oral)   Resp 18   Ht 5' (1.524 m)   Wt 259 lb 12.8 oz (117.8 kg)   SpO2 97%   BMI 50.74 kg/m    Subjective:    Patient ID: Jillian Pierce, female    DOB: 1956-03-04, 67 y.o.   MRN: [id number]  HPI: Jillian Pierce is a 67 y.o. female  Chief Complaint  Patient presents with   Cough    Patient is here for a follow up on Cough. Patient says her cough is getting better, slowly but surely.    COUGH Follow-up today for ongoing cough since 11/01/22.  Was initially given Prednisone and Doxycycline at that, which improved for period and then it returned.  At visit on 12/03/22 we stopped her ACE and changed to an ARB + started Augmentin and Tussionex.  Chest imaging was stable.  Feeling a whole lot better at this time.  Only little bit coughing now. Fever: no Cough: a little bit -- improved Shortness of breath:  a little bit on occasion   Wheezing:  a little bit, will use inhaler for this Chest pain: no Chest tightness: no Chest congestion: no Nasal congestion: no Runny nose: yes Post nasal drip: yes Sneezing: no Sore throat: no Swollen glands: no Sinus pressure: no Headache: no Face pain: no Toothache: no Ear pain: none Ear pressure: none Eyes red/itching:no Eye drainage/crusting: no  Vomiting: no Rash: no Fatigue: a little bit Sick contacts: no Strep contacts: no  Context: better Recurrent sinusitis: no Relief with OTC cold/cough medications: yes  Treatments attempted: abx therapy, cough medicine, and medication changes    Relevant past medical, surgical, family and social history reviewed and updated as indicated. Interim medical history since our last visit reviewed. Allergies and medications reviewed and updated.  Review of Systems  Constitutional:  Positive for fatigue (on occasion). Negative for activity change, appetite change, diaphoresis and fever.  HENT:  Positive for postnasal drip and rhinorrhea. Negative for  congestion, ear discharge, ear pain, sinus pressure and sore throat.   Respiratory:  Positive for cough (improved) and shortness of breath (at baseline, no worsening). Negative for chest tightness and wheezing.   Cardiovascular:  Positive for leg swelling (at baseline). Negative for chest pain and palpitations.  Gastrointestinal: Negative.   Endocrine: Negative for cold intolerance, heat intolerance, polydipsia, polyphagia and polyuria.  Neurological: Negative.   Psychiatric/Behavioral: Negative.      Per HPI unless specifically indicated above     Objective:    BP 129/76   Pulse 80   Temp 98 F (36.7 C) (Oral)   Resp 18   Ht 5' (1.524 m)   Wt 259 lb 12.8 oz (117.8 kg)   SpO2 97%   BMI 50.74 kg/m   Wt Readings from Last 3 Encounters:  12/18/22 259 lb 12.8 oz (117.8 kg)  12/03/22 259 lb 12.8 oz (117.8 kg)  11/01/22 258 lb 9.6 oz (117.3 kg)    Physical Exam Vitals and nursing note reviewed.  Constitutional:      General: She is awake. She is not in acute distress.    Appearance: She is well-developed and well-groomed. She is morbidly obese. She is not ill-appearing.  HENT:     Head: Normocephalic.     Right Ear: Hearing normal.     Left Ear: Hearing normal.  Eyes:     General: Lids are normal.  Right eye: No discharge.        Left eye: No discharge.     Conjunctiva/sclera: Conjunctivae normal.     Pupils: Pupils are equal, round, and reactive to light.  Neck:     Thyroid: No thyromegaly.     Vascular: No carotid bruit.  Cardiovascular:     Rate and Rhythm: Normal rate and regular rhythm.     Heart sounds: Murmur heard.     Systolic murmur is present with a grade of 2/6.     No gallop.  Pulmonary:     Effort: Pulmonary effort is normal. No accessory muscle usage or respiratory distress.     Breath sounds: Normal breath sounds. No decreased breath sounds, wheezing or rhonchi.  Abdominal:     General: Bowel sounds are normal.     Palpations: Abdomen is soft.   Musculoskeletal:     Cervical back: Normal range of motion and neck supple.     Right lower leg: 1+ Pitting Edema present.     Left lower leg: 1+ Pitting Edema present.  Skin:    General: Skin is warm and dry.  Neurological:     Mental Status: She is alert and oriented to person, place, and time.  Psychiatric:        Attention and Perception: Attention normal.        Mood and Affect: Mood normal.        Speech: Speech normal.        Behavior: Behavior normal. Behavior is cooperative.        Thought Content: Thought content normal.    Results for orders placed or performed in visit on 12/03/22  Bayer DCA Hb A1c Waived  Result Value Ref Range   HB A1C (BAYER DCA - WAIVED) 6.9 (H) 4.8 - 5.6 %  Basic metabolic panel  Result Value Ref Range   Glucose 133 (H) 70 - 99 mg/dL   BUN 15 8 - 27 mg/dL   Creatinine, Ser 4.09 0.57 - 1.00 mg/dL   eGFR 69 >81 XB/JYN/8.29   BUN/Creatinine Ratio 16 12 - 28   Sodium 141 134 - 144 mmol/L   Potassium 4.1 3.5 - 5.2 mmol/L   Chloride 104 96 - 106 mmol/L   CO2 21 20 - 29 mmol/L   Calcium 9.4 8.7 - 10.3 mg/dL  Lipid Panel w/o Chol/HDL Ratio  Result Value Ref Range   Cholesterol, Total 124 100 - 199 mg/dL   Triglycerides 64 0 - 149 mg/dL   HDL 47 >56 mg/dL   VLDL Cholesterol Cal 14 5 - 40 mg/dL   LDL Chol Calc (NIH) 63 0 - 99 mg/dL      Assessment & Plan:   Problem List Items Addressed This Visit       Other   Subacute cough - Primary    Improved on exam today.  Will continue her on ARB for HTN at this time.  Discussed with patient that next visit we could consider stopping Metformin and changing to Ozempic or Trulicity weekly which may benefit weight loss and in turn help with SOB.  Educated her on these medications and how they work + side effects that can present.  We will discuss more in November.        Follow up plan: Return in about 3 months (around 03/20/2023) for T2DM, HTN/HLD.

## 2022-12-18 NOTE — Assessment & Plan Note (Signed)
Improved on exam today.  Will continue her on ARB for HTN at this time.  Discussed with patient that next visit we could consider stopping Metformin and changing to Ozempic or Trulicity weekly which may benefit weight loss and in turn help with SOB.  Educated her on these medications and how they work + side effects that can present.  We will discuss more in November.

## 2023-03-01 ENCOUNTER — Other Ambulatory Visit: Payer: Self-pay | Admitting: Nurse Practitioner

## 2023-03-03 NOTE — Telephone Encounter (Signed)
Requested medication (s) are due for refill today: expired medication date  Requested medication (s) are on the active medication list: yes   Last refill:  02/20/22 #100 12 refills  Future visit scheduled: yes in 3 weeks   Notes to clinic:  expired medication date. Do you want to renew Rx?     Requested Prescriptions  Pending Prescriptions Disp Refills   TRUE METRIX BLOOD GLUCOSE TEST test strip [Pharmacy Med Name: True Metrix Blood Glucose Test In Vitro Strip] 200 strip 3    Sig: CHECK BLOOD SUGAR 1 TO 2 TIMES DAILY     Endocrinology: Diabetes - Testing Supplies Passed - 03/01/2023  3:56 AM      Passed - Valid encounter within last 12 months    Recent Outpatient Visits           2 months ago Subacute cough   [place] [facility] [place], [place] T, NP   3 months ago Type 2 diabetes mellitus with diabetic neuropathy, without long-term current use of insulin (HCC)   [place] [facility] [place], [place] T, NP   4 months ago Post-viral cough syndrome   [place] [facility] M[place]um, Erin E, PA-C   9 months ago Diabetes mellitus with proteinuria (HCC)   [place] [facility] [place], Dorie Rank, NP   10 months ago Influenza A   [place] [facility] Mecum, Oswaldo Conroy, PA-C       Future Appointments             In 3 weeks [place], Dorie Rank, NP [facility] [employer] [facility], [facility]

## 2023-03-21 NOTE — Patient Instructions (Incomplete)
Be Involved in Caring For Your Health:  Taking Medications When medications are taken as directed, they can greatly improve your health. But if they are not taken as prescribed, they may not work. In some cases, not taking them correctly can be harmful. To help ensure your treatment remains effective and safe, understand your medications and how to take them. Bring your medications to each visit for review by your provider.  Your lab results, notes, and after visit summary will be available on My Chart. We strongly encourage you to use this feature. If lab results are abnormal the clinic will contact you with the appropriate steps. If the clinic does not contact you assume the results are satisfactory. You can always view your results on My Chart. If you have questions regarding your health or results, please contact the clinic during office hours. You can also ask questions on My Chart.  We at [facility] are grateful that you chose [place] to provide your care. We strive to provide evidence-based and compassionate care and are always looking for feedback. If you get a survey from the clinic please complete this so we can hear your opinions.  Diabetes Mellitus and Foot Care Diabetes, also called diabetes mellitus, may cause problems with your feet and legs because of poor blood flow (circulation). Poor circulation may make your skin: Become thinner and drier. Break more easily. Heal more slowly. Peel and crack. You may also have nerve damage (neuropathy). This can cause decreased feeling in your legs and feet. This means that you may not notice minor injuries to your feet that could lead to more serious problems. Finding and treating problems early is the best way to prevent future foot problems. How to care for your feet Foot hygiene  Wash your feet daily with warm water and mild soap. Do not use hot water. Then, pat your feet and the areas between your toes until they are fully dry. Do  not soak your feet. This can dry your skin. Trim your toenails straight across. Do not dig under them or around the cuticle. File the edges of your nails with an emery board or nail file. Apply a moisturizing lotion or petroleum jelly to the skin on your feet and to dry, brittle toenails. Use lotion that does not contain alcohol and is unscented. Do not apply lotion between your toes. Shoes and socks Wear clean socks or stockings every day. Make sure they are not too tight. Do not wear knee-high stockings. These may decrease blood flow to your legs. Wear shoes that fit well and have enough cushioning. Always look in your shoes before you put them on to be sure there are no objects inside. To break in new shoes, wear them for just a few hours a day. This prevents injuries on your feet. Wounds, scrapes, corns, and calluses  Check your feet daily for blisters, cuts, bruises, sores, and redness. If you cannot see the bottom of your feet, use a mirror or ask someone for help. Do not cut off corns or calluses or try to remove them with medicine. If you find a minor scrape, cut, or break in the skin on your feet, keep it and the skin around it clean and dry. You may clean these areas with mild soap and water. Do not clean the area with peroxide, alcohol, or iodine. If you have a wound, scrape, corn, or callus on your foot, look at it several times a day to make sure it  is healing and not infected. Check for: Redness, swelling, or pain. Fluid or blood. Warmth. Pus or a bad smell. General tips Do not cross your legs. This may decrease blood flow to your feet. Do not use heating pads or hot water bottles on your feet. They may burn your skin. If you have lost feeling in your feet or legs, you may not know this is happening until it is too late. Protect your feet from hot and cold by wearing shoes, such as at the beach or on hot pavement. Schedule a complete foot exam at least once a year or more often if  you have foot problems. Report any cuts, sores, or bruises to your health care provider right away. Where to find more information American Diabetes Association: diabetes.org Association of Diabetes Care & Education Specialists: diabeteseducator.org Contact a health care provider if: You have a condition that increases your risk of infection, and you have any cuts, sores, or bruises on your feet. You have an injury that is not healing. You have redness on your legs or feet. You feel burning or tingling in your legs or feet. You have pain or cramps in your legs and feet. Your legs or feet are numb. Your feet always feel cold. You have pain around any toenails. Get help right away if: You have a wound, scrape, corn, or callus on your foot and: You have signs of infection. You have a fever. You have a red line going up your leg. This information is not intended to replace advice given to you by your health care provider. Make sure you discuss any questions you have with your health care provider. Document Revised: 10/10/2021 Document Reviewed: 10/10/2021 Elsevier Patient Education  2024 [employer].

## 2023-03-24 ENCOUNTER — Encounter: Payer: Self-pay | Admitting: Nurse Practitioner

## 2023-03-24 ENCOUNTER — Ambulatory Visit (INDEPENDENT_AMBULATORY_CARE_PROVIDER_SITE_OTHER): Payer: Medicare PPO | Admitting: Nurse Practitioner

## 2023-03-24 ENCOUNTER — Ambulatory Visit: Payer: Self-pay | Admitting: *Deleted

## 2023-03-24 VITALS — BP 140/88 | HR 66 | Temp 98.1°F | Ht 60.0 in | Wt 259.0 lb

## 2023-03-24 DIAGNOSIS — R809 Proteinuria, unspecified: Secondary | ICD-10-CM | POA: Diagnosis not present

## 2023-03-24 DIAGNOSIS — E559 Vitamin D deficiency, unspecified: Secondary | ICD-10-CM

## 2023-03-24 DIAGNOSIS — M47816 Spondylosis without myelopathy or radiculopathy, lumbar region: Secondary | ICD-10-CM | POA: Insufficient documentation

## 2023-03-24 DIAGNOSIS — Z23 Encounter for immunization: Secondary | ICD-10-CM

## 2023-03-24 DIAGNOSIS — E1129 Type 2 diabetes mellitus with other diabetic kidney complication: Secondary | ICD-10-CM | POA: Diagnosis not present

## 2023-03-24 DIAGNOSIS — E538 Deficiency of other specified B group vitamins: Secondary | ICD-10-CM

## 2023-03-24 DIAGNOSIS — E1169 Type 2 diabetes mellitus with other specified complication: Secondary | ICD-10-CM | POA: Diagnosis not present

## 2023-03-24 DIAGNOSIS — E1159 Type 2 diabetes mellitus with other circulatory complications: Secondary | ICD-10-CM

## 2023-03-24 DIAGNOSIS — E1140 Type 2 diabetes mellitus with diabetic neuropathy, unspecified: Secondary | ICD-10-CM

## 2023-03-24 DIAGNOSIS — M85852 Other specified disorders of bone density and structure, left thigh: Secondary | ICD-10-CM | POA: Diagnosis not present

## 2023-03-24 DIAGNOSIS — R011 Cardiac murmur, unspecified: Secondary | ICD-10-CM | POA: Diagnosis not present

## 2023-03-24 DIAGNOSIS — E785 Hyperlipidemia, unspecified: Secondary | ICD-10-CM | POA: Diagnosis not present

## 2023-03-24 DIAGNOSIS — M545 Low back pain, unspecified: Secondary | ICD-10-CM

## 2023-03-24 DIAGNOSIS — I152 Hypertension secondary to endocrine disorders: Secondary | ICD-10-CM | POA: Diagnosis not present

## 2023-03-24 LAB — BAYER DCA HB A1C WAIVED: HB A1C (BAYER DCA - WAIVED): 6.7 % — ABNORMAL HIGH (ref 4.8–5.6)

## 2023-03-24 MED ORDER — OLMESARTAN MEDOXOMIL 40 MG PO TABS: 40.0000 mg | ORAL_TABLET | Freq: Every day | ORAL | 1 refills | Status: DC

## 2023-03-24 MED ORDER — TIZANIDINE HCL 4 MG PO TABS: 4.0000 mg | ORAL_TABLET | Freq: Four times a day (QID) | ORAL | 0 refills | Status: DC | PRN

## 2023-03-24 MED ORDER — VALSARTAN 80 MG PO TABS: 80.0000 mg | ORAL_TABLET | Freq: Every day | ORAL | 3 refills | Status: DC

## 2023-03-24 MED ORDER — GABAPENTIN 600 MG PO TABS: 300.0000 mg | ORAL_TABLET | Freq: Every day | ORAL | 10 refills | Status: DC

## 2023-03-24 NOTE — Assessment & Plan Note (Signed)
BMI 50.58, with T2DM and HTN/HLD.  Recommended eating smaller high protein, low fat meals more frequently and exercising 30 mins a day 5 times a week with a goal of 10-15lb weight loss in the next 3 months. Patient voiced their understanding and motivation to adhere to these recommendations.

## 2023-03-24 NOTE — Assessment & Plan Note (Signed)
Chronic, ongoing.  Continue current medication regimen and adjust as needed.  Lipid panel today.  Return in 3 months. 

## 2023-03-24 NOTE — Assessment & Plan Note (Signed)
Chronic, ongoing with recent A1c levels below goal, today 6.7%, trend down.  Urine ALB 68 February 2024, taking ARB.  Continue current medication regimen and adjust as needed, although discussed with her possibly stopping Metformin and changing to a GLP1 to assist with her weight loss goals, she is interested in this and will check coverage with insurance and alert provider. Continue daily B12 supplement for low levels in past.  Continue Gabapentin at night.  Return in 3 months. - Eye and foot exams up to date - Vaccinations up to date - ARB and statin on board (she reports taking Olmesartan and not Valsartan -- she will alert provider if different)

## 2023-03-24 NOTE — Progress Notes (Signed)
BP (!) 140/88 (BP Location: Left Arm, Patient Position: Sitting, Cuff Size: Large)   Pulse 66   Temp 98.1 F (36.7 C) (Oral)   Ht 5' (1.524 m)   Wt 259 lb (117.5 kg)   SpO2 97%   BMI 50.58 kg/m    Subjective:    Patient ID: Jillian Pierce, female    DOB: 06-13-1955, 67 y.o.   MRN: [id number]  HPI: Jillian Pierce is a 67 y.o. female  Chief Complaint  Patient presents with   Diabetes   Hyperlipidemia   Hypertension   DIABETES A1c 6.9% in August.  Takes Metformin 250 MG BID + Gabapentin 300 MG at bedtime for ongoing back & hip pain.  Continues to take Vitamin D and B12 at home for history of low levels.  Endorses occasional burning to lower legs.  She is interested in Ozempic or Trulicity -- will call insurance to see about coverage. Hypoglycemic episodes:no Polydipsia/polyuria: no Visual disturbance: no Chest pain: no Paresthesias: no Glucose Monitoring: no             Accucheck frequency: every morning             Fasting glucose: 131 this morning -- she reports average around 130 range             Post prandial:             Evening:             Before meals: Taking Insulin?: no             Long acting insulin:             Short acting insulin: Blood Pressure Monitoring: not checking Retinal Examination: Up to Date -- [facility] Foot Exam: Up to Date Pneumovax: Up to Date Influenza: Provided today Aspirin: yes    HYPERTENSION / HYPERLIPIDEMIA Taking Lipitor, Olmesartan (she reports not taking Valsartan), Clonidine, and Lasix PRN (not needed recently). Self stopped taking Amlodipine and noticed improvement in leg swelling after this.  Was previously on Lisinopril, but started having a nagging cough and this was stopped, cough has improved.   Echo on 09/26/21 noted EF 60-65% and moderate LVH with mild aortic valve regurgitation.  Baseline she sleeps in recliner at times, lying down, but denies orthopnea. Satisfied with current treatment? yes Duration of  hypertension: chronic BP monitoring frequency: not checking BP range:  BP medication side effects: no Duration of hyperlipidemia: chronic Cholesterol medication side effects: no Cholesterol supplements: none Medication compliance: good compliance Aspirin: yes Recent stressors: no Recurrent headaches: no Visual changes: no Palpitations: no Dyspnea: at present with walking sometime Chest pain: no Lower extremity edema: has improved since stopping Amlodipine Dizzy/lightheaded: no  OSTEOPENIA Satisfied with current treatment?: yes Adequate calcium & vitamin D: yes Weight bearing exercises: no   BACK PAIN Started recently as her refrigerator was leaking, but after her back started hurting.  Started last week.  Continues to have difficulty with this. Taking her Meloxicam. Duration: days Mechanism of injury: unknown Location: midline and low back Onset: gradual Severity: 8/10 Quality: sharp, dull, aching, and throbbing Frequency: intermittent Radiation: none Aggravating factors: lifting, movement, walking, and bending Alleviating factors: nothing -- occasionally Tylenol works Status: fluctuating Treatments attempted: Meloxicam, Tylenol, Ibuprofen,  heating pad Relief with NSAIDs?: no Nighttime pain:  no Paresthesias / decreased sensation:  no Bowel / bladder incontinence:  no Fevers:  no Dysuria / urinary frequency:  no  03/24/2023    9:41 AM 12/18/2022    9:50 AM 12/03/2022    9:08 AM 11/01/2022    9:27 AM 07/02/2022   11:04 AM  Depression screen PHQ 2/9  Decreased Interest 0 0 0 0 0  Down, Depressed, Hopeless 0 0 0 0 0  PHQ - 2 Score 0 0 0 0 0  Altered sleeping 1 0 1 2 0  Tired, decreased energy 2 1 1 2  0  Change in appetite 0 0 0 0 0  Feeling bad or failure about yourself  0 0 0 0 0  Trouble concentrating 0 0 0 0 0  Moving slowly or fidgety/restless 0 0 0 0 0  Suicidal thoughts 0 0 0 0 0  PHQ-9 Score 3 1 2 4  0  Difficult doing work/chores Not difficult at all   Not difficult at all Not difficult at all Not difficult at all       03/24/2023    9:41 AM 12/18/2022    9:50 AM 12/03/2022    9:09 AM 11/01/2022    9:27 AM  GAD 7 : Generalized Anxiety Score  Nervous, Anxious, on Edge 0 0 0 0  Control/stop worrying 0 0 0 0  Worry too much - different things 0 0 0 0  Trouble relaxing 0 1 0 1  Restless 0 0 0 0  Easily annoyed or irritable 0 0 0 0  Afraid - awful might happen 0 0 0 0  Total GAD 7 Score 0 1 0 1  Anxiety Difficulty Not difficult at all  Not difficult at all    Relevant past medical, surgical, family and social history reviewed and updated as indicated. Interim medical history since our last visit reviewed. Allergies and medications reviewed and updated.  Review of Systems  Constitutional:  Negative for activity change, appetite change, diaphoresis, fatigue and fever.  Respiratory:  Positive for shortness of breath (at baseline, no worsening). Negative for cough, chest tightness and wheezing.   Cardiovascular:  Positive for leg swelling (improved). Negative for chest pain and palpitations.  Gastrointestinal: Negative.   Endocrine: Negative for cold intolerance, heat intolerance, polydipsia, polyphagia and polyuria.  Neurological: Negative.   Psychiatric/Behavioral: Negative.     Per HPI unless specifically indicated above     Objective:    BP (!) 140/88 (BP Location: Left Arm, Patient Position: Sitting, Cuff Size: Large)   Pulse 66   Temp 98.1 F (36.7 C) (Oral)   Ht 5' (1.524 m)   Wt 259 lb (117.5 kg)   SpO2 97%   BMI 50.58 kg/m   Wt Readings from Last 3 Encounters:  03/24/23 259 lb (117.5 kg)  12/18/22 259 lb 12.8 oz (117.8 kg)  12/03/22 259 lb 12.8 oz (117.8 kg)    Physical Exam Vitals and nursing note reviewed.  Constitutional:      General: She is awake. She is not in acute distress.    Appearance: She is well-developed and well-groomed. She is morbidly obese. She is not ill-appearing.  HENT:     Head:  Normocephalic.     Right Ear: Hearing normal.     Left Ear: Hearing normal.  Eyes:     General: Lids are normal.        Right eye: No discharge.        Left eye: No discharge.     Conjunctiva/sclera: Conjunctivae normal.     Pupils: Pupils are equal, round, and reactive to light.  Neck:     Thyroid:  No thyromegaly.     Vascular: No carotid bruit.  Cardiovascular:     Rate and Rhythm: Normal rate and regular rhythm.     Heart sounds: Murmur heard.     Systolic murmur is present with a grade of 2/6.     No gallop.  Pulmonary:     Effort: Pulmonary effort is normal. No accessory muscle usage or respiratory distress.     Breath sounds: Normal breath sounds.  Abdominal:     General: Bowel sounds are normal.     Palpations: Abdomen is soft.  Musculoskeletal:     Cervical back: Normal range of motion and neck supple.     Lumbar back: Tenderness (mid lower back) present. No swelling, spasms or bony tenderness. Decreased range of motion (with flexion and extension). Negative right straight leg raise test and negative left straight leg raise test.     Right lower leg: 1+ Pitting Edema present.     Left lower leg: 1+ Pitting Edema present.  Skin:    General: Skin is warm and dry.  Neurological:     Mental Status: She is alert and oriented to person, place, and time.  Psychiatric:        Attention and Perception: Attention normal.        Mood and Affect: Mood normal.        Speech: Speech normal.        Behavior: Behavior normal. Behavior is cooperative.        Thought Content: Thought content normal.    Diabetic Foot Exam - Simple   Simple Foot Form Visual Inspection No deformities, no ulcerations, no other skin breakdown bilaterally: Yes Sensation Testing Intact to touch and monofilament testing bilaterally: Yes Pulse Check Posterior Tibialis and Dorsalis pulse intact bilaterally: Yes Comments     Results for orders placed or performed in visit on 12/03/22  Bayer DCA Hb A1c  Waived  Result Value Ref Range   HB A1C (BAYER DCA - WAIVED) 6.9 (H) 4.8 - 5.6 %  Basic metabolic panel  Result Value Ref Range   Glucose 133 (H) 70 - 99 mg/dL   BUN 15 8 - 27 mg/dL   Creatinine, Ser 0.86 0.57 - 1.00 mg/dL   eGFR 69 >57 QI/ONG/2.95   BUN/Creatinine Ratio 16 12 - 28   Sodium 141 134 - 144 mmol/L   Potassium 4.1 3.5 - 5.2 mmol/L   Chloride 104 96 - 106 mmol/L   CO2 21 20 - 29 mmol/L   Calcium 9.4 8.7 - 10.3 mg/dL  Lipid Panel w/o Chol/HDL Ratio  Result Value Ref Range   Cholesterol, Total 124 100 - 199 mg/dL   Triglycerides 64 0 - 149 mg/dL   HDL 47 >28 mg/dL   VLDL Cholesterol Cal 14 5 - 40 mg/dL   LDL Chol Calc (NIH) 63 0 - 99 mg/dL      Assessment & Plan:   Problem List Items Addressed This Visit       Cardiovascular and Mediastinum   Hypertension associated with diabetes (HCC)    Chronic, stable.  BP at goal in office today, however has an ongoing hacking cough.  Will continue ARB (Olmesartan 20 MG daily), was on ACE but having improved cough off of this.  Recommend checking BP at home three mornings a week and documenting for provider + focus on DASH diet.  LABS: CBC, CMP, TSH.  Refills up to date.        Relevant Medications  amLODipine (NORVASC) 10 MG tablet   olmesartan (BENICAR) 40 MG tablet   Other Relevant Orders   Bayer DCA Hb A1c Waived   Comprehensive metabolic panel   CBC with Differential/Platelet   TSH     Endocrine   Diabetes mellitus with proteinuria (HCC)    Chronic, ongoing with recent A1c levels below goal, today 6.7%, trend down.  Urine ALB 68 February 2024, taking ARB.  Continue current medication regimen and adjust as needed, although discussed with her possibly stopping Metformin and changing to a GLP1 to assist with her weight loss goals, she is interested in this and will check coverage with insurance and alert provider. Continue daily B12 supplement for low levels in past.  Continue Gabapentin at night.  Return in 3 months. -  Eye and foot exams up to date - Vaccinations up to date - ARB and statin on board (she reports taking Olmesartan and not Valsartan -- she will alert provider if different)      Relevant Medications   olmesartan (BENICAR) 40 MG tablet   Other Relevant Orders   Bayer DCA Hb A1c Waived   Hyperlipidemia associated with type 2 diabetes mellitus (HCC)    Chronic, ongoing.  Continue current medication regimen and adjust as needed.  Lipid panel today. Return in 3 months.        Relevant Medications   amLODipine (NORVASC) 10 MG tablet   olmesartan (BENICAR) 40 MG tablet   Other Relevant Orders   Bayer DCA Hb A1c Waived   Comprehensive metabolic panel   Lipid Panel w/o Chol/HDL Ratio   Type 2 diabetes mellitus with diabetic neuropathy, unspecified (HCC) - Primary    Chronic, ongoing with recent A1c levels below goal, today 6.7%, trend down.  Urine ALB 68 February 2024, taking ARB.  Continue current medication regimen and adjust as needed, although discussed with her possibly stopping Metformin and changing to a GLP1 to assist with her weight loss goals, she is interested in this and will check coverage with insurance and alert provider. Continue daily B12 supplement for low levels in past.  Continue Gabapentin at night.  Return in 3 months. - Eye and foot exams up to date - Vaccinations up to date - ARB and statin on board (she reports taking Olmesartan and not Valsartan -- she will alert provider if different)      Relevant Medications   olmesartan (BENICAR) 40 MG tablet   Other Relevant Orders   Bayer DCA Hb A1c Waived     Musculoskeletal and Integument   Osteopenia of neck of left femur    Noted on DEXA 01/03/21 == continue Vitamin D supplement daily.  Recheck DEXA in 2027.      Relevant Orders   VITAMIN D 25 Hydroxy (Vit-D Deficiency, Fractures)     Other   Vitamin B12 deficiency    Ongoing and stable.  Vitamin B12 checked today and continue supplement.      Relevant Orders    CBC with Differential/Platelet   Vitamin B12   Acute lumbar back pain    Acute for one week.  Does not wish to take Prednisone due to risk for elevation in sugar levels.  Will start Tizanidine, which she has taken before and tolerated, recommend she take only as needed and not if driving or working.  Continue at home regimen and cut back on Ibuprofen and Meloxicam due to BP.      Relevant Medications   tiZANidine (ZANAFLEX) 4 MG tablet  Heart murmur    With aortic sclerosis and moderate LVH past echo.  Asymptomatic at this time.  Consider cardiology referral if any worsening heart function.      Morbid obesity (HCC)    BMI 50.58, with T2DM and HTN/HLD.  Recommended eating smaller high protein, low fat meals more frequently and exercising 30 mins a day 5 times a week with a goal of 10-15lb weight loss in the next 3 months. Patient voiced their understanding and motivation to adhere to these recommendations.       Vitamin D deficiency    Ongoing.  Will check vitamin D today and continue supplement.      Relevant Orders   VITAMIN D 25 Hydroxy (Vit-D Deficiency, Fractures)   Other Visit Diagnoses     Flu vaccine need       Flu vaccine today, educated on this.   Relevant Orders   Flu Vaccine Trivalent High Dose (Fluad) (Completed)        Follow up plan: Return in about 4 weeks (around 04/21/2023) for Back Pain and HTN -- increase Olmesartan.

## 2023-03-24 NOTE — Telephone Encounter (Signed)
Patient was notified and verbalized understanding. Patient says she was going to reach out to her insurance and let our office know if she needs anything.

## 2023-03-24 NOTE — Assessment & Plan Note (Signed)
Chronic, stable.  BP at goal in office today, however has an ongoing hacking cough.  Will continue ARB (Olmesartan 20 MG daily), was on ACE but having improved cough off of this.  Recommend checking BP at home three mornings a week and documenting for provider + focus on DASH diet.  LABS: CBC, CMP, TSH.  Refills up to date.

## 2023-03-24 NOTE — Assessment & Plan Note (Signed)
With aortic sclerosis and moderate LVH past echo.  Asymptomatic at this time.  Consider cardiology referral if any worsening heart function.

## 2023-03-24 NOTE — Telephone Encounter (Signed)
Message from Melville T sent at 03/24/2023  1:08 PM EST  Summary: medication question   Patient called stated she was prescribed Ozempic today but she needs to know the dosage so she can let the insurance know. She also wants to know if provider wants her to continue to take the Lisinopril 10mg  which she did not see on her med list. Please f/u with patient          Call History  Contact Date/Time Type Contact Phone/Fax User  03/24/2023 01:05 PM EST Phone (Incoming) Jillian Pierce, Jillian Pierce (Self) [phone] Angelina Sheriff   Reason for Disposition  [1] Caller has URGENT medicine question about med that PCP or specialist prescribed AND [2] triager unable to answer question  Answer Assessment - Initial Assessment Questions 1. NAME of MEDICINE: "What medicine(s) are you calling about?"     Ozempic 2. QUESTION: "What is your question?" (e.g., double dose of medicine, side effect)     Prescribed today.  03/24/2023  She needs to know the dosage so she can tell her insurance co.  Is she supposed to continue to take Lisinopril 10 mg?   It's not on her medication list. 3. PRESCRIBER: "Who prescribed the medicine?" Reason: if prescribed by specialist, call should be referred to that group.     Aura Dials, NP 4. SYMPTOMS: "Do you have any symptoms?" If Yes, ask: "What symptoms are you having?"  "How bad are the symptoms (e.g., mild, moderate, severe)     N/A 5. PREGNANCY:  "Is there any chance that you are pregnant?" "When was your last menstrual period?"     N/A  Protocols used: Medication Question Call-A-AH

## 2023-03-24 NOTE — Assessment & Plan Note (Signed)
Ongoing and stable.  Vitamin B12 checked today and continue supplement.

## 2023-03-24 NOTE — Telephone Encounter (Signed)
  Chief Complaint: Pt needs to know the dosage of the Ozempic that was prescribed today by Aura Dials, NP so she can let her insurance know.  (See pt message). She also wants to know if she should continue taking the Lisinopril 10 mg because it's no longer on her medication list.    Symptoms: N/A Frequency: N/A Pertinent Negatives: Patient denies N/A Disposition: [] ED /[] Urgent Care (no appt availability in office) / [] Appointment(In office/virtual)/ []  [address] [facility]/ [] Home Care/ [] Refused Recommended Disposition /[] [place] [facility]/ [x]  Follow-up with PCP Additional Notes: Pt's message forwarded to Aura Dials, NP.

## 2023-03-24 NOTE — Assessment & Plan Note (Signed)
Ongoing.  Will check vitamin D today and continue supplement. 

## 2023-03-24 NOTE — Assessment & Plan Note (Signed)
Acute for one week.  Does not wish to take Prednisone due to risk for elevation in sugar levels.  Will start Tizanidine, which she has taken before and tolerated, recommend she take only as needed and not if driving or working.  Continue at home regimen and cut back on Ibuprofen and Meloxicam due to BP.

## 2023-03-24 NOTE — Assessment & Plan Note (Signed)
Noted on DEXA 01/03/21 == continue Vitamin D supplement daily.  Recheck DEXA in 2027. 

## 2023-03-25 LAB — CBC WITH DIFFERENTIAL/PLATELET
Basophils Absolute: 0 10*3/uL (ref 0.0–0.2)
Basos: 1 %
EOS (ABSOLUTE): 0.2 10*3/uL (ref 0.0–0.4)
Eos: 4 %
Hematocrit: 35.6 % (ref 34.0–46.6)
Hemoglobin: 11.3 g/dL (ref 11.1–15.9)
Immature Grans (Abs): 0 10*3/uL (ref 0.0–0.1)
Immature Granulocytes: 0 %
Lymphocytes Absolute: 1.7 10*3/uL (ref 0.7–3.1)
Lymphs: 29 %
MCH: 26.3 pg — ABNORMAL LOW (ref 26.6–33.0)
MCHC: 31.7 g/dL (ref 31.5–35.7)
MCV: 83 fL (ref 79–97)
Monocytes Absolute: 0.5 10*3/uL (ref 0.1–0.9)
Monocytes: 8 %
Neutrophils Absolute: 3.4 10*3/uL (ref 1.4–7.0)
Neutrophils: 58 %
Platelets: 434 10*3/uL (ref 150–450)
RBC: 4.29 x10E6/uL (ref 3.77–5.28)
RDW: 13.8 % (ref 11.7–15.4)
WBC: 5.8 10*3/uL (ref 3.4–10.8)

## 2023-03-25 LAB — COMPREHENSIVE METABOLIC PANEL
ALT: 10 [IU]/L (ref 0–32)
AST: 15 [IU]/L (ref 0–40)
Albumin: 4.3 g/dL (ref 3.9–4.9)
Alkaline Phosphatase: 92 [IU]/L (ref 44–121)
BUN/Creatinine Ratio: 17 (ref 12–28)
BUN: 16 mg/dL (ref 8–27)
Bilirubin Total: 0.5 mg/dL (ref 0.0–1.2)
CO2: 22 mmol/L (ref 20–29)
Calcium: 9.6 mg/dL (ref 8.7–10.3)
Chloride: 104 mmol/L (ref 96–106)
Creatinine, Ser: 0.95 mg/dL (ref 0.57–1.00)
Globulin, Total: 2.8 g/dL (ref 1.5–4.5)
Glucose: 128 mg/dL — ABNORMAL HIGH (ref 70–99)
Potassium: 4.3 mmol/L (ref 3.5–5.2)
Sodium: 142 mmol/L (ref 134–144)
Total Protein: 7.1 g/dL (ref 6.0–8.5)
eGFR: 65 mL/min/{1.73_m2} (ref >59)

## 2023-03-25 LAB — VITAMIN D 25 HYDROXY (VIT D DEFICIENCY, FRACTURES): Vit D, 25-Hydroxy: 34.4 ng/mL (ref 30.0–100.0)

## 2023-03-25 LAB — LIPID PANEL W/O CHOL/HDL RATIO
Cholesterol, Total: 121 mg/dL (ref 100–199)
HDL: 46 mg/dL (ref >39)
LDL Chol Calc (NIH): 58 mg/dL (ref 0–99)
Triglycerides: 86 mg/dL (ref 0–149)
VLDL Cholesterol Cal: 17 mg/dL (ref 5–40)

## 2023-03-25 LAB — TSH: TSH: 1.12 u[IU]/mL (ref 0.450–4.500)

## 2023-03-25 LAB — VITAMIN B12: Vitamin B-12: 1021 pg/mL (ref 232–1245)

## 2023-03-25 NOTE — Progress Notes (Signed)
Contacted via MyChart   Good afternoon Lovely, your labs have returned and overall are fantastic.  No changes needed.  Great job!!  Any questions? Keep being amazing!!  Thank you for allowing me to participate in your care.  I appreciate you. Kindest regards, Evren Shankland

## 2023-04-05 ENCOUNTER — Other Ambulatory Visit: Payer: Self-pay | Admitting: Nurse Practitioner

## 2023-04-05 DIAGNOSIS — E1140 Type 2 diabetes mellitus with diabetic neuropathy, unspecified: Secondary | ICD-10-CM

## 2023-04-05 DIAGNOSIS — E1169 Type 2 diabetes mellitus with other specified complication: Secondary | ICD-10-CM

## 2023-04-05 DIAGNOSIS — E1159 Type 2 diabetes mellitus with other circulatory complications: Secondary | ICD-10-CM

## 2023-04-05 DIAGNOSIS — E1129 Type 2 diabetes mellitus with other diabetic kidney complication: Secondary | ICD-10-CM

## 2023-04-07 NOTE — Telephone Encounter (Signed)
Requested medication (s) are due for refill today: Amount not specified  Requested medication (s) are on the active medication list: yes    Last refill: 03/21/23  Future visit scheduled yes 04/21/23  Notes to clinic:Historical provider, please review. Thank you.  Requested Prescriptions  Pending Prescriptions Disp Refills   amLODipine (NORVASC) 10 MG tablet [Pharmacy Med Name: amLODIPine Besylate Oral Tablet 10 MG] 90 tablet 3    Sig: TAKE 1 TABLET EVERY DAY     Cardiovascular: Calcium Channel Blockers 2 Failed - 04/07/2023  9:51 AM      Failed - Last BP in normal range    BP Readings from Last 1 Encounters:  03/24/23 (!) 140/88         Passed - Last Heart Rate in normal range    Pulse Readings from Last 1 Encounters:  03/24/23 66         Passed - Valid encounter within last 6 months    Recent Outpatient Visits           2 weeks ago Type 2 diabetes mellitus with diabetic neuropathy, without long-term current use of insulin (HCC)   [place] [facility] [place], Holt T, NP   3 months ago Subacute cough   [place] [facility] [place], [place] T, NP   4 months ago Type 2 diabetes mellitus with diabetic neuropathy, without long-term current use of insulin (HCC)   [place] [facility] [place], [place] T, NP   5 months ago Post-viral cough syndrome   [place] [facility] Mecum, Erin E, PA-C   10 months ago Diabetes mellitus with proteinuria (HCC)   [place] [facility] [place], Dorie Rank, NP       Future Appointments             In 2 weeks Cannady, Dorie Rank, NP [place] [facility]            Refused Prescriptions Disp Refills   metFORMIN (GLUCOPHAGE) 500 MG tablet [Pharmacy Med Name: metFORMIN HCl Oral Tablet 500 MG] 90 tablet 3    Sig: TAKE 1/2 TABLET TWICE DAILY     Endocrinology:  Diabetes - Biguanides Failed - 04/07/2023  9:51 AM      Failed - CBC within normal  limits and completed in the last 12 months    WBC  Date Value Ref Range Status  03/24/2023 5.8 3.4 - 10.8 x10E3/uL Final  05/05/2022 5.1 4.0 - 10.5 K/uL Final   RBC  Date Value Ref Range Status  03/24/2023 4.29 3.77 - 5.28 x10E6/uL Final  05/05/2022 4.23 3.87 - 5.11 MIL/uL Final   Hemoglobin  Date Value Ref Range Status  03/24/2023 11.3 11.1 - 15.9 g/dL Final   Hematocrit  Date Value Ref Range Status  03/24/2023 35.6 34.0 - 46.6 % Final   MCHC  Date Value Ref Range Status  03/24/2023 31.7 31.5 - 35.7 g/dL Final  16/01/9603 54.0 30.0 - 36.0 g/dL Final   [facility]  Date Value Ref Range Status  03/24/2023 26.3 (L) 26.6 - 33.0 pg Final  05/05/2022 26.5 26.0 - 34.0 pg Final   MCV  Date Value Ref Range Status  03/24/2023 83 79 - 97 fL Final  06/03/2013 81 80 - 100 fL Final   No results found for: "PLTCOUNTKUC", "LABPLAT", "POCPLA" RDW  Date Value Ref Range Status  03/24/2023 13.8 11.7 - 15.4 % Final  06/03/2013 13.9 11.5 - 14.5 % Final  Passed - Cr in normal range and within 360 days    Creatinine  Date Value Ref Range Status  06/03/2013 0.77 0.60 - 1.30 mg/dL Final   Creatinine, Ser  Date Value Ref Range Status  03/24/2023 0.95 0.57 - 1.00 mg/dL Final         Passed - HBA1C is between 0 and 7.9 and within 180 days    Hemoglobin A1C  Date Value Ref Range Status  03/09/2015 6.3  Final   HB A1C (BAYER DCA - WAIVED)  Date Value Ref Range Status  03/24/2023 6.7 (H) 4.8 - 5.6 % Final    Comment:             Prediabetes: 5.7 - 6.4          Diabetes: >6.4          Glycemic control for adults with diabetes: <7.0          Passed - eGFR in normal range and within 360 days    EGFR (African American)  Date Value Ref Range Status  06/03/2013 >60  Final   GFR calc Af Amer  Date Value Ref Range Status  04/10/2020 72 >59 mL/min/1.73 Final    Comment:    **In accordance with recommendations from the NKF-ASN Task force,**   [facility] is in the process of updating  its eGFR calculation to the   2021 CKD-EPI creatinine equation that estimates kidney function   without a race variable.    EGFR (Non-African Amer.)  Date Value Ref Range Status  06/03/2013 >60  Final    Comment:    eGFR values <25mL/min/1.73 m2 may be an indication of chronic kidney disease (CKD). Calculated eGFR is useful in patients with stable renal function. The eGFR calculation will not be reliable in acutely ill patients when serum creatinine is changing rapidly. It is not useful in  patients on dialysis. The eGFR calculation may not be applicable to patients at the low and high extremes of body sizes, pregnant women, and vegetarians.    GFR, Estimated  Date Value Ref Range Status  05/05/2022 >60 >60 mL/min Final    Comment:    (NOTE) Calculated using the CKD-EPI Creatinine Equation (2021)    eGFR  Date Value Ref Range Status  03/24/2023 65 >59 mL/min/1.73 Final         Passed - B12 Level in normal range and within 720 days    Vitamin B-12  Date Value Ref Range Status  03/24/2023 1,021 232 - 1,245 pg/mL Final         Passed - Valid encounter within last 6 months    Recent Outpatient Visits           2 weeks ago Type 2 diabetes mellitus with diabetic neuropathy, without long-term current use of insulin (HCC)   [place] [facility] [place], [place] T, NP   3 months ago Subacute cough   [place] [facility] [place], [place] T, NP   4 months ago Type 2 diabetes mellitus with diabetic neuropathy, without long-term current use of insulin (HCC)   [place] [facility] [place], [place] T, NP   5 months ago Post-viral cough syndrome   [place] [facility] Mecum, Erin E, PA-C   10 months ago Diabetes mellitus with proteinuria (HCC)    [facility] Marjie Skiff, NP       Future Appointments  In 2 weeks Cannady, Dorie Rank, NP [place] [facility]              cloNIDine (CATAPRES) 0.3 MG tablet [Pharmacy Med Name: cloNIDine HCl Oral Tablet 0.3 MG] 180 tablet 3    Sig: TAKE 1 TABLET TWICE DAILY     Cardiovascular:  Alpha-2 Agonists Failed - 04/07/2023  9:51 AM      Failed - Last BP in normal range    BP Readings from Last 1 Encounters:  03/24/23 (!) 140/88         Passed - Last Heart Rate in normal range    Pulse Readings from Last 1 Encounters:  03/24/23 66         Passed - Valid encounter within last 6 months    Recent Outpatient Visits           2 weeks ago Type 2 diabetes mellitus with diabetic neuropathy, without long-term current use of insulin (HCC)   [place] [facility] [place], [place] T, NP   3 months ago Subacute cough   [place] [facility] [place], [place] T, NP   4 months ago Type 2 diabetes mellitus with diabetic neuropathy, without long-term current use of insulin (HCC)   [place] [facility] [place], [place] T, NP   5 months ago Post-viral cough syndrome   [place] [facility] Mecum, Erin E, PA-C   10 months ago Diabetes mellitus with proteinuria (HCC)   [place] [facility] [place], [place] T, NP       Future Appointments             In 2 weeks Cannady, Dorie Rank, NP  [facility]             furosemide (LASIX) 20 MG tablet [Pharmacy Med Name: Furosemide Oral Tablet 20 MG] 90 tablet 3    Sig: TAKE 1 TABLET EVERY DAY     Cardiovascular:  Diuretics - Loop Failed - 04/07/2023  9:51 AM      Failed - Mg Level in normal range and within 180 days    No results found for: "MG"       Failed - Last BP in normal range    BP Readings from Last 1 Encounters:  03/24/23 (!) 140/88         Passed - K in normal range and within 180 days    Potassium  Date Value Ref Range Status  03/24/2023 4.3 3.5 - 5.2 mmol/L Final  06/03/2013 3.1 (L) 3.5 - 5.1 mmol/L Final         Passed - Ca in normal  range and within 180 days    Calcium  Date Value Ref Range Status  03/24/2023 9.6 8.7 - 10.3 mg/dL Final   Calcium, Total  Date Value Ref Range Status  06/03/2013 9.4 8.5 - 10.1 mg/dL Final         Passed - Na in normal range and within 180 days    Sodium  Date Value Ref Range Status  03/24/2023 142 134 - 144 mmol/L Final  06/03/2013 137 136 - 145 mmol/L Final         Passed - Cr in normal range and within 180 days    Creatinine  Date Value Ref Range Status  06/03/2013 0.77 0.60 - 1.30 mg/dL Final   Creatinine, Ser  Date Value Ref Range Status  03/24/2023 0.95 0.57 - 1.00 mg/dL Final  Passed - Cl in normal range and within 180 days    Chloride  Date Value Ref Range Status  03/24/2023 104 96 - 106 mmol/L Final  06/03/2013 101 98 - 107 mmol/L Final         Passed - Valid encounter within last 6 months    Recent Outpatient Visits           2 weeks ago Type 2 diabetes mellitus with diabetic neuropathy, without long-term current use of insulin (HCC)   [place] [facility] [place], [place] T, NP   3 months ago Subacute cough   [place] [facility] [place], Sheld[place] T, NP   4 months ago Type 2 diabetes mellitus with diabetic neuropathy, without long-term current use of insulin (HCC)   [place] [facility] [place], [place] T, NP   5 months ago Post-viral cough syndrome   [place] [facility] Mecum, Erin E, PA-C   10 months ago Diabetes mellitus with proteinuria (HCC)   [place] [facility] [place], Corrie Dandy T, NP       Future Appointments             In 2 weeks Cannady, Dorie Rank, NP [place] [facility]             atorvastatin (LIPITOR) 10 MG tablet [Pharmacy Med Name: Atorvastatin Calcium Oral Tablet 10 MG] 90 tablet 3    Sig: TAKE 1 TABLET EVERY DAY     Cardiovascular:  Antilipid - Statins Failed - 04/07/2023  9:51 AM      Failed - Lipid Panel in normal  range within the last 12 months    Cholesterol, Total  Date Value Ref Range Status  03/24/2023 121 100 - 199 mg/dL Final   Cholesterol Piccolo, Waived  Date Value Ref Range Status  08/20/2019 121 <200 mg/dL Final    Comment:                            Desirable                <200                         Borderline High      200- 239                         High                     >239    LDL Chol Calc (NIH)  Date Value Ref Range Status  03/24/2023 58 0 - 99 mg/dL Final   HDL  Date Value Ref Range Status  03/24/2023 46 >39 mg/dL Final   Triglycerides  Date Value Ref Range Status  03/24/2023 86 0 - 149 mg/dL Final   Triglycerides Piccolo,Waived  Date Value Ref Range Status  08/20/2019 105 <150 mg/dL Final    Comment:                            Normal                   <150                         Borderline High  150 - 199                         High                200 - 499                         Very High                >499          Passed - Patient is not pregnant      Passed - Valid encounter within last 12 months    Recent Outpatient Visits           2 weeks ago Type 2 diabetes mellitus with diabetic neuropathy, without long-term current use of insulin (HCC)   [place] [facility] [place], [place] T, NP   3 months ago Subacute cough   [place] [facility] [place], [place] T, NP   4 months ago Type 2 diabetes mellitus with diabetic neuropathy, without long-term current use of insulin (HCC)   [place] [facility] [place], [place] T, NP   5 months ago Post-viral cough syndrome   [place] [facility] Mecum, Erin E, PA-C   10 months ago Diabetes mellitus with proteinuria (HCC)   [place] [facility] [place], Dorie Rank, NP       Future Appointments             In 2 weeks Cannady, Dorie Rank, NP [place] [facility]

## 2023-04-07 NOTE — Telephone Encounter (Signed)
Requested Prescriptions  Pending Prescriptions Disp Refills   amLODipine (NORVASC) 10 MG tablet [Pharmacy Med Name: amLODIPine Besylate Oral Tablet 10 MG] 90 tablet 3    Sig: TAKE 1 TABLET EVERY DAY     Cardiovascular: Calcium Channel Blockers 2 Failed - 04/07/2023  9:48 AM      Failed - Last BP in normal range    BP Readings from Last 1 Encounters:  03/24/23 (!) 140/88         Passed - Last Heart Rate in normal range    Pulse Readings from Last 1 Encounters:  03/24/23 66         Passed - Valid encounter within last 6 months    Recent Outpatient Visits           2 weeks ago Type 2 diabetes mellitus with diabetic neuropathy, without long-term current use of insulin (HCC)   [place] [facility] [place], [place] T, NP   3 months ago Subacute cough   [place] [facility] [place], [place] T, NP   4 months ago Type 2 diabetes mellitus with diabetic neuropathy, without long-term current use of insulin (HCC)   [place] [facility] [place], Bagley T, NP   5 months ago Post-viral cough syndrome   [place] [facility] Mecum, Erin E, PA-C   10 months ago Diabetes mellitus with proteinuria (HCC)   [place] [facility] [place], Dorie Rank, NP       Future Appointments             In 2 weeks Cannady, Dorie Rank, NP [place] [facility]             metFORMIN (GLUCOPHAGE) 500 MG tablet [Pharmacy Med Name: metFORMIN HCl Oral Tablet 500 MG] 90 tablet 3    Sig: TAKE 1/2 TABLET TWICE DAILY     Endocrinology:  Diabetes - Biguanides Failed - 04/07/2023  9:48 AM      Failed - CBC within normal limits and completed in the last 12 months    WBC  Date Value Ref Range Status  03/24/2023 5.8 3.4 - 10.8 x10E3/uL Final  05/05/2022 5.1 4.0 - 10.5 K/uL Final   RBC  Date Value Ref Range Status  03/24/2023 4.29 3.77 - 5.28 x10E6/uL Final  05/05/2022 4.23 3.87 - 5.11 MIL/uL Final   Hemoglobin  Date  Value Ref Range Status  03/24/2023 11.3 11.1 - 15.9 g/dL Final   Hematocrit  Date Value Ref Range Status  03/24/2023 35.6 34.0 - 46.6 % Final   MCHC  Date Value Ref Range Status  03/24/2023 31.7 31.5 - 35.7 g/dL Final  24/40/1027 25.3 30.0 - 36.0 g/dL Final   [facility]  Date Value Ref Range Status  03/24/2023 26.3 (L) 26.6 - 33.0 pg Final  05/05/2022 26.5 26.0 - 34.0 pg Final   MCV  Date Value Ref Range Status  03/24/2023 83 79 - 97 fL Final  06/03/2013 81 80 - 100 fL Final   No results found for: "PLTCOUNTKUC", "LABPLAT", "POCPLA" RDW  Date Value Ref Range Status  03/24/2023 13.8 11.7 - 15.4 % Final  06/03/2013 13.9 11.5 - 14.5 % Final         Passed - Cr in normal range and within 360 days    Creatinine  Date Value Ref Range Status  06/03/2013 0.77 0.60 - 1.30 mg/dL Final   Creatinine, Ser  Date Value Ref Range Status  03/24/2023 0.95 0.57 -  1.00 mg/dL Final         Passed - HBA1C is between 0 and 7.9 and within 180 days    Hemoglobin A1C  Date Value Ref Range Status  03/09/2015 6.3  Final   HB A1C (BAYER DCA - WAIVED)  Date Value Ref Range Status  03/24/2023 6.7 (H) 4.8 - 5.6 % Final    Comment:             Prediabetes: 5.7 - 6.4          Diabetes: >6.4          Glycemic control for adults with diabetes: <7.0          Passed - eGFR in normal range and within 360 days    EGFR (African American)  Date Value Ref Range Status  06/03/2013 >60  Final   GFR calc Af Amer  Date Value Ref Range Status  04/10/2020 72 >59 mL/min/1.73 Final    Comment:    **In accordance with recommendations from the NKF-ASN Task force,**   [facility] is in the process of updating its eGFR calculation to the   2021 CKD-EPI creatinine equation that estimates kidney function   without a race variable.    EGFR (Non-African Amer.)  Date Value Ref Range Status  06/03/2013 >60  Final    Comment:    eGFR values <33mL/min/1.73 m2 may be an indication of chronic kidney disease  (CKD). Calculated eGFR is useful in patients with stable renal function. The eGFR calculation will not be reliable in acutely ill patients when serum creatinine is changing rapidly. It is not useful in  patients on dialysis. The eGFR calculation may not be applicable to patients at the low and high extremes of body sizes, pregnant women, and vegetarians.    GFR, Estimated  Date Value Ref Range Status  05/05/2022 >60 >60 mL/min Final    Comment:    (NOTE) Calculated using the CKD-EPI Creatinine Equation (2021)    eGFR  Date Value Ref Range Status  03/24/2023 65 >59 mL/min/1.73 Final         Passed - B12 Level in normal range and within 720 days    Vitamin B-12  Date Value Ref Range Status  03/24/2023 1,021 232 - 1,245 pg/mL Final         Passed - Valid encounter within last 6 months    Recent Outpatient Visits           2 weeks ago Type 2 diabetes mellitus with diabetic neuropathy, without long-term current use of insulin (HCC)   [place] [facility] [place], [place] T, NP   3 months ago Subacute cough   [place] [facility] [place], [place] T, NP   4 months ago Type 2 diabetes mellitus with diabetic neuropathy, without long-term current use of insulin (HCC)   [place] [facility] [place], [place] T, NP   5 months ago Post-viral cough syndrome   [place] [facility] Mecum, Erin E, PA-C   10 months ago Diabetes mellitus with proteinuria (HCC)   [place] [facility] [place], Dorie Rank, NP       Future Appointments             In 2 weeks Cannady, Dorie Rank, NP [place] [facility]             cloNIDine (CATAPRES) 0.3 MG tablet [Pharmacy Med Name: cloNIDine HCl Oral Tablet 0.3 MG] 180 tablet  3    Sig: TAKE 1 TABLET TWICE DAILY     Cardiovascular:  Alpha-2 Agonists Failed - 04/07/2023  9:48 AM      Failed - Last BP in normal range    BP Readings from Last 1  Encounters:  03/24/23 (!) 140/88         Passed - Last Heart Rate in normal range    Pulse Readings from Last 1 Encounters:  03/24/23 66         Passed - Valid encounter within last 6 months    Recent Outpatient Visits           2 weeks ago Type 2 diabetes mellitus with diabetic neuropathy, without long-term current use of insulin (HCC)   [place] [facility] [place], [place] T, NP   3 months ago Subacute cough   [place] [facility] [place], [place] T, NP   4 months ago Type 2 diabetes mellitus with diabetic neuropathy, without long-term current use of insulin (HCC)   [place] [facility] [place], [place] T, NP   5 months ago Post-viral cough syndrome   [place] [facility] Mecum, Erin E, PA-C   10 months ago Diabetes mellitus with proteinuria (HCC)   [place] [facility] [place], Corrie Dandy T, NP       Future Appointments             In 2 weeks Cannady, Dorie Rank, NP [place] [facility]             furosemide (LASIX) 20 MG tablet [Pharmacy Med Name: Furosemide Oral Tablet 20 MG] 90 tablet 3    Sig: TAKE 1 TABLET EVERY DAY     Cardiovascular:  Diuretics - Loop Failed - 04/07/2023  9:48 AM      Failed - Mg Level in normal range and within 180 days    No results found for: "MG"       Failed - Last BP in normal range    BP Readings from Last 1 Encounters:  03/24/23 (!) 140/88         Passed - K in normal range and within 180 days    Potassium  Date Value Ref Range Status  03/24/2023 4.3 3.5 - 5.2 mmol/L Final  06/03/2013 3.1 (L) 3.5 - 5.1 mmol/L Final         Passed - Ca in normal range and within 180 days    Calcium  Date Value Ref Range Status  03/24/2023 9.6 8.7 - 10.3 mg/dL Final   Calcium, Total  Date Value Ref Range Status  06/03/2013 9.4 8.5 - 10.1 mg/dL Final         Passed - Na in normal range and within 180 days    Sodium  Date Value Ref Range  Status  03/24/2023 142 134 - 144 mmol/L Final  06/03/2013 137 136 - 145 mmol/L Final         Passed - Cr in normal range and within 180 days    Creatinine  Date Value Ref Range Status  06/03/2013 0.77 0.60 - 1.30 mg/dL Final   Creatinine, Ser  Date Value Ref Range Status  03/24/2023 0.95 0.57 - 1.00 mg/dL Final         Passed - Cl in normal range and within 180 days    Chloride  Date Value Ref Range Status  03/24/2023 104 96 - 106 mmol/L Final  06/03/2013 101 98 -  107 mmol/L Final         Passed - Valid encounter within last 6 months    Recent Outpatient Visits           2 weeks ago Type 2 diabetes mellitus with diabetic neuropathy, without long-term current use of insulin (HCC)   [place] [facility] [place], [place] T, NP   3 months ago Subacute cough   [place] [facility] [place], [place] T, NP   4 months ago Type 2 diabetes mellitus with diabetic neuropathy, without long-term current use of insulin (HCC)   [place] [facility] [place], [place] T, NP   5 months ago Post-viral cough syndrome   [place] [facility] Mecum, Erin E, PA-C   10 months ago Diabetes mellitus with proteinuria (HCC)   [place] [facility] [place], Corrie Dandy T, NP       Future Appointments             In 2 weeks Cannady, Dorie Rank, NP [place] [facility]             atorvastatin (LIPITOR) 10 MG tablet [Pharmacy Med Name: Atorvastatin Calcium Oral Tablet 10 MG] 90 tablet 3    Sig: TAKE 1 TABLET EVERY DAY     Cardiovascular:  Antilipid - Statins Failed - 04/07/2023  9:48 AM      Failed - Lipid Panel in normal range within the last 12 months    Cholesterol, Total  Date Value Ref Range Status  03/24/2023 121 100 - 199 mg/dL Final   Cholesterol Piccolo, Waived  Date Value Ref Range Status  08/20/2019 121 <200 mg/dL Final    Comment:                            Desirable                <200                          Borderline High      200- 239                         High                     >239    LDL Chol Calc (NIH)  Date Value Ref Range Status  03/24/2023 58 0 - 99 mg/dL Final   HDL  Date Value Ref Range Status  03/24/2023 46 >39 mg/dL Final   Triglycerides  Date Value Ref Range Status  03/24/2023 86 0 - 149 mg/dL Final   Triglycerides Piccolo,Waived  Date Value Ref Range Status  08/20/2019 105 <150 mg/dL Final    Comment:                            Normal                   <150                         Borderline High     150 - 199                         High  200 - 499                         Very High                >499          Passed - Patient is not pregnant      Passed - Valid encounter within last 12 months    Recent Outpatient Visits           2 weeks ago Type 2 diabetes mellitus with diabetic neuropathy, without long-term current use of insulin (HCC)   [place] [facility] [place], [place] T, NP   3 months ago Subacute cough   [place] [facility] [place], [place] T, NP   4 months ago Type 2 diabetes mellitus with diabetic neuropathy, without long-term current use of insulin (HCC)   [place] [facility] [place], [place] T, NP   5 months ago Post-viral cough syndrome   [place] [facility] Mecum, Erin E, PA-C   10 months ago Diabetes mellitus with proteinuria (HCC)   [place] [facility] [place], Dorie Rank, NP       Future Appointments             In 2 weeks Cannady, Dorie Rank, NP [place] [facility]

## 2023-04-19 NOTE — Patient Instructions (Signed)

## 2023-04-21 ENCOUNTER — Ambulatory Visit (INDEPENDENT_AMBULATORY_CARE_PROVIDER_SITE_OTHER): Payer: Medicare PPO | Admitting: Nurse Practitioner

## 2023-04-21 ENCOUNTER — Encounter: Payer: Self-pay | Admitting: Nurse Practitioner

## 2023-04-21 VITALS — BP 136/86 | HR 91 | Temp 97.8°F | Ht 60.0 in | Wt 253.4 lb

## 2023-04-21 DIAGNOSIS — M545 Low back pain, unspecified: Secondary | ICD-10-CM | POA: Diagnosis not present

## 2023-04-21 DIAGNOSIS — Z7984 Long term (current) use of oral hypoglycemic drugs: Secondary | ICD-10-CM

## 2023-04-21 DIAGNOSIS — R809 Proteinuria, unspecified: Secondary | ICD-10-CM

## 2023-04-21 DIAGNOSIS — E1129 Type 2 diabetes mellitus with other diabetic kidney complication: Secondary | ICD-10-CM

## 2023-04-21 DIAGNOSIS — E1159 Type 2 diabetes mellitus with other circulatory complications: Secondary | ICD-10-CM

## 2023-04-21 DIAGNOSIS — I152 Hypertension secondary to endocrine disorders: Secondary | ICD-10-CM

## 2023-04-21 MED ORDER — PREDNISONE 10 MG (21) PO TBPK: ORAL_TABLET | ORAL | 0 refills | Status: DC

## 2023-04-21 MED ORDER — SEMAGLUTIDE(0.25 OR 0.5MG/DOS) 2 MG/3ML ~~LOC~~ SOPN: PEN_INJECTOR | SUBCUTANEOUS | 12 refills | Status: DC

## 2023-04-21 NOTE — Assessment & Plan Note (Signed)
Chronic, ongoing with recent A1c levels below goal, 6.7% recent check.  Urine ALB 68 February 2024, taking ARB.  Will start Ozempic, have educated her on this, and take off Metformin in future if stable levels with Ozempic only. Continue daily B12 supplement for low levels in past. Continue Gabapentin at night.   - Eye and foot exams up to date - Vaccinations up to date - ARB and statin on board

## 2023-04-21 NOTE — Assessment & Plan Note (Signed)
Acute for one week. Will start Prednisone taper and to monitor sugars closely. Will continue Tizanidine PRN, which she has taken before and tolerated, recommend she take only as needed and not if driving or working.  Continue at home regimen and cut back on Ibuprofen and Meloxicam due to BP.

## 2023-04-21 NOTE — Assessment & Plan Note (Signed)
Chronic, stable.  BP improving on recheck today, is having back pain.  Will continue ARB (Olmesartan 40 MG daily), was on ACE but having improved cough off of this.  Recommend checking BP at home three mornings a week and documenting for provider + focus on DASH diet.  LABS: up to date.  Could consider changing to Valsartan in future if ongoing elevations in BP.

## 2023-04-21 NOTE — Progress Notes (Signed)
BP 136/86 (BP Location: Left Arm, Patient Position: Sitting)   Pulse 91   Temp 97.8 F (36.6 C) (Oral)   Ht 5' (1.524 m)   Wt 253 lb 6.4 oz (114.9 kg)   SpO2 96%   BMI 49.49 kg/m    Subjective:    Patient ID: Jillian Pierce, female    DOB: [birth year], 67 y.o.   MRN: [id number]  HPI: JOSY HAMED is a 67 y.o. female  Chief Complaint  Patient presents with   Back Pain    Patient states her back pain is getting better since last visit   Hypertension   BACK PAIN Follow-up today for back pain which she reports is improving.  Still a little bit at the bottom. Duration: months Mechanism of injury: no trauma Location: midline and low back Onset: gradual Severity: 5/10 Quality: dull, aching, and throbbing Frequency: constant Radiation: gets numbness to both feet Aggravating factors: moving from sitting to standing Alleviating factors: nothing Status:  improving Treatments attempted: Tizanidine, Tylenol, heat pad  Relief with NSAIDs?: No NSAIDs Taken Nighttime pain:  no Paresthesias / decreased sensation:  no Bowel / bladder incontinence:  no Fevers:  no Dysuria / urinary frequency:  no   HYPERTENSION without Chronic Kidney Disease Increased Olmesartan last visit to 40 MG, continues Amlodipine, Lasix, and Clonidine.  Wanting to start Ozempic for her diabetes, son takes it and has lost weight.  No family history of thyroid cancer (MTC, MEN 2, thyroid cell tumors) or pancreatitis.   Last A1c December was 6.7%. Hypertension status: improving Satisfied with current treatment? yes Duration of hypertension: chronic BP monitoring frequency:  not checking BP range:  BP medication side effects:  no Medication compliance: good compliance Previous BP meds: as above Aspirin: no Recurrent headaches: no Visual changes: no Palpitations: no Dyspnea:  if walks a lot Chest pain: no Lower extremity edema: no Dizzy/lightheaded: no   Relevant past medical, surgical, family and social  history reviewed and updated as indicated. Interim medical history since our last visit reviewed. Allergies and medications reviewed and updated.  Review of Systems  Constitutional:  Negative for activity change, appetite change, diaphoresis, fatigue and fever.  Respiratory:  Positive for shortness of breath (at baseline, no worsening). Negative for cough, chest tightness and wheezing.   Cardiovascular:  Positive for leg swelling (improved). Negative for chest pain and palpitations.  Gastrointestinal: Negative.   Endocrine: Negative for polydipsia, polyphagia and polyuria.  Musculoskeletal:  Positive for back pain.  Neurological: Negative.   Psychiatric/Behavioral: Negative.      Per HPI unless specifically indicated above     Objective:    BP 136/86 (BP Location: Left Arm, Patient Position: Sitting)   Pulse 91   Temp 97.8 F (36.6 C) (Oral)   Ht 5' (1.524 m)   Wt 253 lb 6.4 oz (114.9 kg)   SpO2 96%   BMI 49.49 kg/m   Wt Readings from Last 3 Encounters:  04/21/23 253 lb 6.4 oz (114.9 kg)  03/24/23 259 lb (117.5 kg)  12/18/22 259 lb 12.8 oz (117.8 kg)    Physical Exam Vitals and nursing note reviewed.  Constitutional:      General: She is awake. She is not in acute distress.    Appearance: She is well-developed and well-groomed. She is morbidly obese. She is not ill-appearing.  HENT:     Head: Normocephalic.     Right Ear: Hearing normal.     Left Ear: Hearing normal.  Eyes:  General: Lids are normal.        Right eye: No discharge.        Left eye: No discharge.     Conjunctiva/sclera: Conjunctivae normal.     Pupils: Pupils are equal, round, and reactive to light.  Neck:     Thyroid: No thyromegaly.     Vascular: No carotid bruit.  Cardiovascular:     Rate and Rhythm: Normal rate and regular rhythm.     Heart sounds: Murmur heard.     Systolic murmur is present with a grade of 2/6.     No gallop.  Pulmonary:     Effort: Pulmonary effort is normal. No  accessory muscle usage or respiratory distress.     Breath sounds: Normal breath sounds.  Abdominal:     General: Bowel sounds are normal.     Palpations: Abdomen is soft.  Musculoskeletal:     Cervical back: Normal range of motion and neck supple.     Lumbar back: Tenderness (mid lower back) present. No swelling, spasms or bony tenderness. Decreased range of motion (with flexion and extension). Negative right straight leg raise test and negative left straight leg raise test.     Right lower leg: 1+ Pitting Edema present.     Left lower leg: 1+ Pitting Edema present.  Skin:    General: Skin is warm and dry.  Neurological:     Mental Status: She is alert and oriented to person, place, and time.  Psychiatric:        Attention and Perception: Attention normal.        Mood and Affect: Mood normal.        Speech: Speech normal.        Behavior: Behavior normal. Behavior is cooperative.        Thought Content: Thought content normal.    Results for orders placed or performed in visit on 03/24/23  Bayer DCA Hb A1c Waived   Collection Time: 03/24/23  9:51 AM  Result Value Ref Range   HB A1C (BAYER DCA - WAIVED) 6.7 (H) 4.8 - 5.6 %  Comprehensive metabolic panel   Collection Time: 03/24/23  9:52 AM  Result Value Ref Range   Glucose 128 (H) 70 - 99 mg/dL   BUN 16 8 - 27 mg/dL   Creatinine, Ser 3.47 0.57 - 1.00 mg/dL   eGFR 65 >42 VZ/DGL/8.75   BUN/Creatinine Ratio 17 12 - 28   Sodium 142 134 - 144 mmol/L   Potassium 4.3 3.5 - 5.2 mmol/L   Chloride 104 96 - 106 mmol/L   CO2 22 20 - 29 mmol/L   Calcium 9.6 8.7 - 10.3 mg/dL   Total Protein 7.1 6.0 - 8.5 g/dL   Albumin 4.3 3.9 - 4.9 g/dL   Globulin, Total 2.8 1.5 - 4.5 g/dL   Bilirubin Total 0.5 0.0 - 1.2 mg/dL   Alkaline Phosphatase 92 44 - 121 IU/L   AST 15 0 - 40 IU/L   ALT 10 0 - 32 IU/L  Lipid Panel w/o Chol/HDL Ratio   Collection Time: 03/24/23  9:52 AM  Result Value Ref Range   Cholesterol, Total 121 100 - 199 mg/dL    Triglycerides 86 0 - 149 mg/dL   HDL 46 >64 mg/dL   VLDL Cholesterol Cal 17 5 - 40 mg/dL   LDL Chol Calc (NIH) 58 0 - 99 mg/dL  CBC with Differential/Platelet   Collection Time: 03/24/23  9:52 AM  Result Value Ref Range  WBC 5.8 3.4 - 10.8 x10E3/uL   RBC 4.29 3.77 - 5.28 x10E6/uL   Hemoglobin 11.3 11.1 - 15.9 g/dL   Hematocrit 60.4 54.0 - 46.6 %   MCV 83 79 - 97 fL   MCH 26.3 (L) 26.6 - 33.0 pg   MCHC 31.7 31.5 - 35.7 g/dL   RDW 98.1 19.1 - 47.8 %   Platelets 434 150 - 450 x10E3/uL   Neutrophils 58 Not Estab. %   Lymphs 29 Not Estab. %   Monocytes 8 Not Estab. %   Eos 4 Not Estab. %   Basos 1 Not Estab. %   Neutrophils Absolute 3.4 1.4 - 7.0 x10E3/uL   Lymphocytes Absolute 1.7 0.7 - 3.1 x10E3/uL   Monocytes Absolute 0.5 0.1 - 0.9 x10E3/uL   EOS (ABSOLUTE) 0.2 0.0 - 0.4 x10E3/uL   Basophils Absolute 0.0 0.0 - 0.2 x10E3/uL   Immature Granulocytes 0 Not Estab. %   Immature Grans (Abs) 0.0 0.0 - 0.1 x10E3/uL  Vitamin B12   Collection Time: 03/24/23  9:52 AM  Result Value Ref Range   Vitamin B-12 1,021 232 - 1,245 pg/mL  TSH   Collection Time: 03/24/23  9:52 AM  Result Value Ref Range   TSH 1.120 0.450 - 4.500 uIU/mL  VITAMIN D 25 Hydroxy (Vit-D Deficiency, Fractures)   Collection Time: 03/24/23  9:52 AM  Result Value Ref Range   Vit D, 25-Hydroxy 34.4 30.0 - 100.0 ng/mL      Assessment & Plan:   Problem List Items Addressed This Visit       Cardiovascular and Mediastinum   Hypertension associated with diabetes (HCC)   Chronic, stable.  BP improving on recheck today, is having back pain.  Will continue ARB (Olmesartan 40 MG daily), was on ACE but having improved cough off of this.  Recommend checking BP at home three mornings a week and documenting for provider + focus on DASH diet.  LABS: up to date.  Could consider changing to Valsartan in future if ongoing elevations in BP.      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  Urine ALB 68 February 2024, taking ARB.  Will start Ozempic, have educated her on this, and take off Metformin in future if stable levels with Ozempic only. Continue daily B12 supplement for low levels in past. Continue Gabapentin at night.   - Eye and foot exams up to date - Vaccinations up to date - ARB and statin on board      Relevant Medications   Semaglutide,0.25 or 0.5MG /DOS, 2 MG/3ML SOPN     Other   Acute lumbar back pain   Acute for one week. Will start Prednisone taper and to monitor sugars closely. Will continue Tizanidine PRN, which she has taken before and tolerated, recommend she take only as needed and not if driving or working.  Continue at home regimen and cut back on Ibuprofen and Meloxicam due to BP.      Relevant Medications   predniSONE (STERAPRED UNI-PAK 21 TAB) 10 MG (21) TBPK tablet     Follow up plan: Return in about 3 weeks (around 05/12/2023) for Back Pain and T2DM -- started Ozempic.

## 2023-05-04 ENCOUNTER — Emergency Department: Payer: Medicare PPO

## 2023-05-04 ENCOUNTER — Emergency Department
Admission: EM | Admit: 2023-05-04 | Discharge: 2023-05-04 | Disposition: A | Discharge status: Home / Self Care | Payer: Medicare PPO | Attending: Emergency Medicine | Admitting: Emergency Medicine

## 2023-05-04 DIAGNOSIS — M5416 Radiculopathy, lumbar region: Secondary | ICD-10-CM | POA: Insufficient documentation

## 2023-05-04 DIAGNOSIS — E119 Type 2 diabetes mellitus without complications: Secondary | ICD-10-CM | POA: Insufficient documentation

## 2023-05-04 DIAGNOSIS — M545 Low back pain, unspecified: Secondary | ICD-10-CM | POA: Diagnosis present

## 2023-05-04 DIAGNOSIS — I10 Essential (primary) hypertension: Secondary | ICD-10-CM | POA: Insufficient documentation

## 2023-05-04 DIAGNOSIS — M5135 Other intervertebral disc degeneration, thoracolumbar region: Secondary | ICD-10-CM | POA: Diagnosis not present

## 2023-05-04 DIAGNOSIS — M48061 Spinal stenosis, lumbar region without neurogenic claudication: Secondary | ICD-10-CM | POA: Diagnosis not present

## 2023-05-04 DIAGNOSIS — M5126 Other intervertebral disc displacement, lumbar region: Secondary | ICD-10-CM | POA: Diagnosis not present

## 2023-05-04 DIAGNOSIS — M47816 Spondylosis without myelopathy or radiculopathy, lumbar region: Secondary | ICD-10-CM | POA: Diagnosis not present

## 2023-05-04 MED ORDER — OXYCODONE-ACETAMINOPHEN 5-325 MG PO TABS
1.0000 | ORAL_TABLET | Freq: Once | ORAL | Status: AC
  Administered 2023-05-04: 1 via ORAL
  Filled 2023-05-04: qty 1

## 2023-05-04 MED ORDER — OXYCODONE-ACETAMINOPHEN 5-325 MG PO TABS: 1.0000 | ORAL_TABLET | ORAL | 0 refills | Status: AC | PRN

## 2023-05-04 NOTE — ED Triage Notes (Signed)
 Pt c/o low back pain radiating down BLEs x3 weeks.  Denies injury and GU complaints.  Pain score 10/10.  Pt reports completing a course of prednisone w/o relief.

## 2023-05-04 NOTE — ED Notes (Signed)
 Pt to MRI

## 2023-05-04 NOTE — ED Provider Notes (Signed)
 [facility] Provider Note    Event Date/Time   First MD Initiated Contact with Patient 05/04/23 1758     (approximate)   History   Chief Complaint: Back Pain   HPI  Jillian Pierce is a 68 y.o. female with a history of diabetes, hypertension, obesity who comes ED complaining of low back pain for the past 3 weeks, radiating down both legs to the feet, associated diminished sensation in both feet.  Rates it as severe.  Worse with movement.  No fever chills shortness of breath.  Denies bowel or bladder incontinence or retention.  Denies groin paresthesia          Physical Exam   Triage Vital Signs: ED Triage Vitals [05/04/23 1538]  Encounter Vitals Group     BP (!) 179/119     Systolic BP Percentile      Diastolic BP Percentile      Pulse Rate 88     Resp 18     Temp 98.5 F (36.9 C)     Temp Source Oral     SpO2 95 %     Weight 253 lb (114.8 kg)     Height 5' (1.524 m)     Head Circumference      Peak Flow      Pain Score 10     Pain Loc      Pain Education      Exclude from Growth Chart     Most recent vital signs: Vitals:   05/04/23 1829 05/04/23 2053  BP: (!) 192/112 (!) 176/94  Pulse: 81 66  Resp: 16 17  Temp:    SpO2: 99% 100%    General: Awake, no distress.  CV:  Good peripheral perfusion.  Resp:  Normal effort.  Abd:  No distention.  Soft nontender Other:  No midline spinal tenderness.   ED Results / Procedures / Treatments   Labs (all labs ordered are listed, but only abnormal results are displayed) Labs Reviewed - No data to display   EKG    RADIOLOGY MRI lumbar spine radiology report reviewed, revealing severe spinal stenosis and multilevel foraminal stenosis/impingement   PROCEDURES:  Procedures   MEDICATIONS ORDERED IN ED: Medications  oxyCODONE -acetaminophen  (PERCOCET/ROXICET) 5-325 MG per tablet 1 tablet (1 tablet Oral Given 05/04/23 1824)     IMPRESSION / MDM / ASSESSMENT AND PLAN / ED  COURSE  I reviewed the triage vital signs and the nursing notes.  DDx: Cauda equina syndrome, spinal stenosis, spinal compression fracture, chronic radiculopathy  Patient's presentation is most consistent with acute presentation with potential threat to life or bodily function.  Patient presents with low back pain with bilateral neurologic symptoms.  Reviewed outside records noting MRI from 2013 which showed L4-L5 bilateral foraminal impingement.  Will need to repeat MRI today due to recurrent and worsening symptoms.   Clinical Course as of 05/04/23 2251  Jillian Pierce May 04, 2023  2035 Pain improved. MRi shows lumbar spinal stenosis and foraminal stenosis. Will refer to orthopedics. Counseled pt. No cauda equina. Stable for DC. [PS]    Clinical Course User Index [PS] Viviann Pastor, MD     FINAL CLINICAL IMPRESSION(S) / ED DIAGNOSES   Final diagnoses:  Spinal stenosis of lumbar region, unspecified whether neurogenic claudication present  Bilateral lumbar radiculopathy     Rx / DC Orders   ED Discharge Orders          Ordered    oxyCODONE -acetaminophen  (PERCOCET) 5-325 MG  tablet  Every 4 hours PRN        05/04/23 2034             Note:  This document was prepared using Dragon voice recognition software and may include unintentional dictation errors.   Viviann Pastor, MD 05/04/23 2251

## 2023-05-04 NOTE — ED Notes (Signed)
 Pt complains of lower back pain since few weeks, is able to move all extremities, denies incontinence. Uses cane.

## 2023-05-05 ENCOUNTER — Telehealth: Payer: Self-pay

## 2023-05-06 NOTE — Telephone Encounter (Signed)
 Created in error

## 2023-05-12 NOTE — Patient Instructions (Signed)
Acute Back Pain, Adult Acute back pain is sudden and usually short-lived. It is often caused by an injury to the muscles and tissues in the back. The injury may result from: A muscle, tendon, or ligament getting overstretched or torn. Ligaments are tissues that connect bones to each other. Lifting something improperly can cause a back strain. Wear and tear (degeneration) of the spinal disks. Spinal disks are circular tissue that provide cushioning between the bones of the spine (vertebrae). Twisting motions, such as while playing sports or doing yard work. A hit to the back. Arthritis. You may have a physical exam, lab tests, and imaging tests to find the cause of your pain. Acute back pain usually goes away with rest and home care. Follow these instructions at home: Managing pain, stiffness, and swelling Take over-the-counter and prescription medicines only as told by your health care provider. Treatment may include medicines for pain and inflammation that are taken by mouth or applied to the skin, or muscle relaxants. Your health care provider may recommend applying ice during the first 24-48 hours after your pain starts. To do this: Put ice in a plastic bag. Place a towel between your skin and the bag. Leave the ice on for 20 minutes, 2-3 times a day. Remove the ice if your skin turns bright red. This is very important. If you cannot feel pain, heat, or cold, you have a greater risk of damage to the area. If directed, apply heat to the affected area as often as told by your health care provider. Use the heat source that your health care provider recommends, such as a moist heat pack or a heating pad. Place a towel between your skin and the heat source. Leave the heat on for 20-30 minutes. Remove the heat if your skin turns bright red. This is especially important if you are unable to feel pain, heat, or cold. You have a greater risk of getting burned. Activity  Do not stay in bed. Staying in  bed for more than 1-2 days can delay your recovery. Sit up and stand up straight. Avoid leaning forward when you sit or hunching over when you stand. If you work at a desk, sit close to it so you do not need to lean over. Keep your chin tucked in. Keep your neck drawn back, and keep your elbows bent at a 90-degree angle (right angle). Sit high and close to the steering wheel when you drive. Add lower back (lumbar) support to your car seat, if needed. Take short walks on even surfaces as soon as you are able. Try to increase the length of time you walk each day. Do not sit, drive, or stand in one place for more than 30 minutes at a time. Sitting or standing for long periods of time can put stress on your back. Do not drive or use heavy machinery while taking prescription pain medicine. Use proper lifting techniques. When you bend and lift, use positions that put less stress on your back: Bend your knees. Keep the load close to your body. Avoid twisting. Exercise regularly as told by your health care provider. Exercising helps your back heal faster and helps prevent back injuries by keeping muscles strong and flexible. Work with a physical therapist to make a safe exercise program, as recommended by your health care provider. Do any exercises as told by your physical therapist. Lifestyle Maintain a healthy weight. Extra weight puts stress on your back and makes it difficult to have good   posture. Avoid activities or situations that make you feel anxious or stressed. Stress and anxiety increase muscle tension and can make back pain worse. Learn ways to manage anxiety and stress, such as through exercise. General instructions Sleep on a firm mattress in a comfortable position. Try lying on your side with your knees slightly bent. If you lie on your back, put a pillow under your knees. Keep your head and neck in a straight line with your spine (neutral position) when using electronic equipment like  smartphones or pads. To do this: Raise your smartphone or pad to look at it instead of bending your head or neck to look down. Put the smartphone or pad at the level of your face while looking at the screen. Follow your treatment plan as told by your health care provider. This may include: Cognitive or behavioral therapy. Acupuncture or massage therapy. Meditation or yoga. Contact a health care provider if: You have pain that is not relieved with rest or medicine. You have increasing pain going down into your legs or buttocks. Your pain does not improve after 2 weeks. You have pain at night. You lose weight without trying. You have a fever or chills. You develop nausea or vomiting. You develop abdominal pain. Get help right away if: You develop new bowel or bladder control problems. You have unusual weakness or numbness in your arms or legs. You feel faint. These symptoms may represent a serious problem that is an emergency. Do not wait to see if the symptoms will go away. Get medical help right away. Call your local emergency services (911 in the [place] Do not drive yourself to the hospital. Summary Acute back pain is sudden and usually short-lived. Use proper lifting techniques. When you bend and lift, use positions that put less stress on your back. Take over-the-counter and prescription medicines only as told by your health care provider, and apply heat or ice as told. This information is not intended to replace advice given to you by your health care provider. Make sure you discuss any questions you have with your health care provider. Document Revised: 06/30/2020 Document Reviewed: 06/30/2020 Elsevier Patient Education  2024 [employer]  

## 2023-05-13 DIAGNOSIS — E1159 Type 2 diabetes mellitus with other circulatory complications: Secondary | ICD-10-CM | POA: Diagnosis not present

## 2023-05-13 DIAGNOSIS — I152 Hypertension secondary to endocrine disorders: Secondary | ICD-10-CM | POA: Diagnosis not present

## 2023-05-13 DIAGNOSIS — M51369 Other intervertebral disc degeneration, lumbar region without mention of lumbar back pain or lower extremity pain: Secondary | ICD-10-CM | POA: Diagnosis not present

## 2023-05-13 DIAGNOSIS — M48062 Spinal stenosis, lumbar region with neurogenic claudication: Secondary | ICD-10-CM | POA: Diagnosis not present

## 2023-05-13 DIAGNOSIS — M47816 Spondylosis without myelopathy or radiculopathy, lumbar region: Secondary | ICD-10-CM | POA: Diagnosis not present

## 2023-05-16 ENCOUNTER — Ambulatory Visit (INDEPENDENT_AMBULATORY_CARE_PROVIDER_SITE_OTHER): Payer: Medicare PPO | Admitting: Nurse Practitioner

## 2023-05-16 ENCOUNTER — Encounter: Payer: Self-pay | Admitting: Nurse Practitioner

## 2023-05-16 VITALS — BP 130/82 | HR 78 | Temp 98.8°F | Ht 60.0 in | Wt 250.4 lb

## 2023-05-16 DIAGNOSIS — M545 Low back pain, unspecified: Secondary | ICD-10-CM

## 2023-05-16 DIAGNOSIS — Z7984 Long term (current) use of oral hypoglycemic drugs: Secondary | ICD-10-CM | POA: Diagnosis not present

## 2023-05-16 DIAGNOSIS — E1140 Type 2 diabetes mellitus with diabetic neuropathy, unspecified: Secondary | ICD-10-CM | POA: Diagnosis not present

## 2023-05-16 MED ORDER — SEMAGLUTIDE(0.25 OR 0.5MG/DOS) 2 MG/3ML ~~LOC~~ SOPN: PEN_INJECTOR | SUBCUTANEOUS | 12 refills | Status: DC

## 2023-05-16 MED ORDER — METFORMIN HCL 500 MG PO TABS: ORAL_TABLET | ORAL | 4 refills | Status: DC

## 2023-05-16 MED ORDER — GABAPENTIN 600 MG PO TABS: ORAL_TABLET | ORAL | 4 refills | Status: DC

## 2023-05-16 MED ORDER — CLONIDINE HCL 0.3 MG PO TABS: 0.3000 mg | ORAL_TABLET | Freq: Two times a day (BID) | ORAL | 4 refills | Status: DC

## 2023-05-16 MED ORDER — FUROSEMIDE 20 MG PO TABS: 20.0000 mg | ORAL_TABLET | Freq: Every day | ORAL | 4 refills | Status: DC

## 2023-05-16 MED ORDER — ATORVASTATIN CALCIUM 10 MG PO TABS: 10.0000 mg | ORAL_TABLET | Freq: Every day | ORAL | 4 refills | Status: DC

## 2023-05-16 NOTE — Assessment & Plan Note (Signed)
Chronic, ongoing with recent A1c levels below goal, 6.7% in December.  Urine ALB 68 February 2024, taking ARB. Tolerating Ozempic, will continue this with plan to stop Metformin in future.  Continue daily B12 supplement for low levels in past.  Continue Gabapentin at night.  Return in 3 months. - Eye and foot exams up to date - Vaccinations up to date - ARB and statin on board (she reports taking Olmesartan and not Valsartan -- she will alert provider if different)

## 2023-05-16 NOTE — Assessment & Plan Note (Signed)
Ongoing, is now following with ortho who have placed PT referral.  She is waiting to hear from PT, this would be beneficial.  Continue Tylenol and Gabapentin.  Will increase Gabapentin to 300 MG morning and 600 MG at night per ortho recommendations.  Continue this collaboration, recent note reviewed.

## 2023-05-16 NOTE — Progress Notes (Signed)
BP 130/82 (BP Location: Left Arm, Patient Position: Sitting, Cuff Size: Large)   Pulse 78   Temp 98.8 F (37.1 C) (Oral)   Ht 5' (1.524 m)   Wt 250 lb 6.4 oz (113.6 kg)   SpO2 96%   BMI 48.90 kg/m    Subjective:    Patient ID: Jillian Pierce, female    DOB: 05/14/1955, 68 y.o.   MRN: [id number]  HPI: Jillian Pierce is a 68 y.o. female  Chief Complaint  Patient presents with   Back Pain   Diabetes   Started Ozempic on 04/20/24 and overall tolerating well.  No ADR.  Last A1c 6.7%. She does notice her appetite reducing some.  Has lost 3 lbs.   BACK PAIN Follow-up for back pain, was improving last visit on 04/21/23. Then on 05/04/23 it started to get worse, she went to ER.  Saw ortho on 05/13/23 and they increased Gabapentin to 300 MG twice a day + recommend if in 4 days still can increase to 600 MG Duration: months Mechanism of injury: lifting Location: midline and low back Onset: gradual Severity: 2/10 Quality: dull, aching, and throbbing Frequency: constant Radiation: numbness to both feet Aggravating factors: from sitting to standing Alleviating factors: Tylenol & Gabapentin Status: fluctuating Treatments attempted: Tizanidine, Tylenol, heat pad, Gabapentin Relief with NSAIDs?: No NSAIDs Taken Nighttime pain:  no Paresthesias / decreased sensation:  just to feet bilaterally Bowel / bladder incontinence:  no Fevers:  no Dysuria / urinary frequency:  no   Relevant past medical, surgical, family and social history reviewed and updated as indicated. Interim medical history since our last visit reviewed. Allergies and medications reviewed and updated.  Review of Systems  Constitutional:  Negative for activity change, appetite change, diaphoresis, fatigue and fever.  Respiratory:  Negative for cough, chest tightness, shortness of breath and wheezing.   Cardiovascular:  Negative for chest pain, palpitations and leg swelling.  Gastrointestinal: Negative.   Endocrine:  Negative for polydipsia, polyphagia and polyuria.  Musculoskeletal:  Positive for back pain.  Psychiatric/Behavioral: Negative.     Per HPI unless specifically indicated above     Objective:    BP 130/82 (BP Location: Left Arm, Patient Position: Sitting, Cuff Size: Large)   Pulse 78   Temp 98.8 F (37.1 C) (Oral)   Ht 5' (1.524 m)   Wt 250 lb 6.4 oz (113.6 kg)   SpO2 96%   BMI 48.90 kg/m   Wt Readings from Last 3 Encounters:  05/16/23 250 lb 6.4 oz (113.6 kg)  05/04/23 253 lb (114.8 kg)  04/21/23 253 lb 6.4 oz (114.9 kg)    Physical Exam Vitals and nursing note reviewed.  Constitutional:      General: She is awake. She is not in acute distress.    Appearance: She is well-developed and well-groomed. She is morbidly obese. She is not ill-appearing.  HENT:     Head: Normocephalic.     Right Ear: Hearing normal.     Left Ear: Hearing normal.  Eyes:     General: Lids are normal.        Right eye: No discharge.        Left eye: No discharge.     Conjunctiva/sclera: Conjunctivae normal.     Pupils: Pupils are equal, round, and reactive to light.  Neck:     Thyroid: No thyromegaly.     Vascular: No carotid bruit.  Cardiovascular:     Rate and Rhythm: Normal rate and regular  rhythm.     Heart sounds: Murmur heard.     Systolic murmur is present with a grade of 2/6.     No gallop.  Pulmonary:     Effort: Pulmonary effort is normal. No accessory muscle usage or respiratory distress.     Breath sounds: Normal breath sounds.  Abdominal:     General: Bowel sounds are normal.     Palpations: Abdomen is soft.  Musculoskeletal:     Cervical back: Normal range of motion and neck supple.     Lumbar back: Tenderness (mid lower back) present. No swelling, spasms or bony tenderness. Decreased range of motion (with flexion and extension). Negative right straight leg raise test and negative left straight leg raise test.     Right lower leg: 1+ Pitting Edema present.     Left lower  leg: 1+ Pitting Edema present.  Skin:    General: Skin is warm and dry.  Neurological:     Mental Status: She is alert and oriented to person, place, and time.  Psychiatric:        Attention and Perception: Attention normal.        Mood and Affect: Mood normal.        Speech: Speech normal.        Behavior: Behavior normal. Behavior is cooperative.        Thought Content: Thought content normal.     Results for orders placed or performed in visit on 03/24/23  Bayer DCA Hb A1c Waived   Collection Time: 03/24/23  9:51 AM  Result Value Ref Range   HB A1C (BAYER DCA - WAIVED) 6.7 (H) 4.8 - 5.6 %  Comprehensive metabolic panel   Collection Time: 03/24/23  9:52 AM  Result Value Ref Range   Glucose 128 (H) 70 - 99 mg/dL   BUN 16 8 - 27 mg/dL   Creatinine, Ser 7.82 0.57 - 1.00 mg/dL   eGFR 65 >95 AO/ZHY/8.65   BUN/Creatinine Ratio 17 12 - 28   Sodium 142 134 - 144 mmol/L   Potassium 4.3 3.5 - 5.2 mmol/L   Chloride 104 96 - 106 mmol/L   CO2 22 20 - 29 mmol/L   Calcium 9.6 8.7 - 10.3 mg/dL   Total Protein 7.1 6.0 - 8.5 g/dL   Albumin 4.3 3.9 - 4.9 g/dL   Globulin, Total 2.8 1.5 - 4.5 g/dL   Bilirubin Total 0.5 0.0 - 1.2 mg/dL   Alkaline Phosphatase 92 44 - 121 IU/L   AST 15 0 - 40 IU/L   ALT 10 0 - 32 IU/L  Lipid Panel w/o Chol/HDL Ratio   Collection Time: 03/24/23  9:52 AM  Result Value Ref Range   Cholesterol, Total 121 100 - 199 mg/dL   Triglycerides 86 0 - 149 mg/dL   HDL 46 >78 mg/dL   VLDL Cholesterol Cal 17 5 - 40 mg/dL   LDL Chol Calc (NIH) 58 0 - 99 mg/dL  CBC with Differential/Platelet   Collection Time: 03/24/23  9:52 AM  Result Value Ref Range   WBC 5.8 3.4 - 10.8 x10E3/uL   RBC 4.29 3.77 - 5.28 x10E6/uL   Hemoglobin 11.3 11.1 - 15.9 g/dL   Hematocrit 46.9 62.9 - 46.6 %   MCV 83 79 - 97 fL   MCH 26.3 (L) 26.6 - 33.0 pg   MCHC 31.7 31.5 - 35.7 g/dL   RDW 52.8 41.3 - 24.4 %   Platelets 434 150 - 450 x10E3/uL   Neutrophils  58 Not Estab. %   Lymphs 29 Not  Estab. %   Monocytes 8 Not Estab. %   Eos 4 Not Estab. %   Basos 1 Not Estab. %   Neutrophils Absolute 3.4 1.4 - 7.0 x10E3/uL   Lymphocytes Absolute 1.7 0.7 - 3.1 x10E3/uL   Monocytes Absolute 0.5 0.1 - 0.9 x10E3/uL   EOS (ABSOLUTE) 0.2 0.0 - 0.4 x10E3/uL   Basophils Absolute 0.0 0.0 - 0.2 x10E3/uL   Immature Granulocytes 0 Not Estab. %   Immature Grans (Abs) 0.0 0.0 - 0.1 x10E3/uL  Vitamin B12   Collection Time: 03/24/23  9:52 AM  Result Value Ref Range   Vitamin B-12 1,021 232 - 1,245 pg/mL  TSH   Collection Time: 03/24/23  9:52 AM  Result Value Ref Range   TSH 1.120 0.450 - 4.500 uIU/mL  VITAMIN D 25 Hydroxy (Vit-D Deficiency, Fractures)   Collection Time: 03/24/23  9:52 AM  Result Value Ref Range   Vit D, 25-Hydroxy 34.4 30.0 - 100.0 ng/mL      Assessment & Plan:   Problem List Items Addressed This Visit       Endocrine   Type 2 diabetes mellitus with diabetic neuropathy, unspecified (HCC) - Primary   Chronic, ongoing with recent A1c levels below goal, 6.7% in December.  Urine ALB 68 February 2024, taking ARB. Tolerating Ozempic, will continue this with plan to stop Metformin in future.  Continue daily B12 supplement for low levels in past.  Continue Gabapentin at night.  Return in 3 months. - Eye and foot exams up to date - Vaccinations up to date - ARB and statin on board (she reports taking Olmesartan and not Valsartan -- she will alert provider if different)      Relevant Medications   Semaglutide,0.25 or 0.5MG /DOS, 2 MG/3ML SOPN   metFORMIN (GLUCOPHAGE) 500 MG tablet   furosemide (LASIX) 20 MG tablet   cloNIDine (CATAPRES) 0.3 MG tablet   atorvastatin (LIPITOR) 10 MG tablet     Other   Acute lumbar back pain   Ongoing, is now following with ortho who have placed PT referral.  She is waiting to hear from PT, this would be beneficial.  Continue Tylenol and Gabapentin.  Will increase Gabapentin to 300 MG morning and 600 MG at night per ortho recommendations.   Continue this collaboration, recent note reviewed.        Follow up plan: Return in about 5 weeks (around 06/23/2023) for T2DM, HTN/HLD, BACK PAIN.

## 2023-05-26 ENCOUNTER — Ambulatory Visit: Payer: Self-pay | Admitting: *Deleted

## 2023-05-26 DIAGNOSIS — M47816 Spondylosis without myelopathy or radiculopathy, lumbar region: Secondary | ICD-10-CM | POA: Diagnosis not present

## 2023-05-26 DIAGNOSIS — M48062 Spinal stenosis, lumbar region with neurogenic claudication: Secondary | ICD-10-CM | POA: Diagnosis not present

## 2023-05-26 NOTE — Telephone Encounter (Signed)
  Chief Complaint: The CMA, [place] is calling from a provider's office at the [facility].  Pt is there and her BP is 214/107.  She did not take her BP medicine this morning.   (Looking back at her OV from 05/15/2023 and in Dec. 2024 her BP readings were fine at [facility]).    Pt said she did not take her BP medicine this morning because it makes her urinate a lot and she didn't want to take it before coming in for this doctor's office visit.   She will take it when she gets home.   She does not have a way to monitor her BP at home.   Her sister has a BP cuff who can check it for her. The CMA is going to instruct the pt to take her BP medication when she gets home then 2 hours after taking it to call the reading into [facility].     Symptoms: Denies having a headache, chest pain. Frequency: now in another provider's office at the [facility]. Pertinent Negatives: Patient denies having symptoms. Disposition: [] ED /[] Urgent Care (no appt availability in office) / [] Appointment(In office/virtual)/ []  [place] [facility]/ [] Home Care/ [] Refused Recommended Disposition /[] [facility]/ [x]  Follow-up with PCP Additional Notes: Message sent to [facility], NP so she would be aware of the situation.    No appt made since her prior BP readings were within normal limits during her last two OV with [facility].

## 2023-05-26 NOTE — Telephone Encounter (Signed)
Reason for Disposition  Systolic BP  >= 160 OR Diastolic >= 100  Answer Assessment - Initial Assessment Questions 1. BLOOD PRESSURE: "What is the blood pressure?" "Did you take at least two measurements 5 minutes apart?"     Pt in the office physitriatry at the [facility] now.    The CMA, [place] calling in due to elevated BP.     No chest pain or headaches.    214/107 on intake here at the office  206/106 manually.   She did not take her BP medicine this morning because she did not want to take it before coming to this appt due to it making her urinate a lot.   Pt's sister has a BP monitor and can check it but pt does not monitor it herself.    She does not check it at home.     2. ONSET: "When did you take your blood pressure?"     Now in the doctor office. 3. HOW: "How did you take your blood pressure?" (e.g., automatic home BP monitor, visiting nurse)     They took it.   CMA calling in 4. HISTORY: "Do you have a history of high blood pressure?"     Yes 5. MEDICINES: "Are you taking any medicines for blood pressure?" "Have you missed any doses recently?"     No not this morning. 6. OTHER SYMPTOMS: "Do you have any symptoms?" (e.g., blurred vision, chest pain, difficulty breathing, headache, weakness)     No symptoms. 7. PREGNANCY: "Is there any chance you are pregnant?" "When was your last menstrual period?"     N/A due to age  Protocols used: Blood Pressure - High-A-AH

## 2023-06-20 DIAGNOSIS — M545 Low back pain, unspecified: Secondary | ICD-10-CM | POA: Diagnosis not present

## 2023-06-21 NOTE — Patient Instructions (Signed)

## 2023-06-23 ENCOUNTER — Encounter: Payer: Self-pay | Admitting: Nurse Practitioner

## 2023-06-23 ENCOUNTER — Ambulatory Visit (INDEPENDENT_AMBULATORY_CARE_PROVIDER_SITE_OTHER): Payer: Self-pay | Admitting: Nurse Practitioner

## 2023-06-23 VITALS — BP 134/80 | HR 69 | Temp 98.0°F | Ht 60.0 in | Wt 250.8 lb

## 2023-06-23 DIAGNOSIS — E1159 Type 2 diabetes mellitus with other circulatory complications: Secondary | ICD-10-CM | POA: Diagnosis not present

## 2023-06-23 DIAGNOSIS — R809 Proteinuria, unspecified: Secondary | ICD-10-CM

## 2023-06-23 DIAGNOSIS — E1129 Type 2 diabetes mellitus with other diabetic kidney complication: Secondary | ICD-10-CM

## 2023-06-23 DIAGNOSIS — E1140 Type 2 diabetes mellitus with diabetic neuropathy, unspecified: Secondary | ICD-10-CM | POA: Diagnosis not present

## 2023-06-23 DIAGNOSIS — E119 Type 2 diabetes mellitus without complications: Secondary | ICD-10-CM

## 2023-06-23 DIAGNOSIS — Z23 Encounter for immunization: Secondary | ICD-10-CM

## 2023-06-23 DIAGNOSIS — R011 Cardiac murmur, unspecified: Secondary | ICD-10-CM

## 2023-06-23 DIAGNOSIS — I152 Hypertension secondary to endocrine disorders: Secondary | ICD-10-CM

## 2023-06-23 DIAGNOSIS — E1169 Type 2 diabetes mellitus with other specified complication: Secondary | ICD-10-CM

## 2023-06-23 DIAGNOSIS — E785 Hyperlipidemia, unspecified: Secondary | ICD-10-CM

## 2023-06-23 DIAGNOSIS — Z7985 Long-term (current) use of injectable non-insulin antidiabetic drugs: Secondary | ICD-10-CM

## 2023-06-23 NOTE — Assessment & Plan Note (Signed)
 Chronic, ongoing with recent A1c levels below goal, 6.7% today.  Urine ALB 80 February 2025, taking ARB.  Will continue Ozempic as she is about to start 0.5 MG dosing, have educated her on this, and take off Metformin in future if stable levels with Ozempic only. Continue daily B12 supplement for low levels in past. Continue Gabapentin at night.   - Eye and foot exams up to date - Vaccinations up to date - ARB and statin on board

## 2023-06-23 NOTE — Progress Notes (Signed)
 BP 134/80 (BP Location: Left Arm, Patient Position: Sitting, Cuff Size: Large)   Pulse 69   Temp 98 F (36.7 C) (Oral)   Ht 5' (1.524 m)   Wt 250 lb 12.8 oz (113.8 kg)   SpO2 95%   BMI 48.98 kg/m    Subjective:    Patient ID: Jillian Pierce, female    DOB: [birth year], 68 y.o.   MRN: [id number]  HPI: Jillian Pierce is a 68 y.o. female  Chief Complaint  Patient presents with   Diabetes   Hyperlipidemia   Hypertension   DIABETES December A1c 6.7%.  Continues Metformin 250 MG BID + Ozempic weekly -- started 0.5 MG next week.  Is tolerating Ozempic. Takes Gabapentin for ongoing back & hip pain -- is only taking this at night due to fatigue. Hypoglycemic episodes:no Polydipsia/polyuria: no Visual disturbance: no Chest pain: no Paresthesias: no Glucose Monitoring: no             Accucheck frequency: every morning             Fasting glucose: 96 to 128             Post prandial:             Evening:             Before meals: Taking Insulin?: no             Long acting insulin:             Short acting insulin: Blood Pressure Monitoring: not checking Retinal Examination: Up to Date -- [facility] Foot Exam: Up to Date Pneumovax: Up to Date Influenza: Provided today Aspirin: yes    HYPERTENSION / HYPERLIPIDEMIA Continues Lipitor, Olmesartan, Clonidine, and Lasix PRN (not needed recently).  Amlodipine caused leg swelling in the past.  Lisinopril, caused a nagging cough and this was stopped.  Echo on 09/26/21 noted EF 60-65% and moderate LVH with mild aortic valve regurgitation.   Satisfied with current treatment? yes Duration of hypertension: chronic BP monitoring frequency: not checking BP range:  BP medication side effects: no Duration of hyperlipidemia: chronic Cholesterol medication side effects: no Cholesterol supplements: none Medication compliance: good compliance Aspirin: yes Recent stressors: no Recurrent headaches: no Visual changes: no Palpitations:  no Dyspnea: occasionally with walking Chest pain: no Lower extremity edema: improved since stopping Amlodipine Dizzy/lightheaded: no     06/23/2023    9:29 AM 05/16/2023    9:22 AM 03/24/2023    9:41 AM 12/18/2022    9:50 AM 12/03/2022    9:08 AM  Depression screen PHQ 2/9  Decreased Interest 0 0 0 0 0  Down, Depressed, Hopeless 0 0 0 0 0  PHQ - 2 Score 0 0 0 0 0  Altered sleeping 1 0 1 0 1  Tired, decreased energy 1 1 2 1 1   Change in appetite 0 0 0 0 0  Feeling bad or failure about yourself  0 0 0 0 0  Trouble concentrating 0 0 0 0 0  Moving slowly or fidgety/restless 0 0 0 0 0  Suicidal thoughts 0 0 0 0 0  PHQ-9 Score 2 1 3 1 2   Difficult doing work/chores Not difficult at all Not difficult at all Not difficult at all  Not difficult at all       06/23/2023    9:29 AM 05/16/2023    9:22 AM 03/24/2023    9:41 AM 12/18/2022    9:50  AM  GAD 7 : Generalized Anxiety Score  Nervous, Anxious, on Edge 0 0 0 0  Control/stop worrying 0 0 0 0  Worry too much - different things 0 0 0 0  Trouble relaxing 0 0 0 1  Restless 0 0 0 0  Easily annoyed or irritable 0 0 0 0  Afraid - awful might happen 0 0 0 0  Total GAD 7 Score 0 0 0 1  Anxiety Difficulty Not difficult at all Not difficult at all Not difficult at all    Relevant past medical, surgical, family and social history reviewed and updated as indicated. Interim medical history since our last visit reviewed. Allergies and medications reviewed and updated.  Review of Systems  Constitutional:  Negative for activity change, appetite change, diaphoresis, fatigue and fever.  Respiratory:  Positive for shortness of breath (at baseline, no worsening). Negative for cough, chest tightness and wheezing.   Cardiovascular:  Positive for leg swelling (improved). Negative for chest pain and palpitations.  Gastrointestinal: Negative.   Endocrine: Negative for cold intolerance, heat intolerance, polydipsia, polyphagia and polyuria.  Neurological:  Negative.   Psychiatric/Behavioral: Negative.     Per HPI unless specifically indicated above     Objective:    BP 134/80 (BP Location: Left Arm, Patient Position: Sitting, Cuff Size: Large)   Pulse 69   Temp 98 F (36.7 C) (Oral)   Ht 5' (1.524 m)   Wt 250 lb 12.8 oz (113.8 kg)   SpO2 95%   BMI 48.98 kg/m   Wt Readings from Last 3 Encounters:  06/23/23 250 lb 12.8 oz (113.8 kg)  05/16/23 250 lb 6.4 oz (113.6 kg)  05/04/23 253 lb (114.8 kg)    Physical Exam Vitals and nursing note reviewed.  Constitutional:      General: She is awake. She is not in acute distress.    Appearance: She is well-developed and well-groomed. She is obese. She is not ill-appearing or toxic-appearing.  HENT:     Head: Normocephalic.     Right Ear: Hearing and external ear normal.     Left Ear: Hearing and external ear normal.  Eyes:     General: Lids are normal.        Right eye: No discharge.        Left eye: No discharge.     Conjunctiva/sclera: Conjunctivae normal.     Pupils: Pupils are equal, round, and reactive to light.  Neck:     Thyroid: No thyromegaly.     Vascular: No carotid bruit.  Cardiovascular:     Rate and Rhythm: Normal rate and regular rhythm.     Heart sounds: Murmur heard.     Systolic murmur is present with a grade of 2/6.     No gallop.  Pulmonary:     Effort: Pulmonary effort is normal. No accessory muscle usage or respiratory distress.     Breath sounds: Normal breath sounds.  Abdominal:     General: Bowel sounds are normal. There is no distension.     Palpations: Abdomen is soft.     Tenderness: There is no abdominal tenderness.  Musculoskeletal:     Cervical back: Normal range of motion and neck supple.     Right lower leg: No edema.     Left lower leg: No edema.  Lymphadenopathy:     Cervical: No cervical adenopathy.  Skin:    General: Skin is warm and dry.  Neurological:     Mental Status: She  is alert and oriented to person, place, and time.     Deep  Tendon Reflexes: Reflexes are normal and symmetric.     Reflex Scores:      Brachioradialis reflexes are 2+ on the right side and 2+ on the left side.      Patellar reflexes are 2+ on the right side and 2+ on the left side. Psychiatric:        Attention and Perception: Attention normal.        Mood and Affect: Mood normal.        Speech: Speech normal.        Behavior: Behavior normal. Behavior is cooperative.        Thought Content: Thought content normal.    Results for orders placed or performed in visit on 03/24/23  Bayer DCA Hb A1c Waived   Collection Time: 03/24/23  9:51 AM  Result Value Ref Range   HB A1C (BAYER DCA - WAIVED) 6.7 (H) 4.8 - 5.6 %  Comprehensive metabolic panel   Collection Time: 03/24/23  9:52 AM  Result Value Ref Range   Glucose 128 (H) 70 - 99 mg/dL   BUN 16 8 - 27 mg/dL   Creatinine, Ser 1.61 0.57 - 1.00 mg/dL   eGFR 65 >09 UE/AVW/0.98   BUN/Creatinine Ratio 17 12 - 28   Sodium 142 134 - 144 mmol/L   Potassium 4.3 3.5 - 5.2 mmol/L   Chloride 104 96 - 106 mmol/L   CO2 22 20 - 29 mmol/L   Calcium 9.6 8.7 - 10.3 mg/dL   Total Protein 7.1 6.0 - 8.5 g/dL   Albumin 4.3 3.9 - 4.9 g/dL   Globulin, Total 2.8 1.5 - 4.5 g/dL   Bilirubin Total 0.5 0.0 - 1.2 mg/dL   Alkaline Phosphatase 92 44 - 121 IU/L   AST 15 0 - 40 IU/L   ALT 10 0 - 32 IU/L  Lipid Panel w/o Chol/HDL Ratio   Collection Time: 03/24/23  9:52 AM  Result Value Ref Range   Cholesterol, Total 121 100 - 199 mg/dL   Triglycerides 86 0 - 149 mg/dL   HDL 46 >11 mg/dL   VLDL Cholesterol Cal 17 5 - 40 mg/dL   LDL Chol Calc (NIH) 58 0 - 99 mg/dL  CBC with Differential/Platelet   Collection Time: 03/24/23  9:52 AM  Result Value Ref Range   WBC 5.8 3.4 - 10.8 x10E3/uL   RBC 4.29 3.77 - 5.28 x10E6/uL   Hemoglobin 11.3 11.1 - 15.9 g/dL   Hematocrit 91.4 78.2 - 46.6 %   MCV 83 79 - 97 fL   MCH 26.3 (L) 26.6 - 33.0 pg   MCHC 31.7 31.5 - 35.7 g/dL   RDW 95.6 21.3 - 08.6 %   Platelets 434 150 - 450  x10E3/uL   Neutrophils 58 Not Estab. %   Lymphs 29 Not Estab. %   Monocytes 8 Not Estab. %   Eos 4 Not Estab. %   Basos 1 Not Estab. %   Neutrophils Absolute 3.4 1.4 - 7.0 x10E3/uL   Lymphocytes Absolute 1.7 0.7 - 3.1 x10E3/uL   Monocytes Absolute 0.5 0.1 - 0.9 x10E3/uL   EOS (ABSOLUTE) 0.2 0.0 - 0.4 x10E3/uL   Basophils Absolute 0.0 0.0 - 0.2 x10E3/uL   Immature Granulocytes 0 Not Estab. %   Immature Grans (Abs) 0.0 0.0 - 0.1 x10E3/uL  Vitamin B12   Collection Time: 03/24/23  9:52 AM  Result Value Ref Range   Vitamin  B-12 1,021 232 - 1,245 pg/mL  TSH   Collection Time: 03/24/23  9:52 AM  Result Value Ref Range   TSH 1.120 0.450 - 4.500 uIU/mL  VITAMIN D 25 Hydroxy (Vit-D Deficiency, Fractures)   Collection Time: 03/24/23  9:52 AM  Result Value Ref Range   Vit D, 25-Hydroxy 34.4 30.0 - 100.0 ng/mL      Assessment & Plan:   Problem List Items Addressed This Visit       Cardiovascular and Mediastinum   Hypertension associated with diabetes (HCC)   Chronic, stable.  BP improving on recheck today, does continue to have back pain underlying  Will continue ARB (Olmesartan 40 MG daily), was on ACE but having improved cough off of this.  Recommend checking BP at home three mornings a week and documenting for provider + focus on DASH diet.  LABS: urine ALB and CMP.  Could consider changing to Valsartan in future if ongoing elevations in BP or adding Diltiazem.      Relevant Orders   Microalbumin, Urine Waived   Comprehensive metabolic panel     Endocrine   Diabetes mellitus treated with injections of non-insulin medication (HCC)   Refer to diabetes with proteinuria plan of care.      Relevant Orders   Bayer DCA Hb A1c Waived   Diabetes mellitus with proteinuria (HCC) - Primary   Chronic, ongoing with recent A1c levels below goal, 6.7% today.  Urine ALB 80 February 2025, taking ARB.  Will continue Ozempic as she is about to start 0.5 MG dosing, have educated her on this, and  take off Metformin in future if stable levels with Ozempic only. Continue daily B12 supplement for low levels in past. Continue Gabapentin at night.   - Eye and foot exams up to date - Vaccinations up to date - ARB and statin on board      Relevant Orders   Bayer DCA Hb A1c Waived   Microalbumin, Urine Waived   Comprehensive metabolic panel   Hyperlipidemia associated with type 2 diabetes mellitus (HCC)   Chronic, ongoing.  Continue current medication regimen and adjust as needed.  Lipid panel today. Return in 3 months.        Relevant Orders   Comprehensive metabolic panel   Lipid Panel w/o Chol/HDL Ratio   Type 2 diabetes mellitus with diabetic neuropathy, unspecified (HCC)   Chronic, ongoing with recent A1c levels below goal, 6.7% today.  Urine ALB 80 February 2025, taking ARB.  Will continue Ozempic as she is about to start 0.5 MG dosing, have educated her on this, and take off Metformin in future if stable levels with Ozempic only. Continue daily B12 supplement for low levels in past. Continue Gabapentin at night.   - Eye and foot exams up to date - Vaccinations up to date - ARB and statin on board      Relevant Orders   Bayer DCA Hb A1c Waived   Comprehensive metabolic panel     Other   Heart murmur   With aortic sclerosis and moderate LVH past echo.  Asymptomatic at this time.  Consider cardiology referral if any worsening heart function.      Morbid obesity (HCC)   BMI 48.98, with T2DM and HTN/HLD.  Recommended eating smaller high protein, low fat meals more frequently and exercising 30 mins a day 5 times a week with a goal of 10-15lb weight loss in the next 3 months. Patient voiced their understanding and motivation to adhere to  these recommendations.         Follow up plan: Return in about 3 months (around 09/23/2023) for T2DM, HTN/HLD.

## 2023-06-23 NOTE — Assessment & Plan Note (Signed)
With aortic sclerosis and moderate LVH past echo.  Asymptomatic at this time.  Consider cardiology referral if any worsening heart function.

## 2023-06-23 NOTE — Assessment & Plan Note (Signed)
 Chronic, ongoing.  Continue current medication regimen and adjust as needed.  Lipid panel today.  Return in 3 months.

## 2023-06-23 NOTE — Assessment & Plan Note (Signed)
 Refer to diabetes with proteinuria plan of care.

## 2023-06-23 NOTE — Assessment & Plan Note (Signed)
 BMI 48.98, with T2DM and HTN/HLD.  Recommended eating smaller high protein, low fat meals more frequently and exercising 30 mins a day 5 times a week with a goal of 10-15lb weight loss in the next 3 months. Patient voiced their understanding and motivation to adhere to these recommendations.

## 2023-06-23 NOTE — Assessment & Plan Note (Signed)
 Chronic, stable.  BP improving on recheck today, does continue to have back pain underlying  Will continue ARB (Olmesartan 40 MG daily), was on ACE but having improved cough off of this.  Recommend checking BP at home three mornings a week and documenting for provider + focus on DASH diet.  LABS: urine ALB and CMP.  Could consider changing to Valsartan in future if ongoing elevations in BP or adding Diltiazem.

## 2023-06-24 LAB — LIPID PANEL W/O CHOL/HDL RATIO
Cholesterol, Total: 119 mg/dL (ref 100–199)
HDL: 43 mg/dL (ref >39)
LDL Chol Calc (NIH): 63 mg/dL (ref 0–99)
Triglycerides: 59 mg/dL (ref 0–149)
VLDL Cholesterol Cal: 13 mg/dL (ref 5–40)

## 2023-06-24 LAB — COMPREHENSIVE METABOLIC PANEL
ALT: 8 IU/L (ref 0–32)
AST: 14 IU/L (ref 0–40)
Albumin: 4.1 g/dL (ref 3.9–4.9)
Alkaline Phosphatase: 94 IU/L (ref 44–121)
BUN/Creatinine Ratio: 12 (ref 12–28)
BUN: 11 mg/dL (ref 8–27)
Bilirubin Total: 0.5 mg/dL (ref 0.0–1.2)
CO2: 23 mmol/L (ref 20–29)
Calcium: 9.5 mg/dL (ref 8.7–10.3)
Chloride: 103 mmol/L (ref 96–106)
Creatinine, Ser: 0.94 mg/dL (ref 0.57–1.00)
Globulin, Total: 2.7 g/dL (ref 1.5–4.5)
Glucose: 127 mg/dL — ABNORMAL HIGH (ref 70–99)
Potassium: 4.1 mmol/L (ref 3.5–5.2)
Sodium: 141 mmol/L (ref 134–144)
Total Protein: 6.8 g/dL (ref 6.0–8.5)
eGFR: 66 mL/min/{1.73_m2} (ref >59)

## 2023-06-24 LAB — MICROALBUMIN, URINE WAIVED
Creatinine, Urine Waived: 100 mg/dL (ref 10–300)
Microalb, Ur Waived: 80 mg/L — ABNORMAL HIGH (ref 0–19)

## 2023-06-24 LAB — BAYER DCA HB A1C WAIVED: HB A1C (BAYER DCA - WAIVED): 6.7 % — ABNORMAL HIGH (ref 4.8–5.6)

## 2023-06-24 NOTE — Progress Notes (Signed)
 Reviewed results with patient per provider direction. No questions, comments or concerns.

## 2023-06-24 NOTE — Addendum Note (Signed)
 Addended by: Aura Dials T on: 06/24/2023 11:49 AM   Modules accepted: Level of Service

## 2023-06-24 NOTE — Progress Notes (Signed)
 Good morning Jillian Pierce, your labs have returned: - Kidney function, creatinine and eGFR, remains normal, as is liver function, AST and ALT.  - Lipid panel continues to show stable levels.  No changes to medications needed.  Any questions? Keep being stellar!!  Thank you for allowing me to participate in your care.  I appreciate you. Kindest regards, Nalina Yeatman

## 2023-06-26 ENCOUNTER — Other Ambulatory Visit: Payer: Self-pay | Admitting: Nurse Practitioner

## 2023-06-26 DIAGNOSIS — Z1231 Encounter for screening mammogram for malignant neoplasm of breast: Secondary | ICD-10-CM

## 2023-07-02 ENCOUNTER — Other Ambulatory Visit: Payer: Self-pay | Admitting: Nurse Practitioner

## 2023-07-08 ENCOUNTER — Ambulatory Visit (INDEPENDENT_AMBULATORY_CARE_PROVIDER_SITE_OTHER): Payer: Medicare PPO | Admitting: Emergency Medicine

## 2023-07-08 VITALS — Ht 64.0 in | Wt 240.0 lb

## 2023-07-08 DIAGNOSIS — Z0000 Encounter for general adult medical examination without abnormal findings: Secondary | ICD-10-CM | POA: Diagnosis not present

## 2023-07-08 NOTE — Patient Instructions (Addendum)
 Ms. Jasso , Thank you for taking time to come for your Medicare Wellness Visit. I appreciate your ongoing commitment to your health goals. Please review the following plan we discussed and let me know if I can assist you in the future.   Referrals/Orders/Follow-Ups/Clinician Recommendations: Get the shingles vaccines at your earliest convenience. You may get these at your local pharmacy.  This is a list of the screening recommended for you and due dates:  Health Maintenance  Topic Date Due   COVID-19 Vaccine (4 - 2024-25 season) 12/22/2022   Pneumonia Vaccine (3 of 3 - PPSV23 or PCV20) 01/14/2023   Zoster (Shingles) Vaccine (1 of 2) 09/23/2023*   Eye exam for diabetics  11/06/2023   Hemoglobin A1C  12/24/2023   Complete foot exam   03/23/2024   Yearly kidney function blood test for diabetes  06/22/2024   Yearly kidney health urinalysis for diabetes  06/22/2024   Medicare Annual Wellness Visit  07/07/2024   Mammogram  07/10/2024   Colon Cancer Screening  11/02/2025   DEXA scan (bone density measurement)  01/03/2026   DTaP/Tdap/Td vaccine (2 - Td or Tdap) 10/12/2030   Flu Shot  Completed   Hepatitis C Screening  Completed   HPV Vaccine  Aged Out  *Topic was postponed. The date shown is not the original due date.    Advanced directives: (Provided) Advance directive discussed with you today. I have provided a copy for you to complete at home and have notarized. Once this is complete, please bring a copy in to our office so we can scan it into your chart.   Next Medicare Annual Wellness Visit scheduled for next year: Yes, 3/17//26 @ 1:50pm (phone visit)

## 2023-07-08 NOTE — Progress Notes (Signed)
 Subjective:   Jillian Pierce is a 68 y.o. who presents for a Medicare Wellness preventive visit.  Visit Complete: Virtual I connected with  Milinda Antis on 07/08/23 by a audio enabled telemedicine application and verified that I am speaking with the correct person using two identifiers.  Patient Location: Home  Provider Location: Office/Clinic  I discussed the limitations of evaluation and management by telemedicine. The patient expressed understanding and agreed to proceed.  Vital Signs: Because this visit was a virtual/telehealth visit, some criteria may be missing or patient reported. Any vitals not documented were not able to be obtained and vitals that have been documented are patient reported.  VideoDeclined- This patient declined Librarian, academic. Therefore the visit was completed with audio only.  Persons Participating in Visit: Patient.  AWV Questionnaire: No: Patient Medicare AWV questionnaire was not completed prior to this visit.  Cardiac Risk Factors include: advanced age (>56men, >41 women);hypertension;dyslipidemia;diabetes mellitus;obesity (BMI >30kg/m2)     Objective:    Today's Vitals   07/08/23 0919  Weight: 240 lb (108.9 kg)  Height: 5\' 4"  (1.626 m)   Body mass index is 41.2 kg/m.     07/08/2023    9:28 AM 05/04/2023    3:39 PM 07/02/2022   11:07 AM 05/05/2022    2:22 PM 06/28/2021   11:35 AM 06/26/2020   11:16 AM 06/17/2019   10:56 AM  Advanced Directives  Does Patient Have a Medical Advance Directive? No No No No No No No  Would patient like information on creating a medical advance directive? Yes (MAU/Ambulatory/Procedural Areas - Information given) No - Patient declined No - Patient declined  No - Patient declined      Current Medications (verified) Outpatient Encounter Medications as of 07/08/2023  Medication Sig   albuterol (VENTOLIN HFA) 108 (90 Base) MCG/ACT inhaler Inhale 2 puffs into the lungs every 6 (six) hours  as needed for wheezing or shortness of breath.   amLODipine (NORVASC) 10 MG tablet TAKE 1 TABLET EVERY DAY   Ascorbic Acid (VITAMIN C PO) Take by mouth daily.   aspirin EC 81 MG tablet Take 81 mg by mouth daily.   atorvastatin (LIPITOR) 10 MG tablet Take 1 tablet (10 mg total) by mouth daily.   Blood Glucose Monitoring Suppl (TRUE METRIX AIR GLUCOSE METER) w/Device KIT Use to check blood sugar 1-3 times a day.   cloNIDine (CATAPRES) 0.3 MG tablet Take 1 tablet (0.3 mg total) by mouth 2 (two) times daily.   Cyanocobalamin (VITAMIN B 12 PO) Take by mouth daily.   fluticasone (FLONASE) 50 MCG/ACT nasal spray Place 2 sprays into both nostrils daily.   furosemide (LASIX) 20 MG tablet Take 1 tablet (20 mg total) by mouth daily.   gabapentin (NEURONTIN) 600 MG tablet Take 300 MG (1/2 tablet) by mouth in morning and then 600 MG (whole tablet) by mouth at night.   glucose blood (TRUE METRIX BLOOD GLUCOSE TEST) test strip CHECK BLOOD SUGAR 1 TO 2 TIMES DAILY   meloxicam (MOBIC) 15 MG tablet Take 1 tablet (15 mg total) by mouth daily as needed for pain. As needed   metFORMIN (GLUCOPHAGE) 500 MG tablet TAKE ONE-HALF TABLET BY MOUTH TWICE DAILY   olmesartan (BENICAR) 40 MG tablet Take 1 tablet (40 mg total) by mouth daily.   Semaglutide,0.25 or 0.5MG /DOS, 2 MG/3ML SOPN Start out with 0.25 MG injected in the skin once a week for 4 weeks and then on week 5 increase to 0.5  MG injected into the skin weekly.   tiZANidine (ZANAFLEX) 4 MG tablet Take 1 tablet (4 mg total) by mouth every 6 (six) hours as needed for muscle spasms.   TRUEplus Lancets 33G MISC Use to check blood sugar 1-3 times daily.   VITAMIN D PO Take by mouth daily.   No facility-administered encounter medications on file as of 07/08/2023.    Allergies (verified) Patient has no known allergies.   History: Past Medical History:  Diagnosis Date   Diabetes mellitus without complication (HCC)    Hypertension    Left knee pain 05/17/2014    Morbid obesity (HCC) 02/15/2016   Past Surgical History:  Procedure Laterality Date   ABDOMINAL HYSTERECTOMY     complete   ACHILLES TENDON SURGERY Left    COLONOSCOPY WITH PROPOFOL N/A 11/03/2018   Procedure: COLONOSCOPY WITH PROPOFOL;  Surgeon: Toney Reil, MD;  Location: [facility];  Service: Gastroenterology;  Laterality: N/A;   HERNIA REPAIR  11/2014   INSERTION OF MESH N/A 11/23/2014   Procedure: INSERTION OF MESH;  Surgeon: Natale Lay, MD;  Location: [facility] ORS;  Service: General;  Laterality: N/A;   JOINT REPLACEMENT Left 12/18/2018   VENTRAL HERNIA REPAIR N/A 11/23/2014   Procedure: HERNIA REPAIR VENTRAL ADULT;  Surgeon: Natale Lay, MD;  Location: [facility] ORS;  Service: General;  Laterality: N/A;   Family History  Problem Relation Age of Onset   Hypertension Mother    Diabetes Mother    Kidney disease Mother    Hypertension Father    Diabetes Father    Heart failure Father    Heart disease Sister    Heart disease Brother    Hypertension Son    Breast cancer Maternal Aunt    Social History   Socioeconomic History   Marital status: Single    Spouse name: Not on file   Number of children: 1   Years of education: Not on file   Highest education level: Not on file  Occupational History   Occupation: disability  Tobacco Use   Smoking status: Never   Smokeless tobacco: Never  Vaping Use   Vaping status: Never Used  Substance and Sexual Activity   Alcohol use: No   Drug use: No   Sexual activity: Not Currently  Other Topics Concern   Not on file  Social History Narrative   Not on file   Social Drivers of Health   Financial Resource Strain: Low Risk  (07/08/2023)   Overall Financial Resource Strain (CARDIA)    Difficulty of Paying Living Expenses: Not hard at all  Food Insecurity: No Food Insecurity (07/08/2023)   Hunger Vital Sign    Worried About Running Out of Food in the Last Year: Never true    Ran Out of Food in the Last Year: Never true   Transportation Needs: No Transportation Needs (07/08/2023)   PRAPARE - Administrator, Civil Service (Medical): No    Lack of Transportation (Non-Medical): No  Physical Activity: Inactive (07/08/2023)   Exercise Vital Sign    Days of Exercise per Week: 0 days    Minutes of Exercise per Session: 0 min  Stress: No Stress Concern Present (07/08/2023)   [employer] of Occupational Health - Occupational Stress Questionnaire    Feeling of Stress : Not at all  Social Connections: Socially Isolated (07/08/2023)   Social Connection and Isolation Panel [NHANES]    Frequency of Communication with Friends and Family: More than three times a week  Frequency of Social Gatherings with Friends and Family: More than three times a week    Attends Religious Services: Never    Database administrator or Organizations: No    Attends Engineer, structural: Never    Marital Status: Never married    Tobacco Counseling Counseling given: Not Answered    Clinical Intake:  Pre-visit preparation completed: Yes  Pain : No/denies pain     BMI - recorded: 41.2 Nutritional Status: BMI > 30  Obese Nutritional Risks: None Diabetes: Yes CBG done?: No (FBS 126 per patient) Did pt. bring in CBG monitor from home?: No  How often do you need to have someone help you when you read instructions, pamphlets, or other written materials from your doctor or pharmacy?: 1 - Never  Interpreter Needed?: No  Information entered by :: Tora Kindred, CMA   Activities of Daily Living     07/08/2023    9:22 AM  In your present state of health, do you have any difficulty performing the following activities:  Hearing? 0  Vision? 0  Difficulty concentrating or making decisions? 0  Walking or climbing stairs? 1  Comment cane  Dressing or bathing? 0  Doing errands, shopping? 1  Comment doesn't drive, sister takes to appointments  Preparing Food and eating ? N  Using the Toilet? N  In the past  six months, have you accidently leaked urine? N  Do you have problems with loss of bowel control? N  Managing your Medications? N  Managing your Finances? N  Housekeeping or managing your Housekeeping? N    Patient Care Team: Marjie Skiff, NP as PCP - General (Nurse Practitioner) Estanislado Pandy, MD as Consulting Physician (Ophthalmology)  Indicate any recent Medical Services you may have received from other than [facility] providers in the past year (date may be approximate).     Assessment:   This is a routine wellness examination for Anokhi.  Hearing/Vision screen Hearing Screening - Comments:: Denies hearing loss Vision Screening - Comments:: Gets DM eye exams Dr. Deberah Pelton [facility]   Goals Addressed               This Visit's Progress     Patient Stated (pt-stated)        Lose 50 lbs       Depression Screen     07/08/2023    9:26 AM 06/23/2023    9:29 AM 05/16/2023    9:22 AM 03/24/2023    9:41 AM 12/18/2022    9:50 AM 12/03/2022    9:08 AM 11/01/2022    9:27 AM  PHQ 2/9 Scores  PHQ - 2 Score 0 0 0 0 0 0 0  PHQ- 9 Score 0 2 1 3 1 2 4     Fall Risk     07/08/2023    9:31 AM 06/23/2023    9:29 AM 05/16/2023    9:22 AM 03/24/2023    9:41 AM 12/18/2022    9:49 AM  Fall Risk   Falls in the past year? 0 0 0 0 0  Number falls in past yr: 0 0 0 0 0  Injury with Fall? 0 0 0 0 0  Risk for fall due to : No Fall Risks No Fall Risks No Fall Risks No Fall Risks No Fall Risks  Follow up Falls prevention discussed;Falls evaluation completed Falls evaluation completed Falls evaluation completed Falls evaluation completed Falls evaluation completed    MEDICARE RISK AT HOME:  Medicare Risk at Home Any stairs in or around the home?: Yes If so, are there any without handrails?: No Home free of loose throw rugs in walkways, pet beds, electrical cords, etc?: Yes Adequate lighting in your home to reduce risk of falls?: Yes Life alert?: No Use of a cane, walker or  w/c?: Yes (cane) Grab bars in the bathroom?: Yes Shower chair or bench in shower?: No Elevated toilet seat or a handicapped toilet?: No  TIMED UP AND GO:  Was the test performed?  No  Cognitive Function: 6CIT completed        07/08/2023    9:31 AM 07/02/2022   11:13 AM 06/26/2020   11:18 AM  6CIT Screen  What Year? 0 points 0 points 0 points  What month? 0 points 0 points 0 points  What time? 0 points 0 points 0 points  Count back from 20 0 points 0 points 0 points  Months in reverse 0 points 0 points 4 points  Repeat phrase 4 points 8 points 10 points  Total Score 4 points 8 points 14 points    Immunizations Immunization History  Administered Date(s) Administered   Fluad Quad(high Dose 65+) 12/28/2019, 02/15/2021   Fluad Trivalent(High Dose 65+) 03/24/2023   Influenza Inj Mdck Quad Pf 02/15/2016   Influenza,inj,Quad PF,6+ Mos 01/13/2018, 02/12/2019   Influenza-Unspecified 02/25/2013, 02/21/2015   PFIZER(Purple Top)SARS-COV-2 Vaccination 07/13/2019, 08/03/2019, 06/14/2020   Pneumococcal Conjugate-13 12/28/2019   Pneumococcal Polysaccharide-23 01/13/2018   Tdap 10/11/2020    Screening Tests Health Maintenance  Topic Date Due   COVID-19 Vaccine (4 - 2024-25 season) 12/22/2022   Pneumonia Vaccine 66+ Years old (3 of 3 - PPSV23 or PCV20) 01/14/2023   Zoster Vaccines- Shingrix (1 of 2) 09/23/2023 (Originally 05/23/2004)   OPHTHALMOLOGY EXAM  11/06/2023   HEMOGLOBIN A1C  12/24/2023   FOOT EXAM  03/23/2024   Diabetic kidney evaluation - eGFR measurement  06/22/2024   Diabetic kidney evaluation - Urine ACR  06/22/2024   Medicare Annual Wellness (AWV)  07/07/2024   MAMMOGRAM  07/10/2024   Colonoscopy  11/02/2025   DEXA SCAN  01/03/2026   DTaP/Tdap/Td (2 - Td or Tdap) 10/12/2030   INFLUENZA VACCINE  Completed   Hepatitis C Screening  Completed   HPV VACCINES  Aged Out    Health Maintenance  Health Maintenance Due  Topic Date Due   COVID-19 Vaccine (4 - 2024-25  season) 12/22/2022   Pneumonia Vaccine 74+ Years old (3 of 3 - PPSV23 or PCV20) 01/14/2023   Health Maintenance Items Addressed: See Nurse Notes  Additional Screening:  Vision Screening: Recommended annual ophthalmology exams for early detection of glaucoma and other disorders of the eye.  Dental Screening: Recommended annual dental exams for proper oral hygiene  Community Resource Referral / Chronic Care Management: CRR required this visit?  No   CCM required this visit?  No     Plan:     I have personally reviewed and noted the following in the patient's chart:   Medical and social history Use of alcohol, tobacco or illicit drugs  Current medications and supplements including opioid prescriptions. Patient is not currently taking opioid prescriptions. Functional ability and status Nutritional status Physical activity Advanced directives List of other physicians Hospitalizations, surgeries, and ER visits in previous 12 months Vitals Screenings to include cognitive, depression, and falls Referrals and appointments  In addition, I have reviewed and discussed with patient certain preventive protocols, quality metrics, and best practice recommendations. A written personalized care plan  for preventive services as well as general preventive health recommendations were provided to patient.     Tora Kindred, CMA   07/08/2023   After Visit Summary: (Mail) Due to this being a telephonic visit, the after visit summary with patients personalized plan was offered to patient via mail   Notes:  6 CIT Score - 4 FBS 126 this morning per patient Declined DM & Nutrition Education Declined Covid vaccines Needs pneumonia and shingles vaccines

## 2023-07-14 ENCOUNTER — Ambulatory Visit
Admission: RE | Admit: 2023-07-14 | Discharge: 2023-07-14 | Disposition: A | Discharge status: Home / Self Care | Source: Ambulatory Visit | Attending: Nurse Practitioner | Admitting: Nurse Practitioner

## 2023-07-14 DIAGNOSIS — Z1231 Encounter for screening mammogram for malignant neoplasm of breast: Secondary | ICD-10-CM | POA: Diagnosis present

## 2023-08-16 ENCOUNTER — Other Ambulatory Visit: Payer: Self-pay | Admitting: Nurse Practitioner

## 2023-08-18 NOTE — Telephone Encounter (Signed)
 Rx 03/24/23 #90 1RF- too soon Requested Prescriptions  Pending Prescriptions Disp Refills   olmesartan  (BENICAR ) 40 MG tablet [Pharmacy Med Name: Olmesartan  Medoxomil Oral Tablet 40 MG] 90 tablet 3    Sig: TAKE 1 TABLET EVERY DAY     Cardiovascular:  Angiotensin Receptor Blockers Passed - 08/18/2023 11:03 AM      Passed - Cr in normal range and within 180 days    Creatinine  Date Value Ref Range Status  06/03/2013 0.77 0.60 - 1.30 mg/dL Final   Creatinine, Ser  Date Value Ref Range Status  06/23/2023 0.94 0.57 - 1.00 mg/dL Final         Passed - K in normal range and within 180 days    Potassium  Date Value Ref Range Status  06/23/2023 4.1 3.5 - 5.2 mmol/L Final  06/03/2013 3.1 (L) 3.5 - 5.1 mmol/L Final         Passed - Patient is not pregnant      Passed - Last BP in normal range    BP Readings from Last 1 Encounters:  06/23/23 134/80         Passed - Valid encounter within last 6 months    Recent Outpatient Visits           1 month ago Diabetes mellitus with proteinuria (HCC)   [facility] Symonds, Lavelle Posey, NP       Future Appointments             In 1 month Cannady, Lavelle Posey, NP [place] [facility]

## 2023-09-21 NOTE — Patient Instructions (Incomplete)
Be Involved in Caring For Your Health:  Taking Medications When medications are taken as directed, they can greatly improve your health. But if they are not taken as prescribed, they may not work. In some cases, not taking them correctly can be harmful. To help ensure your treatment remains effective and safe, understand your medications and how to take them. Bring your medications to each visit for review by your provider.  Your lab results, notes, and after visit summary will be available on My Chart. We strongly encourage you to use this feature. If lab results are abnormal the clinic will contact you with the appropriate steps. If the clinic does not contact you assume the results are satisfactory. You can always view your results on My Chart. If you have questions regarding your health or results, please contact the clinic during office hours. You can also ask questions on My Chart.  We at [facility] are grateful that you chose [place] to provide your care. We strive to provide evidence-based and compassionate care and are always looking for feedback. If you get a survey from the clinic please complete this so we can hear your opinions.  Diabetes Mellitus and Exercise Regular exercise is important for your health, especially if you have diabetes mellitus. Exercise is not just about losing weight. It can also help you increase muscle strength and bone density and reduce body fat and stress. This can help your level of endurance and make you more fit and flexible. Why should I exercise if I have diabetes? Exercise has many benefits for people with diabetes. It can: Help lower and control your blood sugar (glucose). Help your body respond better and become more sensitive to the hormone insulin. Reduce how much insulin your body needs. Lower your risk for heart disease by: Lowering how much "bad" cholesterol and triglycerides you have in your body. Increasing how much "good" cholesterol  you have in your body. Lowering your blood pressure. Lowering your blood glucose levels. What is my activity plan? Your health care provider or an expert trained in diabetes care (certified diabetes educator) can help you make an activity plan. This plan can help you find the type of exercise that works for you. It may also tell you how often to exercise and for how long. Be sure to: Get at least 150 minutes of medium-intensity or high-intensity exercise each week. This may involve brisk walking, biking, or water aerobics. Do stretching and strengthening exercises at least 2 times a week. This may involve yoga or weight lifting. Spread out your activity over at least 3 days of the week. Get some form of physical activity each day. Do not go more than 2 days in a row without some kind of activity. Avoid being inactive for more than 30 minutes at a time. Take frequent breaks to walk or stretch. Choose activities that you enjoy. Set goals that you know you can accomplish. Start slowly and increase the intensity of your exercise over time. How do I manage my diabetes during exercise?  Monitor your blood glucose Check your blood glucose before and after you exercise. If your blood glucose is 240 mg/dL (40.9 mmol/L) or higher before you exercise, check your urine for ketones. These are chemicals created by the liver. If you have ketones in your urine, do not exercise until your blood glucose returns to normal. If your blood glucose is 100 mg/dL (5.6 mmol/L) or lower, eat a snack that has 15-20 grams of carbohydrate in  it. Check your blood glucose 15 minutes after the snack to make sure that your level is above 100 mg/dL (5.6 mmol/L) before you start to exercise. Your risk for low blood glucose (hypoglycemia) goes up during and after exercise. Know the symptoms of this condition and how to treat it. Follow these instructions at home: Keep a carbohydrate snack on hand for use before, during, and after  exercise. This can help prevent or treat hypoglycemia. Avoid injecting insulin into parts of your body that are going to be used during exercise. This may include: Your arms, when you are going to play tennis. Your legs, when you are about to go jogging. Keep track of your exercise habits. This can help you and your health care provider watch and adjust your activity plan. Write down: What you eat before and after you exercise. Blood glucose levels before and after you exercise. The type and amount of exercise you do. Talk to your health care provider before you start a new activity. They may need to: Make sure that the activity is safe for you. Adjust your insulin, other medicines, and food that you eat. Drink water while you exercise. This can stop you from losing too much water (dehydration). It can also prevent problems caused by having a lot of heat in your body (heat stroke). Where to find more information American Diabetes Association: diabetes.org Association of Diabetes Care & Education Specialists: diabeteseducator.org This information is not intended to replace advice given to you by your health care provider. Make sure you discuss any questions you have with your health care provider. Document Revised: 09/26/2021 Document Reviewed: 09/26/2021 Elsevier Patient Education  2024 [employer].

## 2023-09-26 ENCOUNTER — Ambulatory Visit (INDEPENDENT_AMBULATORY_CARE_PROVIDER_SITE_OTHER): Admitting: Nurse Practitioner

## 2023-09-26 ENCOUNTER — Ambulatory Visit: Payer: Self-pay | Admitting: Nurse Practitioner

## 2023-09-26 ENCOUNTER — Encounter: Payer: Self-pay | Admitting: Nurse Practitioner

## 2023-09-26 ENCOUNTER — Ambulatory Visit
Admission: RE | Admit: 2023-09-26 | Discharge: 2023-09-26 | Disposition: A | Discharge status: Home / Self Care | Source: Ambulatory Visit | Attending: Nurse Practitioner | Admitting: Nurse Practitioner

## 2023-09-26 VITALS — BP 136/74 | HR 67 | Temp 98.4°F | Ht 60.2 in | Wt 253.4 lb

## 2023-09-26 DIAGNOSIS — E1159 Type 2 diabetes mellitus with other circulatory complications: Secondary | ICD-10-CM

## 2023-09-26 DIAGNOSIS — Z7985 Long-term (current) use of injectable non-insulin antidiabetic drugs: Secondary | ICD-10-CM

## 2023-09-26 DIAGNOSIS — E1169 Type 2 diabetes mellitus with other specified complication: Secondary | ICD-10-CM

## 2023-09-26 DIAGNOSIS — M79604 Pain in right leg: Secondary | ICD-10-CM | POA: Insufficient documentation

## 2023-09-26 DIAGNOSIS — E785 Hyperlipidemia, unspecified: Secondary | ICD-10-CM

## 2023-09-26 DIAGNOSIS — E1129 Type 2 diabetes mellitus with other diabetic kidney complication: Secondary | ICD-10-CM

## 2023-09-26 DIAGNOSIS — M79605 Pain in left leg: Secondary | ICD-10-CM | POA: Insufficient documentation

## 2023-09-26 DIAGNOSIS — E119 Type 2 diabetes mellitus without complications: Secondary | ICD-10-CM

## 2023-09-26 DIAGNOSIS — R011 Cardiac murmur, unspecified: Secondary | ICD-10-CM

## 2023-09-26 DIAGNOSIS — E1140 Type 2 diabetes mellitus with diabetic neuropathy, unspecified: Secondary | ICD-10-CM | POA: Diagnosis not present

## 2023-09-26 DIAGNOSIS — R809 Proteinuria, unspecified: Secondary | ICD-10-CM

## 2023-09-26 DIAGNOSIS — I152 Hypertension secondary to endocrine disorders: Secondary | ICD-10-CM

## 2023-09-26 LAB — BAYER DCA HB A1C WAIVED: HB A1C (BAYER DCA - WAIVED): 6.4 % — ABNORMAL HIGH (ref 4.8–5.6)

## 2023-09-26 MED ORDER — PREGABALIN 75 MG PO CAPS
75.0000 mg | ORAL_CAPSULE | Freq: Every day | ORAL | 4 refills | Status: DC
Start: 2023-09-26 — End: 2023-10-30

## 2023-09-26 MED ORDER — TRUE METRIX AIR GLUCOSE METER W/DEVICE KIT: PACK | 1 refills | Status: AC

## 2023-09-26 NOTE — Assessment & Plan Note (Signed)
 BMI 49.16, with T2DM and HTN/HLD.  Recommended eating smaller high protein, low fat meals more frequently and exercising 30 mins a day 5 times a week with a goal of 10-15lb weight loss in the next 3 months. Patient voiced their understanding and motivation to adhere to these recommendations.

## 2023-09-26 NOTE — Assessment & Plan Note (Signed)
 Refer to diabetes with proteinuria plan of care.

## 2023-09-26 NOTE — Assessment & Plan Note (Addendum)
 Chronic, ongoing.  Continue current medication regimen and adjust as needed. Lipid panel today.

## 2023-09-26 NOTE — Progress Notes (Signed)
 BP 136/74 (BP Location: Left Arm, Patient Position: Sitting, Cuff Size: Large)   Pulse 67   Temp 98.4 F (36.9 C) (Oral)   Ht 5' 0.2" (1.529 m)   Wt 253 lb 6.4 oz (114.9 kg)   SpO2 97%   BMI 49.16 kg/m    Subjective:    Patient ID: Jillian Pierce, female    DOB: 09-Aug-1955, 68 y.o.   MRN: [id number]  HPI: Jillian Pierce is a 68 y.o. female  Chief Complaint  Patient presents with   Diabetes   Hyperlipidemia   Hypertension   DIABETES March 6.7% A1c.  Taking Metformin  250 MG BID + Ozempic  weekly 0.5 MG.  Is tolerating Ozempic .   Hypoglycemic episodes:no Polydipsia/polyuria: no Visual disturbance: no Chest pain: no Paresthesias: no Glucose Monitoring: no             Accucheck frequency: every morning             Fasting glucose: 96 to 128             Post prandial:             Evening:             Before meals: Taking Insulin?: no             Long acting insulin:             Short acting insulin: Blood Pressure Monitoring: not checking Retinal Examination: Up to Date -- [facility] Foot Exam: Up to Date Pneumovax: Up to Date Influenza: Provided today Aspirin : yes   NEUROPATHY Gabapentin  taken for ongoing back & hip pain, takes on tablet at night only and none during daytime due to fatigue. Started having worsening leg pain bilaterally one month ago, L>R.  Is having tingling and toes are numb. The pain is an aching and throbbing to the whole leg. Does have edema, can not wear compression at this time due to discomfort.  Gabapentin  not offering benefit.  Does have bilateral hip pain too and has know degenerative changes lumbar spine. Neuropathy status: uncontrolled  Satisfied with current treatment?: yes Medication side effects: no Medication compliance:  good compliance Location: as above Pain: yes Severity: 9/10  Quality:  as above Frequency: constant Bilateral: yes Symmetric: yes Numbness: yes Decreased sensation: yes Weakness: no Context:  worse Alleviating factors: Tylenol  Aggravating factors: nothing Treatments attempted: Tylenol  and Gabapentin    HYPERTENSION / HYPERLIPIDEMIA Taking Lipitor, Olmesartan , Clonidine , and Lasix .  Amlodipine  caused leg swelling in the past and currently is noting swelling.  Lisinopril , caused a nagging cough and this was stopped.  Echo on 09/26/21 noted EF 60-65% and moderate LVH with mild aortic valve regurgitation.   Satisfied with current treatment? yes Duration of hypertension: chronic BP monitoring frequency: not checking BP range:  BP medication side effects: no Duration of hyperlipidemia: chronic Cholesterol medication side effects: no Cholesterol supplements: none Medication compliance: good compliance Aspirin : yes Recent stressors: no Recurrent headaches: no Visual changes: no Palpitations: no Dyspnea: a little with activity Chest pain: no Lower extremity edema:as above Dizzy/lightheaded: no     09/26/2023    8:53 AM 07/08/2023    9:26 AM 06/23/2023    9:29 AM 05/16/2023    9:22 AM 03/24/2023    9:41 AM  Depression screen PHQ 2/9  Decreased Interest 0 0 0 0 0  Down, Depressed, Hopeless 0 0 0 0 0  PHQ - 2 Score 0 0 0 0 0  Altered sleeping  1 0 1 0 1  Tired, decreased energy 1 0 1 1 2   Change in appetite 0 0 0 0 0  Feeling bad or failure about yourself  0 0 0 0 0  Trouble concentrating 0 0 0 0 0  Moving slowly or fidgety/restless 0 0 0 0 0  Suicidal thoughts 0 0 0 0 0  PHQ-9 Score 2 0 2 1 3   Difficult doing work/chores Not difficult at all Not difficult at all Not difficult at all Not difficult at all Not difficult at all       09/26/2023    8:54 AM 06/23/2023    9:29 AM 05/16/2023    9:22 AM 03/24/2023    9:41 AM  GAD 7 : Generalized Anxiety Score  Nervous, Anxious, on Edge 0 0 0 0  Control/stop worrying 0 0 0 0  Worry too much - different things 0 0 0 0  Trouble relaxing 1 0 0 0  Restless 0 0 0 0  Easily annoyed or irritable 0 0 0 0  Afraid - awful might happen 0 0 0 0   Total GAD 7 Score 1 0 0 0  Anxiety Difficulty Not difficult at all Not difficult at all Not difficult at all Not difficult at all   Relevant past medical, surgical, family and social history reviewed and updated as indicated. Interim medical history since our last visit reviewed. Allergies and medications reviewed and updated.  Review of Systems  Constitutional:  Negative for activity change, appetite change, diaphoresis, fatigue and fever.  Respiratory:  Positive for shortness of breath (at baseline, no worsening). Negative for cough, chest tightness and wheezing.   Cardiovascular:  Positive for leg swelling (at baseline). Negative for chest pain and palpitations.  Gastrointestinal: Negative.   Endocrine: Negative for cold intolerance, heat intolerance, polydipsia, polyphagia and polyuria.  Musculoskeletal:  Positive for arthralgias.  Neurological: Negative.   Psychiatric/Behavioral: Negative.     Per HPI unless specifically indicated above     Objective:    BP 136/74 (BP Location: Left Arm, Patient Position: Sitting, Cuff Size: Large)   Pulse 67   Temp 98.4 F (36.9 C) (Oral)   Ht 5' 0.2" (1.529 m)   Wt 253 lb 6.4 oz (114.9 kg)   SpO2 97%   BMI 49.16 kg/m   Wt Readings from Last 3 Encounters:  09/26/23 253 lb 6.4 oz (114.9 kg)  07/08/23 240 lb (108.9 kg)  06/23/23 250 lb 12.8 oz (113.8 kg)    Physical Exam Vitals and nursing note reviewed.  Constitutional:      General: She is awake. She is not in acute distress.    Appearance: She is well-developed and well-groomed. She is obese. She is not ill-appearing or toxic-appearing.  HENT:     Head: Normocephalic.     Right Ear: Hearing and external ear normal.     Left Ear: Hearing and external ear normal.  Eyes:     General: Lids are normal.        Right eye: No discharge.        Left eye: No discharge.     Conjunctiva/sclera: Conjunctivae normal.     Pupils: Pupils are equal, round, and reactive to light.  Neck:      Thyroid : No thyromegaly.     Vascular: No carotid bruit.  Cardiovascular:     Rate and Rhythm: Normal rate and regular rhythm.     Pulses:          Dorsalis  pedis pulses are 2+ on the right side and 2+ on the left side.       Posterior tibial pulses are 2+ on the right side and 2+ on the left side.     Heart sounds: Murmur heard.     Systolic murmur is present with a grade of 2/6.     No gallop.     Comments: Pain with pushing up on feet both sides. Pulmonary:     Effort: Pulmonary effort is normal. No accessory muscle usage or respiratory distress.     Breath sounds: Normal breath sounds.  Abdominal:     General: Bowel sounds are normal. There is no distension.     Palpations: Abdomen is soft.     Tenderness: There is no abdominal tenderness.  Musculoskeletal:     Cervical back: Normal range of motion and neck supple.     Right lower leg: 1+ Pitting Edema present.     Left lower leg: 1+ Pitting Edema present.     Right foot: Normal range of motion.     Left foot: Normal range of motion.  Feet:     Right foot:     Protective Sensation: 10 sites tested.  10 sites sensed.     Skin integrity: Skin integrity normal.     Left foot:     Protective Sensation: 10 sites tested.  10 sites sensed.     Skin integrity: Skin integrity normal.  Lymphadenopathy:     Cervical: No cervical adenopathy.  Skin:    General: Skin is warm and dry.  Neurological:     Mental Status: She is alert and oriented to person, place, and time.     Deep Tendon Reflexes: Reflexes are normal and symmetric.     Reflex Scores:      Brachioradialis reflexes are 2+ on the right side and 2+ on the left side.      Patellar reflexes are 2+ on the right side and 2+ on the left side. Psychiatric:        Attention and Perception: Attention normal.        Mood and Affect: Mood normal.        Speech: Speech normal.        Behavior: Behavior normal. Behavior is cooperative.        Thought Content: Thought content  normal.    Results for orders placed or performed in visit on 06/23/23  Bayer DCA Hb A1c Waived   Collection Time: 06/23/23  9:32 AM  Result Value Ref Range   HB A1C (BAYER DCA - WAIVED) 6.7 (H) 4.8 - 5.6 %  Microalbumin, Urine Waived   Collection Time: 06/23/23  9:32 AM  Result Value Ref Range   Microalb, Ur Waived 80 (H) 0 - 19 mg/L   Creatinine, Urine Waived 100 10 - 300 mg/dL   Microalb/Creat Ratio 30-300 (H) <30 mg/g  Comprehensive metabolic panel   Collection Time: 06/23/23  9:32 AM  Result Value Ref Range   Glucose 127 (H) 70 - 99 mg/dL   BUN 11 8 - 27 mg/dL   Creatinine, Ser 3.32 0.57 - 1.00 mg/dL   eGFR 66 >95 JO/ACZ/6.60   BUN/Creatinine Ratio 12 12 - 28   Sodium 141 134 - 144 mmol/L   Potassium 4.1 3.5 - 5.2 mmol/L   Chloride 103 96 - 106 mmol/L   CO2 23 20 - 29 mmol/L   Calcium  9.5 8.7 - 10.3 mg/dL   Total Protein 6.8 6.0 -  8.5 g/dL   Albumin 4.1 3.9 - 4.9 g/dL   Globulin, Total 2.7 1.5 - 4.5 g/dL   Bilirubin Total 0.5 0.0 - 1.2 mg/dL   Alkaline Phosphatase 94 44 - 121 IU/L   AST 14 0 - 40 IU/L   ALT 8 0 - 32 IU/L  Lipid Panel w/o Chol/HDL Ratio   Collection Time: 06/23/23  9:32 AM  Result Value Ref Range   Cholesterol, Total 119 100 - 199 mg/dL   Triglycerides 59 0 - 149 mg/dL   HDL 43 >40 mg/dL   VLDL Cholesterol Cal 13 5 - 40 mg/dL   LDL Chol Calc (NIH) 63 0 - 99 mg/dL      Assessment & Plan:   Problem List Items Addressed This Visit       Cardiovascular and Mediastinum   Hypertension associated with diabetes (HCC)   Chronic, stable.  BP improving on recheck today, is having pain today.  Will continue ARB (Olmesartan  40 MG daily), was on ACE but having improved cough off of this.  Recommend checking BP at home three mornings a week and documenting for provider + focus on DASH diet.  LABS: CMP.  Could consider changing to Valsartan  in future if ongoing elevations in BP or adding Diltiazem.      Relevant Orders   Bayer DCA Hb A1c Waived    Comprehensive metabolic panel with GFR     Endocrine   Type 2 diabetes mellitus with diabetic neuropathy, unspecified (HCC) - Primary   Chronic, ongoing with A1c 6.4% today, trend down.  Urine ALB 80 February 2025, taking ARB.  Will trial increasing Ozempic  to 1 MG dosing, have educated her on this, and reduce Metformin  to once a day.  She will finish out current 0.5 MG pens by doing two injections weekly. Ozempic  increase may assist with weight loss which may in turn help her pain.  Continue daily B12 supplement for low levels in past. Trial change to Lyrica at night. - Eye and foot exams up to date - Vaccinations up to date - ARB and statin on board      Relevant Medications   Blood Glucose Monitoring Suppl (TRUE METRIX AIR GLUCOSE METER) w/Device KIT   Other Relevant Orders   Bayer DCA Hb A1c Waived   Hyperlipidemia associated with type 2 diabetes mellitus (HCC)   Chronic, ongoing.  Continue current medication regimen and adjust as needed.  Lipid panel today.        Relevant Orders   Bayer DCA Hb A1c Waived   Comprehensive metabolic panel with GFR   Lipid Panel w/o Chol/HDL Ratio   Diabetes mellitus with proteinuria (HCC)   Chronic, ongoing with A1c 6.4% today, trend down.  Urine ALB 80 February 2025, taking ARB.  Will trial increasing Ozempic  to 1 MG dosing, have educated her on this, and reduce Metformin  to once a day.  She will finish out current 0.5 MG pens by doing two injections weekly. Continue daily B12 supplement for low levels in past. Trial change to Lyrica at night. - Eye and foot exams up to date - Vaccinations up to date - ARB and statin on board      Relevant Orders   Bayer DCA Hb A1c Waived   Diabetes mellitus treated with injections of non-insulin medication (HCC)   Refer to diabetes with proteinuria plan of care.      Relevant Orders   Bayer DCA Hb A1c Waived     Other   Pain  in both lower extremities   Ongoing for one month with edema slightly worse  today.  Will obtain doppler imaging both legs to ensure no clots. Suspect this may be coming from lower back and hips.  If no DVT present then will consider sending back to ortho and providing slow burst of pain medication to assist.        Relevant Orders   US  Venous Img Lower Bilateral (DVT)   Morbid obesity (HCC)   BMI 49.16, with T2DM and HTN/HLD.  Recommended eating smaller high protein, low fat meals more frequently and exercising 30 mins a day 5 times a week with a goal of 10-15lb weight loss in the next 3 months. Patient voiced their understanding and motivation to adhere to these recommendations.       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  Asymptomatic at this time.  Consider cardiology referral if any worsening heart function.        Follow up plan: Return in about 4 weeks (around 10/24/2023) for T2DM AND LEG PAIN -- medication changes made on 09/26/23.

## 2023-09-26 NOTE — Assessment & Plan Note (Signed)
With aortic sclerosis and moderate LVH past echo.  Asymptomatic at this time.  Consider cardiology referral if any worsening heart function.

## 2023-09-26 NOTE — Assessment & Plan Note (Signed)
 Ongoing for one month with edema slightly worse today.  Will obtain doppler imaging both legs to ensure no clots. Suspect this may be coming from lower back and hips.  If no DVT present then will consider sending back to ortho and providing slow burst of pain medication to assist.

## 2023-09-26 NOTE — Assessment & Plan Note (Signed)
 Chronic, ongoing with A1c 6.4% today, trend down.  Urine ALB 80 February 2025, taking ARB.  Will trial increasing Ozempic  to 1 MG dosing, have educated her on this, and reduce Metformin  to once a day.  She will finish out current 0.5 MG pens by doing two injections weekly. Ozempic  increase may assist with weight loss which may in turn help her pain.  Continue daily B12 supplement for low levels in past. Trial change to Lyrica at night. - Eye and foot exams up to date - Vaccinations up to date - ARB and statin on board

## 2023-09-26 NOTE — Assessment & Plan Note (Signed)
 Chronic, ongoing with A1c 6.4% today, trend down.  Urine ALB 80 February 2025, taking ARB.  Will trial increasing Ozempic  to 1 MG dosing, have educated her on this, and reduce Metformin  to once a day.  She will finish out current 0.5 MG pens by doing two injections weekly. Continue daily B12 supplement for low levels in past. Trial change to Lyrica at night. - Eye and foot exams up to date - Vaccinations up to date - ARB and statin on board

## 2023-09-26 NOTE — Progress Notes (Signed)
 Please let Jalia know that there were no clots present.  I suspect this may be coming more from back or hips.  I recommend heading back to ortho, would you like a referral? Let me know.:)

## 2023-09-26 NOTE — Assessment & Plan Note (Signed)
 Chronic, stable.  BP improving on recheck today, is having pain today.  Will continue ARB (Olmesartan  40 MG daily), was on ACE but having improved cough off of this.  Recommend checking BP at home three mornings a week and documenting for provider + focus on DASH diet.  LABS: CMP.  Could consider changing to Valsartan  in future if ongoing elevations in BP or adding Diltiazem.

## 2023-09-27 LAB — COMPREHENSIVE METABOLIC PANEL WITH GFR
ALT: 12 IU/L (ref 0–32)
AST: 25 IU/L (ref 0–40)
Albumin: 4.3 g/dL (ref 3.9–4.9)
Alkaline Phosphatase: 87 IU/L (ref 44–121)
BUN/Creatinine Ratio: 15 (ref 12–28)
BUN: 13 mg/dL (ref 8–27)
Bilirubin Total: 0.6 mg/dL (ref 0.0–1.2)
CO2: 21 mmol/L (ref 20–29)
Calcium: 9.5 mg/dL (ref 8.7–10.3)
Chloride: 101 mmol/L (ref 96–106)
Creatinine, Ser: 0.89 mg/dL (ref 0.57–1.00)
Globulin, Total: 2.9 g/dL (ref 1.5–4.5)
Glucose: 110 mg/dL — ABNORMAL HIGH (ref 70–99)
Potassium: 4.4 mmol/L (ref 3.5–5.2)
Sodium: 140 mmol/L (ref 134–144)
Total Protein: 7.2 g/dL (ref 6.0–8.5)
eGFR: 70 mL/min/{1.73_m2} (ref >59)

## 2023-09-27 LAB — LIPID PANEL W/O CHOL/HDL RATIO
Cholesterol, Total: 115 mg/dL (ref 100–199)
HDL: 46 mg/dL (ref >39)
LDL Chol Calc (NIH): 54 mg/dL (ref 0–99)
Triglycerides: 72 mg/dL (ref 0–149)
VLDL Cholesterol Cal: 15 mg/dL (ref 5–40)

## 2023-09-28 NOTE — Progress Notes (Signed)
 Good morning, please let Alyzabeth know her labs have returned and are normal.  No medication changes needed.  Also there were no clots present on imaging. I suspect this may be coming more from back or hips. I recommend heading back to ortho, would you like a referral? Let me know.:)  Any questions? Keep being amazing!!  Thank you for allowing me to participate in your care.  I appreciate you. Kindest regards, Rose Hippler

## 2023-09-30 ENCOUNTER — Telehealth: Payer: Self-pay

## 2023-09-30 MED ORDER — PREDNISONE 10 MG PO TABS: ORAL_TABLET | ORAL | 0 refills | Status: DC

## 2023-09-30 MED ORDER — TIZANIDINE HCL 4 MG PO TABS: 4.0000 mg | ORAL_TABLET | Freq: Four times a day (QID) | ORAL | 0 refills | Status: AC | PRN

## 2023-09-30 NOTE — Telephone Encounter (Signed)
Called and notified patient of Jolene's message. Patient verbalized understanding.

## 2023-09-30 NOTE — Telephone Encounter (Signed)
 Copied from CRM [id number]. Topic: Clinical - Lab/Test Results >> Sep 30, 2023  4:17 PM Baldomero Bone wrote: Reason for CRM: Patient returned call regarding lab results. Read note from Jolene Cannady word for word. Patient understood but stated that she did not want to go back to ortho because she has no transportation. Can you prescribe something or recommend a medication. Callback number is [phone]

## 2023-10-22 ENCOUNTER — Other Ambulatory Visit: Payer: Self-pay | Admitting: Nurse Practitioner

## 2023-10-27 NOTE — Telephone Encounter (Signed)
 Unable to refill per protocol, Rx expired. Discontinued 10/25/23, dose change.  Requested Prescriptions  Pending Prescriptions Disp Refills   OZEMPIC , 0.25 OR 0.5 MG/DOSE, 2 MG/3ML SOPN [Pharmacy Med Name: Ozempic  (0.25 or 0.5 MG/DOSE) Subcutaneous Solution Pen-injector 2 MG/3ML]  3    Sig: INJECT 0.5MG  UNDER THE SKIN ONE TIME WEEKLY     Endocrinology:  Diabetes - GLP-1 Receptor Agonists - semaglutide  Failed - 10/27/2023 10:16 AM      Failed - HBA1C in normal range and within 180 days    Hemoglobin A1C  Date Value Ref Range Status  03/09/2015 6.3  Final   HB A1C (BAYER DCA - WAIVED)  Date Value Ref Range Status  09/26/2023 6.4 (H) 4.8 - 5.6 % Final    Comment:             Prediabetes: 5.7 - 6.4          Diabetes: >6.4          Glycemic control for adults with diabetes: <7.0          Passed - Cr in normal range and within 360 days    Creatinine  Date Value Ref Range Status  06/03/2013 0.77 0.60 - 1.30 mg/dL Final   Creatinine, Ser  Date Value Ref Range Status  09/26/2023 0.89 0.57 - 1.00 mg/dL Final         Passed - Valid encounter within last 6 months    Recent Outpatient Visits           1 month ago Type 2 diabetes mellitus with diabetic neuropathy, without long-term current use of insulin (HCC)   [place] [facility] [place], Camargo T, NP   4 months ago Diabetes mellitus with proteinuria (HCC)   [facility] [place], Melanie DASEN, NP

## 2023-10-28 ENCOUNTER — Other Ambulatory Visit: Payer: Self-pay

## 2023-10-28 MED ORDER — OZEMPIC (1 MG/DOSE) 2 MG/1.5ML ~~LOC~~ SOPN: 1.0000 mg | PEN_INJECTOR | SUBCUTANEOUS | 3 refills | Status: DC

## 2023-10-30 ENCOUNTER — Ambulatory Visit: Admitting: Nurse Practitioner

## 2023-10-30 ENCOUNTER — Encounter: Payer: Self-pay | Admitting: Nurse Practitioner

## 2023-10-30 VITALS — BP 126/84 | HR 67 | Temp 98.2°F | Ht 60.0 in | Wt 255.6 lb

## 2023-10-30 DIAGNOSIS — E1140 Type 2 diabetes mellitus with diabetic neuropathy, unspecified: Secondary | ICD-10-CM

## 2023-10-30 MED ORDER — PREGABALIN 75 MG PO CAPS
75.0000 mg | ORAL_CAPSULE | Freq: Two times a day (BID) | ORAL | 5 refills | Status: DC
Start: 2023-10-30 — End: 2023-12-16

## 2023-10-30 NOTE — Patient Instructions (Signed)
 Neuropathic Pain Neuropathic pain is pain caused by damage to the nerves that are responsible for certain sensations in your body (sensory nerves). Neuropathic pain can make you more sensitive to pain. Even a minor sensation can feel very painful. This is usually a long-term (chronic) condition that can be difficult to treat. The type of pain differs from person to person. It may: Start suddenly (acute), or it may develop slowly and become chronic. Come and go as damaged nerves heal, or it may stay at the same level for years. Cause emotional distress, loss of sleep, and a lower quality of life. What are the causes? The most common cause of this condition is diabetes. Many other diseases and conditions can also cause neuropathic pain. Causes of neuropathic pain can be classified as: Toxic. This is caused by medicines and chemicals. The most common causes of toxic neuropathic pain is damage from medicines that kill cancer cells (chemotherapy) or alcohol abuse. Metabolic. This can be caused by: Diabetes. Lack of vitamins like B12. Traumatic. Any injury that cuts, crushes, or stretches a nerve can cause damage and pain. Compression-related. If a sensory nerve gets trapped or compressed for a long period of time, the blood supply to the nerve can be cut off. Vascular. Many blood vessel diseases can cause neuropathic pain by decreasing blood supply and oxygen to nerves. Autoimmune. This type of pain results from diseases in which the body's defense system (immune system) mistakenly attacks sensory nerves. Examples of autoimmune diseases that can cause neuropathic pain include lupus and multiple sclerosis. Infectious. Many types of viral infections can damage sensory nerves and cause pain. Shingles infection is a common cause of this type of pain. Inherited. Neuropathic pain can be a symptom of many diseases that are passed down through families (genetic). What increases the risk? You are more likely to  develop this condition if: You have diabetes. You smoke. You drink too much alcohol. You are taking certain medicines, including chemotherapy or medicines that treat immune system disorders. What are the signs or symptoms? The main symptom is pain. Neuropathic pain is often described as: Burning. Shock-like. Stinging. Hot or cold. Itching. How is this diagnosed? No single test can diagnose neuropathic pain. It is diagnosed based on: A physical exam and your symptoms. Your health care provider will ask you about your pain. You may be asked to use a pain scale to describe how bad your pain is. Tests. These may be done to see if you have a cause and location of any nerve damage. They include: Nerve conduction studies and electromyography to test how well nerve signals travel through your nerves and muscles (electrodiagnostic testing). Skin biopsy to evaluate for small fiber neuropathy. Imaging studies, such as: X-rays. CT scan. MRI. How is this treated? Treatment for neuropathic pain may change over time. You may need to try different treatment options or a combination of treatments. Some options include: Treating the underlying cause of the neuropathy, such as diabetes, kidney disease, or vitamin deficiencies. Stopping medicines that can cause neuropathy, such as chemotherapy. Medicine to relieve pain. Medicines may include: Prescription or over-the-counter pain medicine. Anti-seizure medicine. Antidepressant medicines. Pain-relieving patches or creams that are applied to painful areas of skin. A medicine to numb the area (local anesthetic), which can be injected as a nerve block. Transcutaneous nerve stimulation. This uses electrical currents to block painful nerve signals. The treatment is painless. Alternative treatments, such as: Acupuncture. Meditation. Massage. Occupational or physical therapy. Pain management programs. Counseling. Follow  these instructions at  home: Medicines  Take over-the-counter and prescription medicines only as told by your health care provider. Ask your health care provider if the medicine prescribed to you: Requires you to avoid driving or using machinery. Can cause constipation. You may need to take these actions to prevent or treat constipation: Drink enough fluid to keep your urine pale yellow. Take over-the-counter or prescription medicines. Eat foods that are high in fiber, such as beans, whole grains, and fresh fruits and vegetables. Limit foods that are high in fat and processed sugars, such as fried or sweet foods. Lifestyle  Have a good support system at home. Consider joining a chronic pain support group. Do not use any products that contain nicotine or tobacco. These products include cigarettes, chewing tobacco, and vaping devices, such as e-cigarettes. If you need help quitting, ask your health care provider. Do not drink alcohol. General instructions Learn as much as you can about your condition. Work closely with all your health care providers to find the treatment plan that works best for you. Ask your health care provider what activities are safe for you. Keep all follow-up visits. This is important. Contact a health care provider if: Your pain treatments are not working. You are having side effects from your medicines. You are struggling with tiredness (fatigue), mood changes, depression, or anxiety. Get help right away if: You have thoughts of hurting yourself. Get help right away if you feel like you may hurt yourself or others, or have thoughts about taking your own life. Go to your nearest emergency room or: Call 911. Call the National Suicide Prevention Lifeline at 947-472-5826 or 988. This is open 24 hours a day. Text the Crisis Text Line at 657-070-3362. Summary Neuropathic pain is pain caused by damage to the nerves that are responsible for certain sensations in your body (sensory  nerves). Neuropathic pain may come and go as damaged nerves heal, or it may stay at the same level for years. Neuropathic pain is usually a long-term condition that can be difficult to treat. Consider joining a chronic pain support group. This information is not intended to replace advice given to you by your health care provider. Make sure you discuss any questions you have with your health care provider. Document Revised: 12/04/2020 Document Reviewed: 12/04/2020 Elsevier Patient Education  2024 [employer].

## 2023-10-30 NOTE — Progress Notes (Signed)
 BP 126/84 (BP Location: Left Arm, Patient Position: Sitting, Cuff Size: Large)   Pulse 67   Temp 98.2 F (36.8 C) (Oral)   Ht 5' (1.524 m)   Wt 255 lb 9.6 oz (115.9 kg)   SpO2 97%   BMI 49.92 kg/m    Subjective:    Patient ID: Jillian Pierce, female    DOB: Mar 22, 1956, 68 y.o.   MRN: [id number]  HPI: Jillian Pierce is a 68 y.o. female  Chief Complaint  Patient presents with   Diabetes   Leg Pain    Patient states she is still having a lot of pain in her legs, rating pain at a 10 today   DIABETES Follow-up today for increase in Ozempic  to 1 MG on 09/26/23.  A1c in June was 6.4%.  Continues to take Metformin . Hypoglycemic episodes:no Polydipsia/polyuria: no Visual disturbance: no Chest pain: no Paresthesias: no Glucose Monitoring: yes  Accucheck frequency: Daily  Fasting glucose: 123 this morning  Post prandial:  Evening:  Before meals: Taking Insulin?: no  Long acting insulin:  Short acting insulin: Blood Pressure Monitoring: not checking Retinal Examination: Up to Date Foot Exam: Up to Date Diabetic Education: Completed Pneumovax: Up to Date Influenza: Up to Date Aspirin : yes   LEG PAIN: Having ongoing leg pain to both legs.  Recent ultrasound showed no DVTs and labs overall reassuring.  Had MRI in January 2025 that noted severe spinal canal stenosis L4-L5. Taking Pregabalin  at bedtime, started recently 75 MG. Duration: months Pain: yes Severity: 10/10  Quality:  dull and aching and burning Location:  lower legs Bilateral:  yes Onset: gradual Frequency: constant Time of  day:   all day Sudden unintentional leg jerking:   yes Paresthesias:   yes, tingling at times Decreased sensation:  no Weakness:   sometimes Insomnia:   yes Fatigue:   yes Alleviating factors: Lyrica  helps a little Aggravating factors: nothing Status: fluctuating Treatments attempted: Lyrica , Tylenol   Relevant past medical, surgical, family and social history reviewed and updated as  indicated. Interim medical history since our last visit reviewed. Allergies and medications reviewed and updated.  Review of Systems  Constitutional:  Negative for activity change, appetite change, diaphoresis, fatigue and fever.  Respiratory:  Positive for shortness of breath (at baseline, no worsening). Negative for cough, chest tightness and wheezing.   Cardiovascular:  Positive for leg swelling (occasional if sitting a lot). Negative for chest pain and palpitations.  Gastrointestinal: Negative.   Endocrine: Negative for cold intolerance, heat intolerance, polydipsia, polyphagia and polyuria.  Musculoskeletal:  Positive for arthralgias.  Neurological: Negative.   Psychiatric/Behavioral: Negative.      Per HPI unless specifically indicated above     Objective:    BP 126/84 (BP Location: Left Arm, Patient Position: Sitting, Cuff Size: Large)   Pulse 67   Temp 98.2 F (36.8 C) (Oral)   Ht 5' (1.524 m)   Wt 255 lb 9.6 oz (115.9 kg)   SpO2 97%   BMI 49.92 kg/m   Wt Readings from Last 3 Encounters:  10/30/23 255 lb 9.6 oz (115.9 kg)  09/26/23 253 lb 6.4 oz (114.9 kg)  07/08/23 240 lb (108.9 kg)    Physical Exam Vitals and nursing note reviewed.  Constitutional:      General: She is awake. She is not in acute distress.    Appearance: She is well-developed and well-groomed. She is obese. She is not ill-appearing or toxic-appearing.  HENT:     Head: Normocephalic.  Right Ear: Hearing and external ear normal.     Left Ear: Hearing and external ear normal.  Eyes:     General: Lids are normal.        Right eye: No discharge.        Left eye: No discharge.     Conjunctiva/sclera: Conjunctivae normal.     Pupils: Pupils are equal, round, and reactive to light.  Neck:     Thyroid : No thyromegaly.     Vascular: No carotid bruit.  Cardiovascular:     Rate and Rhythm: Normal rate and regular rhythm.     Pulses:          Dorsalis pedis pulses are 2+ on the right side and 2+  on the left side.       Posterior tibial pulses are 2+ on the right side and 2+ on the left side.     Heart sounds: Murmur heard.     Systolic murmur is present with a grade of 2/6.     No gallop.     Comments: Pain with pushing up on feet both sides. Pulmonary:     Effort: Pulmonary effort is normal. No accessory muscle usage or respiratory distress.     Breath sounds: Normal breath sounds.  Abdominal:     General: Bowel sounds are normal. There is no distension.     Palpations: Abdomen is soft.     Tenderness: There is no abdominal tenderness.  Musculoskeletal:     Cervical back: Normal range of motion and neck supple.     Right lower leg: No deformity, tenderness or bony tenderness. 1+ Pitting Edema present.     Left lower leg: No deformity, tenderness or bony tenderness. 1+ Pitting Edema present.     Right foot: Normal range of motion.     Left foot: Normal range of motion.  Feet:     Right foot:     Protective Sensation: 10 sites tested.  10 sites sensed.     Skin integrity: Skin integrity normal.     Left foot:     Protective Sensation: 10 sites tested.  10 sites sensed.     Skin integrity: Skin integrity normal.  Lymphadenopathy:     Cervical: No cervical adenopathy.  Skin:    General: Skin is warm and dry.  Neurological:     Mental Status: She is alert and oriented to person, place, and time.     Deep Tendon Reflexes: Reflexes are normal and symmetric.     Reflex Scores:      Brachioradialis reflexes are 2+ on the right side and 2+ on the left side.      Patellar reflexes are 2+ on the right side and 2+ on the left side. Psychiatric:        Attention and Perception: Attention normal.        Mood and Affect: Mood normal.        Speech: Speech normal.        Behavior: Behavior normal. Behavior is cooperative.        Thought Content: Thought content normal.    Results for orders placed or performed in visit on 09/26/23  Bayer DCA Hb A1c Waived   Collection Time:  09/26/23  9:00 AM  Result Value Ref Range   HB A1C (BAYER DCA - WAIVED) 6.4 (H) 4.8 - 5.6 %  Comprehensive metabolic panel with GFR   Collection Time: 09/26/23  9:00 AM  Result Value Ref Range  Glucose 110 (H) 70 - 99 mg/dL   BUN 13 8 - 27 mg/dL   Creatinine, Ser 9.10 0.57 - 1.00 mg/dL   eGFR 70 >40 fO/fpw/8.26   BUN/Creatinine Ratio 15 12 - 28   Sodium 140 134 - 144 mmol/L   Potassium 4.4 3.5 - 5.2 mmol/L   Chloride 101 96 - 106 mmol/L   CO2 21 20 - 29 mmol/L   Calcium  9.5 8.7 - 10.3 mg/dL   Total Protein 7.2 6.0 - 8.5 g/dL   Albumin 4.3 3.9 - 4.9 g/dL   Globulin, Total 2.9 1.5 - 4.5 g/dL   Bilirubin Total 0.6 0.0 - 1.2 mg/dL   Alkaline Phosphatase 87 44 - 121 IU/L   AST 25 0 - 40 IU/L   ALT 12 0 - 32 IU/L  Lipid Panel w/o Chol/HDL Ratio   Collection Time: 09/26/23  9:00 AM  Result Value Ref Range   Cholesterol, Total 115 100 - 199 mg/dL   Triglycerides 72 0 - 149 mg/dL   HDL 46 >60 mg/dL   VLDL Cholesterol Cal 15 5 - 40 mg/dL   LDL Chol Calc (NIH) 54 0 - 99 mg/dL      Assessment & Plan:   Problem List Items Addressed This Visit       Endocrine   Diabetic neuropathy, painful (HCC) - Primary   Chronic, ongoing with A1c 6.4% in June, trend down.  Urine ALB 80 February 2025, taking ARB.  Will continue Ozempic  1 MG a day and adjust further next visit + continue Metformin  once a day  Ozempic  increase may assist with weight loss which may in turn help her pain.  Continue daily B12 supplement for low levels in past. Will change Lyrica  to 75 MG BID.  Order for home health to work on back and lower legs + balance.  Provided her with samples of Neurvive to trial and recommend she apply Voltaren  gel and Magnesium cream to legs as needed. - Eye and foot exams up to date - Vaccinations up to date - ARB and statin on board      Relevant Medications   OZEMPIC , 1 MG/DOSE, 4 MG/3ML SOPN   Other Relevant Orders   Ambulatory referral to Home Health     Follow up plan: Return in  about 6 weeks (around 12/11/2023) for Neuropathy.

## 2023-10-30 NOTE — Assessment & Plan Note (Signed)
 Chronic, ongoing with A1c 6.4% in June, trend down.  Urine ALB 80 February 2025, taking ARB.  Will continue Ozempic  1 MG a day and adjust further next visit + continue Metformin  once a day  Ozempic  increase may assist with weight loss which may in turn help her pain.  Continue daily B12 supplement for low levels in past. Will change Lyrica  to 75 MG BID.  Order for home health to work on back and lower legs + balance.  Provided her with samples of Neurvive to trial and recommend she apply Voltaren  gel and Magnesium cream to legs as needed. - Eye and foot exams up to date - Vaccinations up to date - ARB and statin on board

## 2023-10-31 ENCOUNTER — Other Ambulatory Visit: Payer: Self-pay | Admitting: Nurse Practitioner

## 2023-11-03 ENCOUNTER — Telehealth: Payer: Self-pay

## 2023-11-03 NOTE — Telephone Encounter (Signed)
 Requested Prescriptions  Pending Prescriptions Disp Refills   olmesartan  (BENICAR ) 40 MG tablet [Pharmacy Med Name: Olmesartan  Medoxomil Oral Tablet 40 MG] 90 tablet 3    Sig: TAKE 1 TABLET EVERY DAY     Cardiovascular:  Angiotensin Receptor Blockers Passed - 11/03/2023  1:37 PM      Passed - Cr in normal range and within 180 days    Creatinine  Date Value Ref Range Status  06/03/2013 0.77 0.60 - 1.30 mg/dL Final   Creatinine, Ser  Date Value Ref Range Status  09/26/2023 0.89 0.57 - 1.00 mg/dL Final         Passed - K in normal range and within 180 days    Potassium  Date Value Ref Range Status  09/26/2023 4.4 3.5 - 5.2 mmol/L Final    Comment:    Specimen received hemolyzed. Value may be increased by hemolysis. Clinical correlation indicated.   06/03/2013 3.1 (L) 3.5 - 5.1 mmol/L Final         Passed - Patient is not pregnant      Passed - Last BP in normal range    BP Readings from Last 1 Encounters:  10/30/23 126/84         Passed - Valid encounter within last 6 months    Recent Outpatient Visits           4 days ago Diabetic neuropathy, painful (HCC)   [place] [facility] [place], [place] T, NP   1 month ago Type 2 diabetes mellitus with diabetic neuropathy, without long-term current use of insulin (HCC)   [place] [facility] [place], [place] T, NP   4 months ago Diabetes mellitus with proteinuria [facility])   [place] [facility] [place], Melanie DASEN, NP

## 2023-11-03 NOTE — Telephone Encounter (Signed)
 Noted

## 2023-11-03 NOTE — Telephone Encounter (Unsigned)
 Copied from CRM [id number]. Topic: Clinical - Medical Advice >> Nov 03, 2023  4:26 PM Charolett L wrote: Reason for CRM: Soni from [facility] called  Patient declined the need for in home PT Cb# [phone]

## 2023-11-07 DIAGNOSIS — H269 Unspecified cataract: Secondary | ICD-10-CM | POA: Diagnosis not present

## 2023-11-07 DIAGNOSIS — H2513 Age-related nuclear cataract, bilateral: Secondary | ICD-10-CM | POA: Diagnosis not present

## 2023-11-07 DIAGNOSIS — Z0100 Encounter for examination of eyes and vision without abnormal findings: Secondary | ICD-10-CM | POA: Diagnosis not present

## 2023-11-07 DIAGNOSIS — E119 Type 2 diabetes mellitus without complications: Secondary | ICD-10-CM | POA: Diagnosis not present

## 2023-12-13 NOTE — Patient Instructions (Signed)
 Nerve Damage From Diabetes (Diabetic Neuropathy): What to Know Diabetic neuropathy is when diabetes causes damage to your nerves. Over time, people with diabetes can get nerve damage throughout the body.  There are different types of diabetic neuropathy: Peripheral neuropathy. This is the most common type. It damages the peripheral nerves. These are nerves that send signals between the spinal cord and other parts of the body. This type usually affects the feet, legs, hands, and arms. Autonomic neuropathy. This type causes damage to autonomic nerves. These nerves control things that your body does automatically and that you don't control. This includes the heartbeat, body temperature, blood pressure, peeing, digestion, sweating, and sexual function. It can also affect your body's response to changes in blood sugar (glucose). Focal neuropathy. This type affects one area of the body, like an arm, a leg, or the face. It may involve one nerve or a small group of nerves. It can be painful and hard to predict. It occurs most often in older adults with diabetes. This type may come on suddenly. It usually gets better over time and doesn't cause long-term problems. Proximal neuropathy. This type affects the nerves of the thighs, hips, butt, or legs. It causes very bad pain, weakness, and muscle death, usually in the thigh muscles. It's more common among older men and people who have type 2 diabetes. The time it takes to recover may vary. What are the causes? Peripheral, autonomic, and focal neuropathies are caused by diabetes that's not well controlled with treatment.  The cause of proximal neuropathy isn't known. It may be linked to irritation and swelling caused by not controlling blood sugar levels. What are the signs or symptoms? Peripheral neuropathy The nerve damage happens slowly over time. When the nerves of the feet and legs no longer work, you may have: Burning, stabbing, or aching pain in the legs or  feet. Cramping in the legs or feet. Loss of feeling (numbness) and not being able to feel pressure or pain in the feet. This can lead to: Thick calluses or sores, also called ulcers, on areas of constant pressure. Not being able to feel temperature changes. Changes in the shape of the feet. Muscle weakness. Loss of balance or coordination. Autonomic neuropathy Symptoms vary depending on which nerves are affected. Symptoms may include: Problems with digestion, like: Throwing up or feeling like you may throw up. Poor appetite. Bloating. Watery poop (diarrhea) or trouble pooping (constipation). Trouble swallowing. Losing weight without trying to. Problems with the heart, blood, and lungs, such as: Feeling dizzy, especially when standing up. Fainting. Shortness of breath. Irregular heartbeat. Bladder problems, such as: Trouble starting or stopping when you pee. Leaking pee. Trouble emptying the bladder. Urinary tract infections (UTIs). Problems with other body functions, such as: Sweat. You may sweat too much or too little. Temperature. You might get hot easily. Or, you might feel cold more than usual. Sexual function. Males may not be able to get or keep an erection. Females may have vaginal dryness and trouble with arousal. Focal neuropathy Symptoms affect only one area of the body. Common symptoms include: Numbness or tingling. Burning pain. A prickling feeling. Very sensitive skin. Weakness. Not being able to move (paralysis). Muscle twitching. Muscles getting smaller, or wasting. Poor coordination. Double or blurred vision. Proximal neuropathy Sudden, very bad pain in the hip, thigh, or butt. Pain may spread from the back into the legs. This is called sciatica. Pain and numbness in the arms and legs. Tingling. Losing control of peeing  and pooping. Weakness and wasting of thigh muscles. Trouble getting up from a seated position. Swelling of the belly. Losing weight  without trying to. How is this diagnosed? Diagnosis varies depending on the type of neuropathy your health care provider thinks you have. It may include: A neurologic exam. This exam checks: Your reflexes. How you move. What you can feel. Blood tests. Lumbar puncture. This tests the fluid that surrounds the spinal cord. Imaging tests, such as: A CT scan. An MRI. Tests on the nerves, such as: Electromyogram (EMG). This checks the nerves that control muscles. Nerve conduction study. This test checks how quickly signals pass through your nerves. Biopsy. This is when a small piece of a nerve is removed for testing. For autonomic neuropathy, you may have tests to check things like: Your blood pressure. Your heart rate. Your breathing. Your digestion. Your bladder. There's no specific test for proximal neuropathy. But you may have tests to rule out other causes of your symptoms. How is this treated? The goal of treatment is to keep nerve damage from getting worse. Treatment may include: Following your diabetes management plan. This will help keep your blood sugar level and your A1C level within your target range. This is the most important treatment. Taking pain medicine. Follow these instructions at home: Diabetes management Follow your diabetes management plan as told by your provider. Check your blood sugar levels. Keep your blood sugar in your target range. Have your A1C level checked at least 2 times a year, or as often as told. Take your medicines only as told. This includes insulin  and diabetes medicine. Lifestyle  Do not smoke, vape, or use nicotine or tobacco. Be physically active every day. Include strength training and balance exercises. Follow a healthy meal plan. Work with your provider to manage your blood pressure. General instructions Ask your provider if it's safe to drive or use machines while taking your medicine. Check your skin and feet every day  for: Cuts. Bruises. Redness. Blisters. Sores. Contact a health care provider if: You have any new symptoms. Your symptoms get worse. Your symptoms don't get better with treatment. Get help right away if: You have sudden weakness or loss of coordination. You have trouble speaking. You have pain or pressure in your chest. You're not able to move a part of your body all of a sudden. These symptoms may be an emergency. Call 911 right away. Do not wait to see if the symptoms will go away. Do not drive yourself to the hospital. This information is not intended to replace advice given to you by your health care provider. Make sure you discuss any questions you have with your health care provider. Document Revised: 12/11/2022 Document Reviewed: 12/11/2022 Elsevier Patient Education  2025 [employer].

## 2023-12-16 ENCOUNTER — Ambulatory Visit: Admitting: Nurse Practitioner

## 2023-12-16 ENCOUNTER — Encounter: Payer: Self-pay | Admitting: Nurse Practitioner

## 2023-12-16 VITALS — BP 136/78 | HR 69 | Temp 98.0°F | Ht 60.0 in | Wt 258.4 lb

## 2023-12-16 DIAGNOSIS — E1140 Type 2 diabetes mellitus with diabetic neuropathy, unspecified: Secondary | ICD-10-CM

## 2023-12-16 MED ORDER — PREGABALIN 100 MG PO CAPS: 100.0000 mg | ORAL_CAPSULE | Freq: Two times a day (BID) | ORAL | 1 refills | Status: DC

## 2023-12-16 NOTE — Assessment & Plan Note (Signed)
 Chronic, ongoing with A1c 6.4% in June, trend down.  Urine ALB 80 February 2025, taking ARB.  Will continue Ozempic  1 MG a day and adjust further next visit + continue Metformin  once a day  Ozempic  increase next visit may assist with weight loss which may in turn help her pain.  Continue daily B12 supplement for low levels in past. Will change Lyrica  to 100 MG BID.  Recommend she apply Voltaren  gel and Magnesium cream to legs as needed. Discussed getting her back into physiatry for discussion on injections, she wishes to hold off on this.  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 - Eye and foot exams up to date - Vaccinations up to date - ARB and statin on board

## 2023-12-16 NOTE — Progress Notes (Signed)
 BP 136/78 (BP Location: Left Arm)   Pulse 69   Temp 98 F (36.7 C) (Oral)   Ht 5' (1.524 m)   Wt 258 lb 6.4 oz (117.2 kg)   SpO2 96%   BMI 50.47 kg/m    Subjective:    Patient ID: Jillian Pierce, female    DOB: 1955/10/03, 68 y.o.   MRN: [id number]  HPI: SHAMMARA JARRETT is a 68 y.o. female  Chief Complaint  Patient presents with   Diabetic Neuropathy    Patient states she is still having pain in her feet, left worse than the right    NEUROPATHY Follow-up today for neuropathy pain, was changed to Lyrica  at visit on 10/30/23 which has helped a little bit.  Gabapentin  stopped.  Last A1c in June was 6.4%.  Reports continues to have neuropathy pain, L>R.  MRI January did not severe spinal canal stenosis L4-L5.  Attended physical therapy in April and saw physiatry on 07/07/23. Neuropathy status: uncontrolled  Satisfied with current treatment?: yes Medication side effects: no Medication compliance:  good compliance Location: lower legs and feet Pain: yes Severity:7/10 present and 9/10 at bedtime Quality:  dull, aching, burning, and throbbing Frequency: constant Bilateral: yes Symmetric: yes Numbness: yes Decreased sensation: yes Weakness: no Context: fluctuating Alleviating factors: nothing Aggravating factors: nothing Treatments attempted: Lyrica , Gabapentin , Tylenol      12/16/2023    9:48 AM 09/26/2023    8:53 AM 07/08/2023    9:26 AM 06/23/2023    9:29 AM 05/16/2023    9:22 AM  Depression screen PHQ 2/9  Decreased Interest 0 0 0 0 0  Down, Depressed, Hopeless 0 0 0 0 0  PHQ - 2 Score 0 0 0 0 0  Altered sleeping 1 1 0 1 0  Tired, decreased energy 1 1 0 1 1  Change in appetite 0 0 0 0 0  Feeling bad or failure about yourself  0 0 0 0 0  Trouble concentrating 0 0 0 0 0  Moving slowly or fidgety/restless 0 0 0 0 0  Suicidal thoughts 0 0 0 0 0  PHQ-9 Score 2 2 0 2 1  Difficult doing work/chores Not difficult at all Not difficult at all Not difficult at all Not difficult at  all Not difficult at all       12/16/2023    9:48 AM 09/26/2023    8:54 AM 06/23/2023    9:29 AM 05/16/2023    9:22 AM  GAD 7 : Generalized Anxiety Score  Nervous, Anxious, on Edge 0 0 0 0  Control/stop worrying 0 0 0 0  Worry too much - different things 0 0 0 0  Trouble relaxing 0 1 0 0  Restless 0 0 0 0  Easily annoyed or irritable 0 0 0 0  Afraid - awful might happen 0 0 0 0  Total GAD 7 Score 0 1 0 0  Anxiety Difficulty Not difficult at all Not difficult at all Not difficult at all Not difficult at all      Relevant past medical, surgical, family and social history reviewed and updated as indicated. Interim medical history since our last visit reviewed. Allergies and medications reviewed and updated.  Review of Systems  Constitutional:  Negative for activity change, appetite change, diaphoresis, fatigue and fever.  Respiratory:  Positive for shortness of breath (at baseline, no worsening). Negative for cough, chest tightness and wheezing.   Cardiovascular:  Positive for leg swelling (occasional if sitting a  lot). Negative for chest pain and palpitations.  Gastrointestinal: Negative.   Endocrine: Negative for cold intolerance, heat intolerance, polydipsia, polyphagia and polyuria.  Musculoskeletal:  Positive for arthralgias.  Neurological: Negative.   Psychiatric/Behavioral: Negative.      Per HPI unless specifically indicated above     Objective:    BP 136/78 (BP Location: Left Arm)   Pulse 69   Temp 98 F (36.7 C) (Oral)   Ht 5' (1.524 m)   Wt 258 lb 6.4 oz (117.2 kg)   SpO2 96%   BMI 50.47 kg/m   Wt Readings from Last 3 Encounters:  12/16/23 258 lb 6.4 oz (117.2 kg)  10/30/23 255 lb 9.6 oz (115.9 kg)  09/26/23 253 lb 6.4 oz (114.9 kg)    Physical Exam Vitals and nursing note reviewed.  Constitutional:      General: She is awake. She is not in acute distress.    Appearance: She is well-developed and well-groomed. She is obese. She is not ill-appearing or  toxic-appearing.  HENT:     Head: Normocephalic.     Right Ear: Hearing and external ear normal.     Left Ear: Hearing and external ear normal.  Eyes:     General: Lids are normal.        Right eye: No discharge.        Left eye: No discharge.     Conjunctiva/sclera: Conjunctivae normal.     Pupils: Pupils are equal, round, and reactive to light.  Neck:     Thyroid : No thyromegaly.     Vascular: No carotid bruit.  Cardiovascular:     Rate and Rhythm: Normal rate and regular rhythm.     Pulses:          Dorsalis pedis pulses are 2+ on the right side and 2+ on the left side.       Posterior tibial pulses are 2+ on the right side and 2+ on the left side.     Heart sounds: Murmur heard.     Systolic murmur is present with a grade of 2/6.     No gallop.  Pulmonary:     Effort: Pulmonary effort is normal. No accessory muscle usage or respiratory distress.     Breath sounds: Normal breath sounds.  Abdominal:     General: Bowel sounds are normal. There is no distension.     Palpations: Abdomen is soft.     Tenderness: There is no abdominal tenderness.  Musculoskeletal:     Cervical back: Normal range of motion and neck supple.     Right lower leg: No deformity, tenderness or bony tenderness. Edema (trace) present.     Left lower leg: No deformity, tenderness or bony tenderness. Edema (trace) present.     Right foot: Normal range of motion.     Left foot: Normal range of motion.  Feet:     Right foot:     Protective Sensation: 8 sites tested.  10 sites sensed.     Skin integrity: Skin integrity normal.     Left foot:     Protective Sensation: 7 sites tested.  10 sites sensed.     Skin integrity: Skin integrity normal.  Lymphadenopathy:     Cervical: No cervical adenopathy.  Skin:    General: Skin is warm and dry.  Neurological:     Mental Status: She is alert and oriented to person, place, and time.     Deep Tendon Reflexes: Reflexes are normal and  symmetric.     Reflex  Scores:      Brachioradialis reflexes are 2+ on the right side and 2+ on the left side.      Patellar reflexes are 2+ on the right side and 2+ on the left side. Psychiatric:        Attention and Perception: Attention normal.        Mood and Affect: Mood normal.        Speech: Speech normal.        Behavior: Behavior normal. Behavior is cooperative.        Thought Content: Thought content normal.     Results for orders placed or performed in visit on 09/26/23  Bayer DCA Hb A1c Waived   Collection Time: 09/26/23  9:00 AM  Result Value Ref Range   HB A1C (BAYER DCA - WAIVED) 6.4 (H) 4.8 - 5.6 %  Comprehensive metabolic panel with GFR   Collection Time: 09/26/23  9:00 AM  Result Value Ref Range   Glucose 110 (H) 70 - 99 mg/dL   BUN 13 8 - 27 mg/dL   Creatinine, Ser 9.10 0.57 - 1.00 mg/dL   eGFR 70 >40 fO/fpw/8.26   BUN/Creatinine Ratio 15 12 - 28   Sodium 140 134 - 144 mmol/L   Potassium 4.4 3.5 - 5.2 mmol/L   Chloride 101 96 - 106 mmol/L   CO2 21 20 - 29 mmol/L   Calcium  9.5 8.7 - 10.3 mg/dL   Total Protein 7.2 6.0 - 8.5 g/dL   Albumin 4.3 3.9 - 4.9 g/dL   Globulin, Total 2.9 1.5 - 4.5 g/dL   Bilirubin Total 0.6 0.0 - 1.2 mg/dL   Alkaline Phosphatase 87 44 - 121 IU/L   AST 25 0 - 40 IU/L   ALT 12 0 - 32 IU/L  Lipid Panel w/o Chol/HDL Ratio   Collection Time: 09/26/23  9:00 AM  Result Value Ref Range   Cholesterol, Total 115 100 - 199 mg/dL   Triglycerides 72 0 - 149 mg/dL   HDL 46 >60 mg/dL   VLDL Cholesterol Cal 15 5 - 40 mg/dL   LDL Chol Calc (NIH) 54 0 - 99 mg/dL      Assessment & Plan:   Problem List Items Addressed This Visit       Endocrine   Diabetic neuropathy, painful (HCC) - Primary   Chronic, ongoing with A1c 6.4% in June, trend down.  Urine ALB 80 February 2025, taking ARB.  Will continue Ozempic  1 MG a day and adjust further next visit + continue Metformin  once a day  Ozempic  increase next visit may assist with weight loss which may in turn help her  pain.  Continue daily B12 supplement for low levels in past. Will change Lyrica  to 100 MG BID.  Recommend she apply Voltaren  gel and Magnesium cream to legs as needed. Discussed getting her back into physiatry for discussion on injections, she wishes to hold off on this.  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 - Eye and foot exams up to date - Vaccinations up to date - ARB and statin on board        Follow up plan: Return in about 4 weeks (around 01/13/2024) for T2DM, HTN/HLD, NEUROPATHY.

## 2023-12-21 ENCOUNTER — Other Ambulatory Visit: Payer: Self-pay | Admitting: Nurse Practitioner

## 2023-12-23 NOTE — Telephone Encounter (Signed)
 Requested Prescriptions  Pending Prescriptions Disp Refills   glucose blood (TRUE METRIX BLOOD GLUCOSE TEST) test strip [Pharmacy Med Name: TRUE METRIX SELF MONITORI] 200 strip 3    Sig: CHECK BLOOD SUGAR 1 TO 2 TIMES DAILY     Endocrinology: Diabetes - Testing Supplies Passed - 12/23/2023 12:32 PM      Passed - Valid encounter within last 12 months    Recent Outpatient Visits           1 week ago Diabetic neuropathy, painful (HCC)   [place] [facility] [place], [place] T, NP   1 month ago Diabetic neuropathy, painful (HCC)   [place] [facility] [place], [place] T, NP   2 months ago Type 2 diabetes mellitus with diabetic neuropathy, without long-term current use of insulin (HCC)   [place] [facility] [place], [place] T, NP   6 months ago Diabetes mellitus with proteinuria [facility])   [place] [facility] [place], [place] DASEN, NP

## 2024-01-18 NOTE — Patient Instructions (Signed)

## 2024-01-20 ENCOUNTER — Ambulatory Visit (INDEPENDENT_AMBULATORY_CARE_PROVIDER_SITE_OTHER): Admitting: Nurse Practitioner

## 2024-01-20 ENCOUNTER — Encounter: Payer: Self-pay | Admitting: Nurse Practitioner

## 2024-01-20 VITALS — BP 136/84 | HR 74 | Temp 97.9°F | Resp 16 | Ht 60.0 in | Wt 261.4 lb

## 2024-01-20 DIAGNOSIS — E1169 Type 2 diabetes mellitus with other specified complication: Secondary | ICD-10-CM | POA: Diagnosis not present

## 2024-01-20 DIAGNOSIS — E1129 Type 2 diabetes mellitus with other diabetic kidney complication: Secondary | ICD-10-CM | POA: Diagnosis not present

## 2024-01-20 DIAGNOSIS — E785 Hyperlipidemia, unspecified: Secondary | ICD-10-CM | POA: Diagnosis not present

## 2024-01-20 DIAGNOSIS — E1159 Type 2 diabetes mellitus with other circulatory complications: Secondary | ICD-10-CM | POA: Diagnosis not present

## 2024-01-20 DIAGNOSIS — E1140 Type 2 diabetes mellitus with diabetic neuropathy, unspecified: Secondary | ICD-10-CM | POA: Diagnosis not present

## 2024-01-20 DIAGNOSIS — Z23 Encounter for immunization: Secondary | ICD-10-CM | POA: Diagnosis not present

## 2024-01-20 DIAGNOSIS — R011 Cardiac murmur, unspecified: Secondary | ICD-10-CM

## 2024-01-20 DIAGNOSIS — E119 Type 2 diabetes mellitus without complications: Secondary | ICD-10-CM

## 2024-01-20 DIAGNOSIS — I152 Hypertension secondary to endocrine disorders: Secondary | ICD-10-CM

## 2024-01-20 DIAGNOSIS — Z7985 Long-term (current) use of injectable non-insulin antidiabetic drugs: Secondary | ICD-10-CM

## 2024-01-20 DIAGNOSIS — R809 Proteinuria, unspecified: Secondary | ICD-10-CM | POA: Diagnosis not present

## 2024-01-20 LAB — BAYER DCA HB A1C WAIVED: HB A1C (BAYER DCA - WAIVED): 6.2 % — ABNORMAL HIGH (ref 4.8–5.6)

## 2024-01-20 MED ORDER — PREGABALIN 75 MG PO CAPS: 75.0000 mg | ORAL_CAPSULE | Freq: Two times a day (BID) | ORAL | 2 refills | Status: DC

## 2024-01-20 NOTE — Assessment & Plan Note (Signed)
 Chronic, stable.  BP improving on recheck today, having some pain today.  Will continue ARB (Olmesartan  40 MG daily), was on ACE but having improved cough off of this.  Recommend checking BP at home three mornings a week and documenting for provider + focus on DASH diet.  LABS: CMP.  Could consider changing to Valsartan  in future if ongoing elevations in BP or adding Diltiazem.

## 2024-01-20 NOTE — Assessment & Plan Note (Signed)
 Chronic, ongoing with A1c 6.2% today, trending down  Urine ALB 80 February 2025, taking ARB.  Will continue Ozempic  1 MG a day since recently got a new supply, but plan to change to Mounjaro once completed supply. Continue Metformin  once a day. Mounjaro may offer more weight loss benefit, which would help back pain. Continue daily B12 supplement for low levels in past. Continue Olmesartan  for proteinuria. - Eye and foot exams up to date - Vaccinations up to date - ARB and statin on board

## 2024-01-20 NOTE — Assessment & Plan Note (Signed)
With aortic sclerosis and moderate LVH past echo.  Asymptomatic at this time.  Consider cardiology referral if any worsening heart function.

## 2024-01-20 NOTE — Assessment & Plan Note (Signed)
 Chronic, ongoing.  Continue current medication regimen and adjust as needed. Lipid panel today.

## 2024-01-20 NOTE — Assessment & Plan Note (Signed)
 Chronic, ongoing with A1c 6.2% today, trending down  Urine ALB 80 February 2025, taking ARB.  Will continue Ozempic  1 MG a day since recently got a new supply, but plan to change to Mounjaro once completed supply. Continue Metformin  once a day. Mounjaro may offer more weight loss benefit, which would help back pain. Continue daily B12 supplement for low levels in past. Will reduce Lyrica  back to 75 MG BID, 100 MG causing too much fatigue.  Recommend she apply Voltaren  gel and Magnesium cream to legs as needed. Discussed getting her back into physiatry for discussion on injections, she wishes to hold off on this.  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 - Eye and foot exams up to date - Vaccinations up to date - ARB and statin on board

## 2024-01-20 NOTE — Progress Notes (Signed)
 BP 136/84 (BP Location: Left Arm, Patient Position: Sitting, Cuff Size: Large)   Pulse 74   Temp 97.9 F (36.6 C) (Oral)   Resp 16   Ht 5' (1.524 m)   Wt 261 lb 6.4 oz (118.6 kg)   SpO2 96%   BMI 51.05 kg/m    Subjective:    Patient ID: Jillian Pierce, female    DOB: 1955-10-26, 68 y.o.   MRN: [id number]  HPI: Jillian Pierce is a 68 y.o. female  Chief Complaint  Patient presents with   Diabetes    Once in the am. Am fasting 98-140. No recent high/lows.    HTN/HLD    No home checks.    Diabetic neuropathy    Left>right and only able to take the Lyrica  once a day as it makes her sleepy.    Reduce Lyrica   DIABETES June A1c 6.4%. Continues Metformin  250 MG BID + Ozempic  weekly 1 MG.  Is tolerating Ozempic . No current weight loss noted. Has been going to [facility] for over a month, yoga and chair exercises. Hypoglycemic episodes:no Polydipsia/polyuria: no Visual disturbance: no Chest pain: no Paresthesias: no Glucose Monitoring: no             Accucheck frequency: every morning             Fasting glucose: 98 to 140 -- 120 this morning             Post prandial:             Evening:             Before meals: Taking Insulin?: no             Long acting insulin:             Short acting insulin: Blood Pressure Monitoring: not checking Retinal Examination: Up to Date -- [facility] Foot Exam: Up to Date Pneumovax: Up to Date Influenza: Provided today Aspirin : yes   NEUROPATHY Taking Lyrica  100 MG BID, which started recent visit. Took Gabapentin  in past, but offered no benefit. Has edema, cannot wear compression at this time due to discomfort.  Does have bilateral hip pain and known degenerative changes lumbar spine. Lyrica  is helping legs but increased dose is causing more fatigue. States legs are a whole lot better. When stands for long period has more numbness and back discomfort at baseline. Has seen physiatry and done physical therapy. Neuropathy status:  improving Satisfied with current treatment?: yes Medication side effects: no Medication compliance:  good compliance Location: as above Pain: yes Severity: 7/10 to left leg mainly Quality: numbness Frequency: constant Bilateral: yes Symmetric: yes Numbness: yes Decreased sensation: yes Weakness: no Context: stable Alleviating factors: Tylenol  Aggravating factors: nothing Treatments attempted: Tylenol , Lyrica , Voltaren  gel, and Gabapentin    HYPERTENSION / HYPERLIPIDEMIA Is taking Lipitor, Olmesartan , Clonidine , and Lasix . Echo on 09/26/21 noted EF 60-65% and moderate LVH with mild aortic valve regurgitation.    HISTORY: Amlodipine  caused leg swelling in the past and currently is noting swelling.  Lisinopril , caused a nagging cough and this was stopped.   Satisfied with current treatment? yes Duration of hypertension: chronic BP monitoring frequency: not checking BP range:  BP medication side effects: no Duration of hyperlipidemia: chronic Cholesterol medication side effects: no Cholesterol supplements: none Medication compliance: good compliance Aspirin : yes Recent stressors: no Recurrent headaches: no Visual changes: no Palpitations: no Dyspnea: with activity on occasion Chest pain: no Lower extremity edema: occasionally, at baseline  Dizzy/lightheaded: no     01/20/2024    1:25 PM 12/16/2023    9:48 AM 09/26/2023    8:53 AM 07/08/2023    9:26 AM 06/23/2023    9:29 AM  Depression screen PHQ 2/9  Decreased Interest 0 0 0 0 0  Down, Depressed, Hopeless 0 0 0 0 0  PHQ - 2 Score 0 0 0 0 0  Altered sleeping 1 1 1  0 1  Tired, decreased energy 1 1 1  0 1  Change in appetite 0 0 0 0 0  Feeling bad or failure about yourself  0 0 0 0 0  Trouble concentrating 0 0 0 0 0  Moving slowly or fidgety/restless 0 0 0 0 0  Suicidal thoughts 0 0 0 0 0  PHQ-9 Score 2 2 2  0 2  Difficult doing work/chores  Not difficult at all Not difficult at all Not difficult at all Not difficult at all        01/20/2024    1:25 PM 12/16/2023    9:48 AM 09/26/2023    8:54 AM 06/23/2023    9:29 AM  GAD 7 : Generalized Anxiety Score  Nervous, Anxious, on Edge 0 0 0 0  Control/stop worrying 0 0 0 0  Worry too much - different things 0 0 0 0  Trouble relaxing 0 0 1 0  Restless 0 0 0 0  Easily annoyed or irritable 0 0 0 0  Afraid - awful might happen 0 0 0 0  Total GAD 7 Score 0 0 1 0  Anxiety Difficulty  Not difficult at all Not difficult at all Not difficult at all   Relevant past medical, surgical, family and social history reviewed and updated as indicated. Interim medical history since our last visit reviewed. Allergies and medications reviewed and updated.  Review of Systems  Constitutional:  Negative for activity change, appetite change, diaphoresis, fatigue and fever.  Respiratory:  Positive for shortness of breath (at baseline, no worsening). Negative for cough, chest tightness and wheezing.   Cardiovascular:  Positive for leg swelling (at baseline). Negative for chest pain and palpitations.  Gastrointestinal: Negative.   Endocrine: Negative for cold intolerance, heat intolerance, polydipsia, polyphagia and polyuria.  Musculoskeletal:  Positive for arthralgias.  Neurological: Negative.   Psychiatric/Behavioral: Negative.     Per HPI unless specifically indicated above     Objective:    BP 136/84 (BP Location: Left Arm, Patient Position: Sitting, Cuff Size: Large)   Pulse 74   Temp 97.9 F (36.6 C) (Oral)   Resp 16   Ht 5' (1.524 m)   Wt 261 lb 6.4 oz (118.6 kg)   SpO2 96%   BMI 51.05 kg/m   Wt Readings from Last 3 Encounters:  01/20/24 261 lb 6.4 oz (118.6 kg)  12/16/23 258 lb 6.4 oz (117.2 kg)  10/30/23 255 lb 9.6 oz (115.9 kg)    Physical Exam Vitals and nursing note reviewed.  Constitutional:      General: She is awake. She is not in acute distress.    Appearance: She is well-developed and well-groomed. She is obese. She is not ill-appearing or  toxic-appearing.  HENT:     Head: Normocephalic.     Right Ear: Hearing and external ear normal.     Left Ear: Hearing and external ear normal.  Eyes:     General: Lids are normal.        Right eye: No discharge.  Left eye: No discharge.     Conjunctiva/sclera: Conjunctivae normal.     Pupils: Pupils are equal, round, and reactive to light.  Neck:     Thyroid : No thyromegaly.     Vascular: No carotid bruit.  Cardiovascular:     Rate and Rhythm: Normal rate and regular rhythm.     Heart sounds: Murmur heard.     Systolic murmur is present with a grade of 2/6.     No gallop.  Pulmonary:     Effort: Pulmonary effort is normal. No accessory muscle usage or respiratory distress.     Breath sounds: Normal breath sounds.  Abdominal:     General: Bowel sounds are normal. There is no distension.     Palpations: Abdomen is soft.     Tenderness: There is no abdominal tenderness.  Musculoskeletal:     Cervical back: Normal range of motion and neck supple.     Right lower leg: 1+ Edema present.     Left lower leg: 1+ Edema present.  Lymphadenopathy:     Cervical: No cervical adenopathy.  Skin:    General: Skin is warm and dry.  Neurological:     Mental Status: She is alert and oriented to person, place, and time.     Cranial Nerves: Cranial nerves 2-12 are intact.     Deep Tendon Reflexes: Reflexes are normal and symmetric.     Reflex Scores:      Brachioradialis reflexes are 2+ on the right side and 2+ on the left side.      Patellar reflexes are 2+ on the right side and 2+ on the left side. Psychiatric:        Attention and Perception: Attention normal.        Mood and Affect: Mood normal.        Speech: Speech normal.        Behavior: Behavior normal. Behavior is cooperative.        Thought Content: Thought content normal.    Results for orders placed or performed in visit on 01/20/24  Bayer DCA Hb A1c Waived   Collection Time: 01/20/24  1:30 PM  Result Value Ref Range    HB A1C (BAYER DCA - WAIVED) 6.2 (H) 4.8 - 5.6 %      Assessment & Plan:   Problem List Items Addressed This Visit       Cardiovascular and Mediastinum   Hypertension associated with diabetes (HCC)   Chronic, stable.  BP improving on recheck today, having some pain today.  Will continue ARB (Olmesartan  40 MG daily), was on ACE but having improved cough off of this.  Recommend checking BP at home three mornings a week and documenting for provider + focus on DASH diet.  LABS: CMP.  Could consider changing to Valsartan  in future if ongoing elevations in BP or adding Diltiazem.      Relevant Orders   Bayer DCA Hb A1c Waived (Completed)     Endocrine   Hyperlipidemia associated with type 2 diabetes mellitus (HCC)   Chronic, ongoing.  Continue current medication regimen and adjust as needed.  Lipid panel today.        Relevant Orders   Bayer DCA Hb A1c Waived (Completed)   Comprehensive metabolic panel with GFR   Lipid Panel w/o Chol/HDL Ratio   Diabetic neuropathy, painful (HCC)   Chronic, ongoing with A1c 6.2% today, trending down  Urine ALB 80 February 2025, taking ARB.  Will continue Ozempic  1 MG  a day since recently got a new supply, but plan to change to Mounjaro once completed supply. Continue Metformin  once a day. Mounjaro may offer more weight loss benefit, which would help back pain. Continue daily B12 supplement for low levels in past. Will reduce Lyrica  back to 75 MG BID, 100 MG causing too much fatigue.  Recommend she apply Voltaren  gel and Magnesium cream to legs as needed. Discussed getting her back into physiatry for discussion on injections, she wishes to hold off on this.  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  Will continue Ozempic  1 MG a day since recently got a new supply, but plan to change to Mounjaro once completed supply. Continue Metformin  once a day. Mounjaro may offer more weight loss benefit, which would help back pain. Continue daily B12 supplement for low levels in past. Continue Olmesartan  for proteinuria. - Eye and foot exams up to date - Vaccinations up to date - ARB and statin on board      Relevant Orders   Bayer DCA Hb A1c Waived (Completed)   Diabetes mellitus treated with injections of non-insulin medication (HCC)   Refer to diabetes with proteinuria plan of care.      Relevant Orders   Bayer DCA Hb A1c Waived (Completed)     Other   Morbid obesity (HCC)   BMI 51.05, with T2DM and HTN/HLD.  Recommended eating smaller high protein, low fat meals more frequently and exercising 30 mins a day 5 times a week with a goal of 10-15lb weight loss in the next 3 months. Patient voiced their understanding and motivation to adhere to these recommendations.       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  Asymptomatic at this time.  Consider cardiology referral if any worsening heart function.      Other Visit Diagnoses       Flu vaccine need       Flu vaccine today, educated patient.   Relevant Orders   Flu vaccine HIGH DOSE PF(Fluzone Trivalent) (Completed)        Follow up plan: Return in about 3 months (around 04/20/2024) for T2DM, HTN/HLD.

## 2024-01-20 NOTE — Assessment & Plan Note (Signed)
 Refer to diabetes with proteinuria plan of care.

## 2024-01-20 NOTE — Assessment & Plan Note (Signed)
 BMI 51.05, with T2DM and HTN/HLD.  Recommended eating smaller high protein, low fat meals more frequently and exercising 30 mins a day 5 times a week with a goal of 10-15lb weight loss in the next 3 months. Patient voiced their understanding and motivation to adhere to these recommendations.

## 2024-01-21 ENCOUNTER — Ambulatory Visit: Payer: Self-pay | Admitting: Nurse Practitioner

## 2024-01-21 LAB — COMPREHENSIVE METABOLIC PANEL WITH GFR
ALT: 10 IU/L (ref 0–32)
AST: 16 IU/L (ref 0–40)
Albumin: 4 g/dL (ref 3.9–4.9)
Alkaline Phosphatase: 86 IU/L (ref 49–135)
BUN/Creatinine Ratio: 13 (ref 12–28)
BUN: 13 mg/dL (ref 8–27)
Bilirubin Total: 0.3 mg/dL (ref 0.0–1.2)
CO2: 22 mmol/L (ref 20–29)
Calcium: 9.1 mg/dL (ref 8.7–10.3)
Chloride: 104 mmol/L (ref 96–106)
Creatinine, Ser: 1 mg/dL (ref 0.57–1.00)
Globulin, Total: 2.7 g/dL (ref 1.5–4.5)
Glucose: 124 mg/dL — ABNORMAL HIGH (ref 70–99)
Potassium: 4 mmol/L (ref 3.5–5.2)
Sodium: 141 mmol/L (ref 134–144)
Total Protein: 6.7 g/dL (ref 6.0–8.5)
eGFR: 61 mL/min/1.73 (ref >59)

## 2024-01-21 LAB — LIPID PANEL W/O CHOL/HDL RATIO
Cholesterol, Total: 118 mg/dL (ref 100–199)
HDL: 44 mg/dL (ref >39)
LDL Chol Calc (NIH): 52 mg/dL (ref 0–99)
Triglycerides: 121 mg/dL (ref 0–149)
VLDL Cholesterol Cal: 22 mg/dL (ref 5–40)

## 2024-04-17 NOTE — Patient Instructions (Signed)

## 2024-04-21 ENCOUNTER — Encounter: Payer: Self-pay | Admitting: Nurse Practitioner

## 2024-04-21 ENCOUNTER — Ambulatory Visit: Admitting: Nurse Practitioner

## 2024-04-21 VITALS — BP 134/78 | HR 73 | Temp 97.9°F | Resp 20 | Ht 60.0 in | Wt 246.0 lb

## 2024-04-21 DIAGNOSIS — Z0000 Encounter for general adult medical examination without abnormal findings: Secondary | ICD-10-CM

## 2024-04-21 DIAGNOSIS — E538 Deficiency of other specified B group vitamins: Secondary | ICD-10-CM | POA: Diagnosis not present

## 2024-04-21 DIAGNOSIS — E1140 Type 2 diabetes mellitus with diabetic neuropathy, unspecified: Secondary | ICD-10-CM

## 2024-04-21 DIAGNOSIS — R011 Cardiac murmur, unspecified: Secondary | ICD-10-CM | POA: Diagnosis not present

## 2024-04-21 DIAGNOSIS — S91002A Unspecified open wound, left ankle, initial encounter: Secondary | ICD-10-CM | POA: Diagnosis not present

## 2024-04-21 DIAGNOSIS — R809 Proteinuria, unspecified: Secondary | ICD-10-CM | POA: Diagnosis not present

## 2024-04-21 DIAGNOSIS — E119 Type 2 diabetes mellitus without complications: Secondary | ICD-10-CM

## 2024-04-21 DIAGNOSIS — M85852 Other specified disorders of bone density and structure, left thigh: Secondary | ICD-10-CM

## 2024-04-21 DIAGNOSIS — E1169 Type 2 diabetes mellitus with other specified complication: Secondary | ICD-10-CM

## 2024-04-21 DIAGNOSIS — E1129 Type 2 diabetes mellitus with other diabetic kidney complication: Secondary | ICD-10-CM

## 2024-04-21 DIAGNOSIS — E559 Vitamin D deficiency, unspecified: Secondary | ICD-10-CM

## 2024-04-21 DIAGNOSIS — R600 Localized edema: Secondary | ICD-10-CM | POA: Diagnosis not present

## 2024-04-21 DIAGNOSIS — I152 Hypertension secondary to endocrine disorders: Secondary | ICD-10-CM

## 2024-04-21 DIAGNOSIS — Z7985 Long-term (current) use of injectable non-insulin antidiabetic drugs: Secondary | ICD-10-CM

## 2024-04-21 DIAGNOSIS — E1159 Type 2 diabetes mellitus with other circulatory complications: Secondary | ICD-10-CM

## 2024-04-21 LAB — BAYER DCA HB A1C WAIVED: HB A1C (BAYER DCA - WAIVED): 6.2 % — ABNORMAL HIGH (ref 4.8–5.6)

## 2024-04-21 MED ORDER — PREGABALIN 100 MG PO CAPS: 100.0000 mg | ORAL_CAPSULE | Freq: Two times a day (BID) | ORAL | 5 refills | Status: AC

## 2024-04-21 MED ORDER — ATORVASTATIN CALCIUM 10 MG PO TABS: 10.0000 mg | ORAL_TABLET | Freq: Every day | ORAL | 4 refills | Status: AC

## 2024-04-21 MED ORDER — FLUTICASONE PROPIONATE 50 MCG/ACT NA SUSP: 2.0000 | Freq: Every day | NASAL | 4 refills | Status: AC

## 2024-04-21 MED ORDER — FUROSEMIDE 20 MG PO TABS: 20.0000 mg | ORAL_TABLET | Freq: Every day | ORAL | 4 refills | Status: AC

## 2024-04-21 MED ORDER — OLMESARTAN MEDOXOMIL 40 MG PO TABS: 40.0000 mg | ORAL_TABLET | Freq: Every day | ORAL | 3 refills | Status: AC

## 2024-04-21 MED ORDER — CLONIDINE HCL 0.3 MG PO TABS: 0.3000 mg | ORAL_TABLET | Freq: Two times a day (BID) | ORAL | 4 refills | Status: AC

## 2024-04-21 MED ORDER — SEMAGLUTIDE (2 MG/DOSE) 8 MG/3ML ~~LOC~~ SOPN: 2.0000 mg | PEN_INJECTOR | SUBCUTANEOUS | 3 refills | Status: AC

## 2024-04-21 MED ORDER — METFORMIN HCL 500 MG PO TABS: ORAL_TABLET | ORAL | 4 refills | Status: AC

## 2024-04-21 NOTE — Assessment & Plan Note (Signed)
 BMI 48.04, with T2DM and HTN/HLD. Has lost 15 lbs, praised for this. Recommended eating smaller high protein, low fat meals more frequently and exercising 30 mins a day 5 times a week with a goal of 10-15lb weight loss in the next 3 months. Patient voiced their understanding and motivation to adhere to these recommendations.

## 2024-04-21 NOTE — Assessment & Plan Note (Signed)
 Chronic, stable.  BP improving on recheck today, having some pain today.  Will continue ARB (Olmesartan  40 MG daily), was on ACE but having improved cough off of this.  Recommend checking BP at home three mornings a week and documenting for provider + focus on DASH diet.  LABS: CBC, TSH, CMP.  Could consider changing to Valsartan  in future if elevations in BP or adding Diltiazem.

## 2024-04-21 NOTE — Assessment & Plan Note (Signed)
 Chronic, ongoing with A1c 6.2% today, remaining stable  Urine ALB 80 February 2025, taking ARB.  Will increase Ozempic  to 2 MG weekly which may offer continued weight loss benefit. Continue Metformin  once a day. Mounjaro may offer more weight loss benefit, which would help back pain - could change to this in future. Continue daily B12 supplement for low levels in past. Will try to increase Lyrica  to 100 MG again, but if increase fatigue presents reduce back down.  Recommend she apply Voltaren  gel and Magnesium cream to legs as needed. Discussed getting her back into physiatry for discussion on injections, she wishes to hold off on this.  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 She agrees with referral to neurology for further testing. Referral to podiatry placed for healing wound on medial left ankle. - Eye and foot exams up to date - Vaccinations up to date - ARB and statin on board

## 2024-04-21 NOTE — Assessment & Plan Note (Signed)
 Noted on foot exam today, will get into podiatry for further recommendations. Appears to be healing, but would like podiatry check.

## 2024-04-21 NOTE — Assessment & Plan Note (Signed)
Ongoing and stable.  Vitamin B12 checked today and continue supplement.

## 2024-04-21 NOTE — Assessment & Plan Note (Signed)
With aortic sclerosis and moderate LVH past echo.  Asymptomatic at this time.  Consider cardiology referral if any worsening heart function.

## 2024-04-21 NOTE — Assessment & Plan Note (Signed)
 Ongoing, slightly improved at present.  ?lymphedema.  Recommend compression hose on during day and off at night. Continue off Gabapentin  and Amlodipine .

## 2024-04-21 NOTE — Assessment & Plan Note (Signed)
 Refer to diabetes with proteinuria plan of care.

## 2024-04-21 NOTE — Assessment & Plan Note (Signed)
 Chronic, ongoing.  Continue current medication regimen and adjust as needed. Lipid panel today.

## 2024-04-21 NOTE — Assessment & Plan Note (Addendum)
 Noted on DEXA 01/03/21 == continue Vitamin D  supplement daily.  Recheck DEXA in 2027. Check Vit D level today.

## 2024-04-21 NOTE — Progress Notes (Signed)
 "  BP 134/78 (BP Location: Left Arm, Patient Position: Sitting, Cuff Size: Large)   Pulse 73   Temp 97.9 F (36.6 C) (Oral)   Resp 20   Ht 5' (1.524 m)   Wt 246 lb (111.6 kg)   SpO2 96%   BMI 48.04 kg/m    Subjective:    Patient ID: Jillian Pierce, female    DOB: 1955-10-02, 68 y.o.   MRN: [id number]  HPI: Jillian Pierce is a 68 y.o. female  Chief Complaint  Patient presents with   Follow-up    Here for routine check up, no major concerns   DIABETES September A1c 6.2%. Taking Metformin  250 MG BID + Ozempic  weekly 1 MG. Has lost 15 lbs since last visit. Been going to [facility] for over a month, yoga and chair exercises. Goes about 3 days a week, has not been going for a month due to home issues.  Continues to take Lyrica  for neuropathy pain, currently 75 MG BID. This was started in July 2025. She reports the neuropathy is getting worse, is not sleeping at night.  Pain is worse at night, uses magnesium cream at night. Takes B12 and Vitamin D  daily for past low levels. No falls at home. Hypoglycemic episodes:no Polydipsia/polyuria: no Visual disturbance: no Chest pain: no Paresthesias: no Glucose Monitoring: no             Accucheck frequency: every morning             Fasting glucose: 114 this morning, ranges 103 to 115 range             Post prandial:             Evening:             Before meals: Taking Insulin?: no             Long acting insulin:             Short acting insulin: Blood Pressure Monitoring: not checking Retinal Examination: Up to Date -- [facility] Foot Exam: Up to Date Pneumovax: Up to Date Influenza: Up To Date Aspirin : yes   HYPERTENSION / HYPERLIPIDEMIA Taking Lipitor, Olmesartan , Clonidine , and Lasix . Last echo performed on 09/26/21 noted EF 60-65% and moderate LVH with mild aortic valve regurgitation.    HISTORY: Amlodipine  caused leg swelling in the past. Lisinopril , caused a nagging cough and this was stopped.   Satisfied with current  treatment? yes Duration of hypertension: chronic BP monitoring frequency: not checking BP range:  BP medication side effects: no Duration of hyperlipidemia: chronic Cholesterol medication side effects: no Cholesterol supplements: none Medication compliance: good compliance Aspirin : yes Recent stressors: no Recurrent headaches: no Visual changes: no Palpitations: no Dyspnea: sometimes with activity Chest pain: no Lower extremity edema: at baseline, no worsening Dizzy/lightheaded: no  OSTEOPENIA Noted on imaging 01/03/21.  Satisfied with current treatment?: yes Adequate calcium  & vitamin D : yes Weight bearing exercises: yes      04/21/2024   10:34 AM 01/20/2024    1:25 PM 12/16/2023    9:48 AM 09/26/2023    8:53 AM 07/08/2023    9:26 AM  Depression screen PHQ 2/9  Decreased Interest 0 0 0 0 0  Down, Depressed, Hopeless 0 0 0 0 0  PHQ - 2 Score 0 0 0 0 0  Altered sleeping 1 1 1 1  0  Tired, decreased energy 1 1 1 1  0  Change in appetite 0 0 0 0  0  Feeling bad or failure about yourself  0 0 0 0 0  Trouble concentrating 0 0 0 0 0  Moving slowly or fidgety/restless 0 0 0 0 0  Suicidal thoughts 0 0 0 0 0  PHQ-9 Score 2 2  2  2   0   Difficult doing work/chores   Not difficult at all Not difficult at all Not difficult at all     Data saved with a previous flowsheet row definition       04/21/2024   10:34 AM 01/20/2024    1:25 PM 12/16/2023    9:48 AM 09/26/2023    8:54 AM  GAD 7 : Generalized Anxiety Score  Nervous, Anxious, on Edge 0 0 0 0  Control/stop worrying 0 0 0 0  Worry too much - different things 0 0 0 0  Trouble relaxing 1 0 0 1  Restless 0 0 0 0  Easily annoyed or irritable 0 0 0 0  Afraid - awful might happen 0 0 0 0  Total GAD 7 Score 1 0 0 1  Anxiety Difficulty   Not difficult at all Not difficult at all   Relevant past medical, surgical, family and social history reviewed and updated as indicated. Interim medical history since our last visit  reviewed. Allergies and medications reviewed and updated.  Review of Systems  Constitutional:  Negative for activity change, appetite change, diaphoresis, fatigue and fever.  Respiratory:  Positive for shortness of breath (at baseline, no worsening). Negative for cough, chest tightness and wheezing.   Cardiovascular:  Positive for leg swelling (at baseline). Negative for chest pain and palpitations.  Gastrointestinal: Negative.   Endocrine: Negative for cold intolerance, heat intolerance, polydipsia, polyphagia and polyuria.  Neurological:  Positive for numbness. Negative for dizziness, seizures, facial asymmetry, weakness and headaches.  Psychiatric/Behavioral: Negative.     Per HPI unless specifically indicated above     Objective:    BP 134/78 (BP Location: Left Arm, Patient Position: Sitting, Cuff Size: Large)   Pulse 73   Temp 97.9 F (36.6 C) (Oral)   Resp 20   Ht 5' (1.524 m)   Wt 246 lb (111.6 kg)   SpO2 96%   BMI 48.04 kg/m   Wt Readings from Last 3 Encounters:  04/21/24 246 lb (111.6 kg)  01/20/24 261 lb 6.4 oz (118.6 kg)  12/16/23 258 lb 6.4 oz (117.2 kg)    Physical Exam Vitals and nursing note reviewed.  Constitutional:      General: She is awake. She is not in acute distress.    Appearance: She is well-developed and well-groomed. She is obese. She is not ill-appearing or toxic-appearing.  HENT:     Head: Normocephalic.     Right Ear: Hearing and external ear normal.     Left Ear: Hearing and external ear normal.  Eyes:     General: Lids are normal.        Right eye: No discharge.        Left eye: No discharge.     Conjunctiva/sclera: Conjunctivae normal.     Pupils: Pupils are equal, round, and reactive to light.  Neck:     Thyroid : No thyromegaly.     Vascular: No carotid bruit.  Cardiovascular:     Rate and Rhythm: Normal rate and regular rhythm.     Pulses:          Dorsalis pedis pulses are 2+ on the right side and 2+ on the left side.  Posterior tibial pulses are 2+ on the right side and 2+ on the left side.     Heart sounds: Murmur heard.     Systolic murmur is present with a grade of 2/6.     No gallop.  Pulmonary:     Effort: Pulmonary effort is normal. No accessory muscle usage or respiratory distress.     Breath sounds: Normal breath sounds.  Abdominal:     General: Bowel sounds are normal. There is no distension.     Palpations: Abdomen is soft.     Tenderness: There is no abdominal tenderness.  Musculoskeletal:     Cervical back: Normal range of motion and neck supple.     Right lower leg: 1+ Edema present.     Left lower leg: 1+ Edema present.     Right foot: Normal range of motion.     Left foot: Normal range of motion.  Feet:     Right foot:     Protective Sensation: 10 sites tested.  8 sites sensed.     Skin integrity: Dry skin present.     Toenail Condition: Right toenails are abnormally thick.     Left foot:     Protective Sensation: 10 sites tested.  7 sites sensed.     Skin integrity: Dry skin present.     Toenail Condition: Left toenails are abnormally thick.     Comments: To left posterior ankle, at site of scar tissue from past surgery, there is a small, round, approx 3 cm wound that is crusted over. No drainage, redness, warmth. Mildly tender. Lymphadenopathy:     Cervical: No cervical adenopathy.  Skin:    General: Skin is warm and dry.  Neurological:     Mental Status: She is alert and oriented to person, place, and time.     Cranial Nerves: Cranial nerves 2-12 are intact.     Deep Tendon Reflexes: Reflexes are normal and symmetric.     Reflex Scores:      Brachioradialis reflexes are 2+ on the right side and 2+ on the left side.      Patellar reflexes are 2+ on the right side and 2+ on the left side. Psychiatric:        Attention and Perception: Attention normal.        Mood and Affect: Mood normal.        Speech: Speech normal.        Behavior: Behavior normal. Behavior is  cooperative.        Thought Content: Thought content normal.    Results for orders placed or performed in visit on 01/20/24  Bayer DCA Hb A1c Waived   Collection Time: 01/20/24  1:30 PM  Result Value Ref Range   HB A1C (BAYER DCA - WAIVED) 6.2 (H) 4.8 - 5.6 %  Comprehensive metabolic panel with GFR   Collection Time: 01/20/24  1:30 PM  Result Value Ref Range   Glucose 124 (H) 70 - 99 mg/dL   BUN 13 8 - 27 mg/dL   Creatinine, Ser 8.99 0.57 - 1.00 mg/dL   eGFR 61 >40 fO/fpw/8.26   BUN/Creatinine Ratio 13 12 - 28   Sodium 141 134 - 144 mmol/L   Potassium 4.0 3.5 - 5.2 mmol/L   Chloride 104 96 - 106 mmol/L   CO2 22 20 - 29 mmol/L   Calcium  9.1 8.7 - 10.3 mg/dL   Total Protein 6.7 6.0 - 8.5 g/dL   Albumin 4.0 3.9 - 4.9  g/dL   Globulin, Total 2.7 1.5 - 4.5 g/dL   Bilirubin Total 0.3 0.0 - 1.2 mg/dL   Alkaline Phosphatase 86 49 - 135 IU/L   AST 16 0 - 40 IU/L   ALT 10 0 - 32 IU/L  Lipid Panel w/o Chol/HDL Ratio   Collection Time: 01/20/24  1:30 PM  Result Value Ref Range   Cholesterol, Total 118 100 - 199 mg/dL   Triglycerides 878 0 - 149 mg/dL   HDL 44 >60 mg/dL   VLDL Cholesterol Cal 22 5 - 40 mg/dL   LDL Chol Calc (NIH) 52 0 - 99 mg/dL      Assessment & Plan:   Problem List Items Addressed This Visit       Cardiovascular and Mediastinum   Hypertension associated with diabetes (HCC)   Chronic, stable.  BP improving on recheck today, having some pain today.  Will continue ARB (Olmesartan  40 MG daily), was on ACE but having improved cough off of this.  Recommend checking BP at home three mornings a week and documenting for provider + focus on DASH diet.  LABS: CBC, TSH, CMP.  Could consider changing to Valsartan  in future if elevations in BP or adding Diltiazem.      Relevant Medications   Semaglutide , 2 MG/DOSE, 8 MG/3ML SOPN   atorvastatin  (LIPITOR) 10 MG tablet   cloNIDine  (CATAPRES ) 0.3 MG tablet   furosemide  (LASIX ) 20 MG tablet   metFORMIN  (GLUCOPHAGE ) 500 MG  tablet   olmesartan  (BENICAR ) 40 MG tablet   Other Relevant Orders   Bayer DCA Hb A1c Waived   CBC with Differential/Platelet   Comprehensive metabolic panel with GFR   TSH     Endocrine   Hyperlipidemia associated with type 2 diabetes mellitus (HCC)   Chronic, ongoing.  Continue current medication regimen and adjust as needed.  Lipid panel today.        Relevant Medications   Semaglutide , 2 MG/DOSE, 8 MG/3ML SOPN   atorvastatin  (LIPITOR) 10 MG tablet   cloNIDine  (CATAPRES ) 0.3 MG tablet   furosemide  (LASIX ) 20 MG tablet   metFORMIN  (GLUCOPHAGE ) 500 MG tablet   olmesartan  (BENICAR ) 40 MG tablet   Other Relevant Orders   Bayer DCA Hb A1c Waived   Comprehensive metabolic panel with GFR   Lipid Panel w/o Chol/HDL Ratio   Diabetic neuropathy, painful (HCC) - Primary   Chronic, ongoing with A1c 6.2% today, remaining stable  Urine ALB 80 February 2025, taking ARB.  Will increase Ozempic  to 2 MG weekly which may offer continued weight loss benefit. Continue Metformin  once a day. Mounjaro may offer more weight loss benefit, which would help back pain - could change to this in future. Continue daily B12 supplement for low levels in past. Will try to increase Lyrica  to 100 MG again, but if increase fatigue presents reduce back down.  Recommend she apply Voltaren  gel and Magnesium cream to legs as needed. Discussed getting her back into physiatry for discussion on injections, she wishes to hold off on this.  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 She agrees with referral to neurology for further testing. Referral to podiatry placed for healing wound on medial left ankle. - Eye and foot exams up to date - Vaccinations up to date - ARB and statin on board      Relevant Medications   Semaglutide , 2 MG/DOSE, 8 MG/3ML SOPN   atorvastatin  (LIPITOR) 10 MG tablet   cloNIDine  (CATAPRES ) 0.3 MG tablet  furosemide  (LASIX ) 20 MG tablet   metFORMIN  (GLUCOPHAGE ) 500 MG  tablet   olmesartan  (BENICAR ) 40 MG tablet   Other Relevant Orders   Bayer DCA Hb A1c Waived   Ambulatory referral to Neurology   Ambulatory referral to Podiatry   Diabetes mellitus with proteinuria (HCC)   Chronic, ongoing with A1c 6.2% today, remaining stable  Urine ALB 80 February 2025, taking ARB.  Will trial increasing Ozempic  to 2 MG weekly. Continue Metformin  once a day. Mounjaro may offer more weight loss benefit, which would help back pain -- could change to this in future. Continue daily B12 supplement for low levels in past. Continue Olmesartan  for proteinuria. - Eye and foot exams up to date - Vaccinations up to date - ARB and statin on board      Relevant Medications   Semaglutide , 2 MG/DOSE, 8 MG/3ML SOPN   atorvastatin  (LIPITOR) 10 MG tablet   metFORMIN  (GLUCOPHAGE ) 500 MG tablet   olmesartan  (BENICAR ) 40 MG tablet   Other Relevant Orders   Bayer DCA Hb A1c Waived   Diabetes mellitus treated with injections of non-insulin medication (HCC)   Refer to diabetes with proteinuria plan of care.      Relevant Medications   Semaglutide , 2 MG/DOSE, 8 MG/3ML SOPN   atorvastatin  (LIPITOR) 10 MG tablet   metFORMIN  (GLUCOPHAGE ) 500 MG tablet   olmesartan  (BENICAR ) 40 MG tablet   Other Relevant Orders   Bayer DCA Hb A1c Waived     Musculoskeletal and Integument   Osteopenia of neck of left femur   Noted on DEXA 01/03/21 == continue Vitamin D  supplement daily.  Recheck DEXA in 2027. Check Vit D level today.      Relevant Orders   VITAMIN D  25 Hydroxy (Vit-D Deficiency, Fractures)     Other   Vitamin B12 deficiency   Ongoing and stable.  Vitamin B12 checked today and continue supplement.      Relevant Orders   Vitamin B12   Wound of left ankle   Noted on foot exam today, will get into podiatry for further recommendations. Appears to be healing, but would like podiatry check.      Relevant Orders   Ambulatory referral to Podiatry   Morbid obesity (HCC)   BMI 48.04,  with T2DM and HTN/HLD. Has lost 15 lbs, praised for this. Recommended eating smaller high protein, low fat meals more frequently and exercising 30 mins a day 5 times a week with a goal of 10-15lb weight loss in the next 3 months. Patient voiced their understanding and motivation to adhere to these recommendations.       Relevant Medications   Semaglutide , 2 MG/DOSE, 8 MG/3ML SOPN   metFORMIN  (GLUCOPHAGE ) 500 MG tablet   Heart murmur   With aortic sclerosis and moderate LVH past echo.  Asymptomatic at this time.  Consider cardiology referral if any worsening heart function.      Edema of both legs   Ongoing, slightly improved at present.  ?lymphedema.  Recommend compression hose on during day and off at night. Continue off Gabapentin  and Amlodipine .        Follow up plan: Return in about 4 weeks (around 05/19/2024) for Neuropathy + 3 months for T2DM, HTN/HLD.      "

## 2024-04-21 NOTE — Assessment & Plan Note (Signed)
 Chronic, ongoing with A1c 6.2% today, remaining stable  Urine ALB 80 February 2025, taking ARB.  Will trial increasing Ozempic  to 2 MG weekly. Continue Metformin  once a day. Mounjaro may offer more weight loss benefit, which would help back pain -- could change to this in future. Continue daily B12 supplement for low levels in past. Continue Olmesartan  for proteinuria. - Eye and foot exams up to date - Vaccinations up to date - ARB and statin on board

## 2024-04-22 ENCOUNTER — Ambulatory Visit: Payer: Self-pay | Admitting: Nurse Practitioner

## 2024-04-22 LAB — CBC WITH DIFFERENTIAL/PLATELET
Basophils Absolute: 0 x10E3/uL (ref 0.0–0.2)
Basos: 1 %
EOS (ABSOLUTE): 0.2 x10E3/uL (ref 0.0–0.4)
Eos: 4 %
Hematocrit: 37 % (ref 34.0–46.6)
Hemoglobin: 11.6 g/dL (ref 11.1–15.9)
Immature Grans (Abs): 0 x10E3/uL (ref 0.0–0.1)
Immature Granulocytes: 0 %
Lymphocytes Absolute: 1.5 x10E3/uL (ref 0.7–3.1)
Lymphs: 28 %
MCH: 27.1 pg (ref 26.6–33.0)
MCHC: 31.4 g/dL — ABNORMAL LOW (ref 31.5–35.7)
MCV: 86 fL (ref 79–97)
Monocytes Absolute: 0.5 x10E3/uL (ref 0.1–0.9)
Monocytes: 9 %
Neutrophils Absolute: 3.1 x10E3/uL (ref 1.4–7.0)
Neutrophils: 58 %
Platelets: 433 x10E3/uL (ref 150–450)
RBC: 4.28 x10E6/uL (ref 3.77–5.28)
RDW: 14.4 % (ref 11.7–15.4)
WBC: 5.3 x10E3/uL (ref 3.4–10.8)

## 2024-04-22 LAB — COMPREHENSIVE METABOLIC PANEL WITH GFR
ALT: 12 IU/L (ref 0–32)
AST: 16 IU/L (ref 0–40)
Albumin: 4.3 g/dL (ref 3.9–4.9)
Alkaline Phosphatase: 82 IU/L (ref 49–135)
BUN/Creatinine Ratio: 15 (ref 12–28)
BUN: 13 mg/dL (ref 8–27)
Bilirubin Total: 0.6 mg/dL (ref 0.0–1.2)
CO2: 21 mmol/L (ref 20–29)
Calcium: 9.8 mg/dL (ref 8.7–10.3)
Chloride: 105 mmol/L (ref 96–106)
Creatinine, Ser: 0.85 mg/dL (ref 0.57–1.00)
Globulin, Total: 2.8 g/dL (ref 1.5–4.5)
Glucose: 104 mg/dL — ABNORMAL HIGH (ref 70–99)
Potassium: 3.9 mmol/L (ref 3.5–5.2)
Sodium: 142 mmol/L (ref 134–144)
Total Protein: 7.1 g/dL (ref 6.0–8.5)
eGFR: 74 mL/min/1.73

## 2024-04-22 LAB — LIPID PANEL W/O CHOL/HDL RATIO
Cholesterol, Total: 115 mg/dL (ref 100–199)
HDL: 51 mg/dL
LDL Chol Calc (NIH): 52 mg/dL (ref 0–99)
Triglycerides: 54 mg/dL (ref 0–149)
VLDL Cholesterol Cal: 12 mg/dL (ref 5–40)

## 2024-04-22 LAB — VITAMIN B12: Vitamin B-12: 1811 pg/mL — ABNORMAL HIGH (ref 232–1245)

## 2024-04-22 LAB — TSH: TSH: 1.08 u[IU]/mL (ref 0.450–4.500)

## 2024-04-22 LAB — VITAMIN D 25 HYDROXY (VIT D DEFICIENCY, FRACTURES): Vit D, 25-Hydroxy: 35.2 ng/mL (ref 30.0–100.0)

## 2024-04-22 NOTE — Progress Notes (Signed)
 Contacted via MyChart  Good morning Jillian Pierce, your labs have returned and overall remain stable. Continue current medications and supplements. No changes needed. If the 100 MCG of Lyrica  makes you too tired let me know and we will go back to previous dose, as I believe in past when we did trial an increase it made you tired. We will see how you do with this. Any questions? Keep being amazing!!  Thank you for allowing me to participate in your care.  I appreciate you. Kindest regards, Marquee Fuchs

## 2024-04-29 ENCOUNTER — Telehealth: Payer: Self-pay | Admitting: Nurse Practitioner

## 2024-04-29 NOTE — Telephone Encounter (Signed)
 Copied from CRM [id number]. Topic: General - Other >> Apr 28, 2024  4:56 PM China J wrote: Reason for CRM: Sierra calling from the [facility] to let the provider know that if any patients have [facility] Medicare Advantage plan, that an authorization needs to go through [employer] first before a referral is sent to them.  Originally she had thought the patient had [facility] Medicare Advantage but later realized that she was insurance with [employer].

## 2024-04-29 NOTE — Telephone Encounter (Signed)
 Referral team- can you guys assist with this please?

## 2024-05-15 NOTE — Patient Instructions (Signed)
 Be Involved in Caring For Your Health:  Taking Medications When medications are taken as directed, they can greatly improve your health. But if they are not taken as prescribed, they may not work. In some cases, not taking them correctly can be harmful. To help ensure your treatment remains effective and safe, understand your medications and how to take them. Bring your medications to each visit for review by your provider.  Your lab results, notes, and after visit summary will be available on My Chart. We strongly encourage you to use this feature. If lab results are abnormal the clinic will contact you with the appropriate steps. If the clinic does not contact you assume the results are satisfactory. You can always view your results on My Chart. If you have questions regarding your health or results, please contact the clinic during office hours. You can also ask questions on My Chart.  We at [facility] are grateful that you chose us  to provide your care. We strive to provide evidence-based and compassionate care and are always looking for feedback. If you get a survey from the clinic please complete this so we can hear your opinions.   Nerve Damage (Peripheral Neuropathy): What to Know Peripheral neuropathy happens when there's damage to the peripheral nerves. These nerves send signals between your spinal cord and your arms and legs. You can have damage in one or more nerves. What are the causes? There are many reasons why nerves can get damaged. Damage may be caused by some diseases, such as: Diabetes. This is the most common cause. Autoimmune diseases, such as rheumatoid arthritis or lupus. These happen when your body's defense system (immune system) attacks healthy parts of your body by mistake. Inherited nerve disease. This is passed down in families. Kidney disease. Thyroid disease. Other causes include: Pressure or stress on a nerve that lasts a long time. Lack of B  vitamins from poor diet or drinking too much alcohol. Infections. Some medicines, like chemotherapy used for cancer treatment. Poisonous (toxic) substances, such as lead or mercury. Too little blood flowing to the legs. Sometimes, the cause isn't known. What are the signs or symptoms? Symptoms depend on which nerves are damaged. In your legs, hands, or arms, you may have: Loss of feeling (numbness). Tingling. Burning pain. Very sensitive skin. Weakness. Paralysis. This is when you can't move a part of your body. Clumsiness. Muscle twitching. Loss of balance. You may have trouble walking. Other symptoms can include: Not being able to control when you pee. Feeling dizzy. Problems during sex. How is this diagnosed? Your health care provider will: Ask about your symptoms and medical history. Do a physical exam. Do a neurological exam. They will check your reflexes, how you move, and what you can feel. You may need to have other tests, such as: Blood tests. Nerve tests to check how well your nerves and muscles work. Imaging tests, such as a CT scan or MRI. These are done to rule out other causes. Nerve biopsy. This is when a small piece of a nerve is removed for testing. Lumbar puncture. A small amount of the fluid that surrounds the brain and spinal cord is taken and checked in a lab. How is this treated? Treatment may include: Treating the cause of the nerve damage. Pain medicines. Other medicines that may help with pain, such as: Medicines that treat seizures. Medicines that treat depression. Pain patches or creams that are put on painful areas of skin. TENS therapy. This uses  a device that sends mild electrical pulses to your nerves. This will prevent pain signals from reaching the brain. Massage. Acupuncture. Surgery to relieve pressure on a nerve or to destroy a nerve that's causing pain. This is done only if the other treatments are not helping. You may also need  physical therapy to help improve your movement, balance, and muscle strength. You may be told to use a cane or walker if needed. Follow these instructions at home: Medicines Take your medicines only as told. You may need to take steps to help treat or prevent trouble pooping (constipation), such as: Taking medicines to help you poop. Eating foods high in fiber, like beans, whole grains, and fresh fruits and vegetables. Drinking more fluids as told. Ask your provider if it's safe to drive or use machines while taking your medicine. Lifestyle  Do not smoke, vape, or use nicotine or tobacco. Avoid or limit alcohol. Too much alcohol can be harmful to nerves and cause damage. Eat a healthy diet. Safety  Take care to avoid burning or damaging your skin if you have numbness. If you have numbness in your feet: Check your feet each day for signs of injury or infection. Watch for redness, warmth, and swelling. Wear padded socks and comfortable shoes. These help protect your feet. General instructions If you have diabetes, keep your blood sugar under control. Develop a good support system. Living with nerve damage can cause a lot of stress. Talk with a mental health therapist or join a support group. Exercise as told. Ask what things are safe for you to do at home. Work with a pain specialist to find the best way to manage your pain. Keep all follow-up visits. Your provider will check if the treatments are working and change them if needed. Where to find support The Foundation for Peripheral Neuropathy: budgetmaniac.si Where to find more information To learn more, go to: [employer] of Neurological Disorders and Stroke at basicfm.no. Click Search and type peripheral neuropathy. Find the link you need. Contact a health care provider if: Your pain treatments are not working. Your symptoms are getting worse. You have side effects from any of your  medicines. You have new symptoms. You're having a hard time coping with your condition. Get help right away if: You have a wound that's not healing. You have new weakness in an arm or leg. You've fallen or you fall often. This information is not intended to replace advice given to you by your health care provider. Make sure you discuss any questions you have with your health care provider. Document Revised: 07/03/2023 Document Reviewed: 07/03/2023 Elsevier Patient Education  2025 [employer].

## 2024-05-20 ENCOUNTER — Ambulatory Visit: Payer: Self-pay | Admitting: Podiatry

## 2024-05-20 DIAGNOSIS — G629 Polyneuropathy, unspecified: Secondary | ICD-10-CM | POA: Diagnosis not present

## 2024-05-20 NOTE — Progress Notes (Signed)
 "  Subjective:  Patient ID: Jillian Pierce, female    DOB: 28-Nov-1955,  MRN: [id number]  Chief Complaint  Patient presents with   Numbness    Pt stated that she has neuropathy really bad in her left foot she stated that she had a blister on the back of heel that is really tender.    69 y.o. female presents with the above complaint.  Patient presents with complaint of bilateral neuropathy pain.  She states causing her a lot of discomfort she has been on gabapentin  which has not helped she has been taking vitamin B12 as well.  She is a diabetic.  She denies any other acute complaints pain scale 7 out of 10 dull aching nature hurts with ambulation and shoe pressure no open wounds or lesion   Review of Systems: Negative except as noted in the HPI. Denies N/V/F/Ch.  Past Medical History:  Diagnosis Date   Diabetes mellitus without complication (HCC)    Hypertension    Left knee pain 05/17/2014   Morbid obesity (HCC) 02/15/2016   Current Medications[1]  Tobacco Use History[2]  Allergies[3] Objective:  There were no vitals filed for this visit. There is no height or weight on file to calculate BMI. Constitutional Well developed. Well nourished.  Vascular Dorsalis pedis pulses palpable bilaterally. Posterior tibial pulses palpable bilaterally. Capillary refill normal to all digits.  No cyanosis or clubbing noted. Pedal hair growth normal.  Neurologic Normal speech. Oriented to person, place, and time. Decrease sensation to light touch noted bilaterally.  Protective sensation slightly diminished.  No vibratory sensation  Dermatologic Nails well groomed and normal in appearance. No open wounds. No skin lesions.  Orthopedic: No open wounds or lesion noted.  Manual muscle strength 5 out of 5.  Xerosis noted to both skin.   Radiographs: None Assessment:   1. Neuropathy    Plan:  Patient was evaluated and treated and all questions answered.  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 Marcelino for further evaluation and management of neuropathy.  I discussed this with patient she states understanding would like to proceed with the referral - Referral was placed.  No follow-ups on file.     [1]  Current Outpatient Medications:    albuterol  (VENTOLIN  HFA) 108 (90 Base) MCG/ACT inhaler, Inhale 2 puffs into the lungs every 6 (six) hours as needed for wheezing or shortness of breath., Disp: 9 g, Rfl: 1   Ascorbic Acid (VITAMIN C PO), Take by mouth daily., Disp: , Rfl:    aspirin  EC 81 MG tablet, Take 81 mg by mouth daily., Disp: , Rfl:    atorvastatin  (LIPITOR) 10 MG tablet, Take 1 tablet (10 mg total) by mouth daily., Disp: 90 tablet, Rfl: 4   Blood Glucose Monitoring Suppl (TRUE METRIX AIR GLUCOSE METER) w/Device KIT, Use to check blood sugar 1-3 times a day., Disp: 1 kit, Rfl: 1   cloNIDine  (CATAPRES ) 0.3 MG tablet, Take 1 tablet (0.3 mg total) by mouth 2 (two) times daily., Disp: 180 tablet, Rfl: 4   Cyanocobalamin  (VITAMIN B 12 PO), Take by mouth daily., Disp: , Rfl:    fluticasone  (FLONASE ) 50 MCG/ACT nasal spray, Place 2 sprays into both nostrils daily., Disp: 16 g, Rfl: 4   furosemide  (LASIX ) 20 MG tablet, Take 1 tablet (20 mg total) by mouth daily., Disp: 90 tablet,  Rfl: 4   glucose blood (TRUE METRIX BLOOD GLUCOSE TEST) test strip, CHECK BLOOD SUGAR 1 TO 2 TIMES DAILY, Disp: 200 strip, Rfl: 3   meloxicam  (MOBIC ) 15 MG tablet, Take 1 tablet (15 mg total) by mouth daily as needed for pain. As needed, Disp: 90 tablet, Rfl: 1   metFORMIN  (GLUCOPHAGE ) 500 MG tablet, TAKE ONE-HALF TABLET BY MOUTH TWICE DAILY, Disp: 180 tablet, Rfl: 4   olmesartan  (BENICAR ) 40 MG tablet, Take 1 tablet (40 mg total) by mouth daily., Disp: 90 tablet, Rfl: 3   pregabalin  (LYRICA ) 100 MG capsule, Take 1 capsule (100 mg total) by  mouth 2 (two) times daily., Disp: 60 capsule, Rfl: 5   Semaglutide , 2 MG/DOSE, 8 MG/3ML SOPN, Inject 2 mg as directed once a week., Disp: 9 mL, Rfl: 3   tiZANidine  (ZANAFLEX ) 4 MG tablet, Take 1 tablet (4 mg total) by mouth every 6 (six) hours as needed for muscle spasms., Disp: 60 tablet, Rfl: 0   TRUEplus Lancets 33G MISC, Use to check blood sugar 1-3 times daily., Disp: 100 each, Rfl: 4   VITAMIN D  PO, Take by mouth daily., Disp: , Rfl:  [2]  Social History Tobacco Use  Smoking Status Never  Smokeless Tobacco Never  [3] No Known Allergies  "

## 2024-05-21 ENCOUNTER — Encounter: Payer: Self-pay | Admitting: Nurse Practitioner

## 2024-05-21 ENCOUNTER — Ambulatory Visit: Admitting: Nurse Practitioner

## 2024-05-21 VITALS — BP 128/86 | HR 79 | Temp 98.2°F | Ht 60.0 in | Wt 252.8 lb

## 2024-05-21 DIAGNOSIS — Z7985 Long-term (current) use of injectable non-insulin antidiabetic drugs: Secondary | ICD-10-CM | POA: Diagnosis not present

## 2024-05-21 DIAGNOSIS — J0110 Acute frontal sinusitis, unspecified: Secondary | ICD-10-CM | POA: Diagnosis not present

## 2024-05-21 DIAGNOSIS — E1140 Type 2 diabetes mellitus with diabetic neuropathy, unspecified: Secondary | ICD-10-CM

## 2024-05-21 DIAGNOSIS — J321 Chronic frontal sinusitis: Secondary | ICD-10-CM | POA: Insufficient documentation

## 2024-05-21 MED ORDER — AMOXICILLIN-POT CLAVULANATE 875-125 MG PO TABS: 1.0000 | ORAL_TABLET | Freq: Two times a day (BID) | ORAL | 0 refills | Status: AC

## 2024-05-21 NOTE — Progress Notes (Signed)
 "  BP 128/86 (BP Location: Left Arm, Patient Position: Sitting, Cuff Size: Large)   Pulse 79   Temp 98.2 F (36.8 C) (Oral)   Ht 5' (1.524 m)   Wt 252 lb 12.8 oz (114.7 kg)   SpO2 96%   BMI 49.37 kg/m    Subjective:    Patient ID: Jillian Pierce, female    DOB: 02-20-1956, 69 y.o.   MRN: [id number]  HPI: Jillian Pierce is a 69 y.o. female  Chief Complaint  Patient presents with   Diabetic Neuropathy    Patient states that her neuropathy is not any better from previous visit   NEUROPATHY Follow-up today for neuropathy, increased Lyrica  to 100 MG BID. Takes Tylenol  once a day at home. Saw podiatry yesterday for initial visit to check on feet and wound to left ankle. They placed a referral to Dr. Marcelino at pain clinic to look into wraps for neuropathy.  Neuropathy status: uncontrolled  Satisfied with current treatment?: yes Medication side effects: makes a little tired Medication compliance:  good compliance Location: both foot -- L>R Pain: yes Severity: 10/10 left and 7/10 right Quality:  dull, aching, burning, tingling, and pins and needles Frequency: constant Bilateral: yes Symmetric: yes Numbness: yes Decreased sensation: yes Weakness: no Context: fluctuating Alleviating factors: nothing Aggravating factors: walking Treatments attempted: Gabapentin , Lyrica , Tylenol   UPPER RESPIRATORY TRACT INFECTION For 1 1/2 weeks with no improvement. Fever: no Cough: yes, mild Shortness of breath: yes, at baseline, no worsening Wheezing: no Chest pain: no Chest tightness: no Chest congestion: no Nasal congestion: yes Runny nose: yes Post nasal drip: yes Sneezing: no Sore throat: no Swollen glands: no Sinus pressure: yes Headache: yes Face pain: yes Toothache: no Ear pain: none Ear pressure: none Eyes red/itching:no Eye drainage/crusting: no  Vomiting: no Rash: no Fatigue: yes Sick contacts: no Strep contacts: no  Context: fluctuating Recurrent sinusitis:  no Relief with OTC cold/cough medications: yes  Treatments attempted: mucinex and cough syrup    Relevant past medical, surgical, family and social history reviewed and updated as indicated. Interim medical history since our last visit reviewed. Allergies and medications reviewed and updated.  Review of Systems  Constitutional:  Negative for activity change, appetite change, diaphoresis, fatigue and fever.  Respiratory:  Positive for shortness of breath (at baseline, no worsening). Negative for cough, chest tightness and wheezing.   Cardiovascular:  Positive for leg swelling (at baseline). Negative for chest pain and palpitations.  Gastrointestinal: Negative.   Endocrine: Negative for cold intolerance, heat intolerance, polydipsia, polyphagia and polyuria.  Neurological:  Positive for numbness. Negative for dizziness, seizures, facial asymmetry, weakness and headaches.  Psychiatric/Behavioral: Negative.      Per HPI unless specifically indicated above     Objective:    BP 128/86 (BP Location: Left Arm, Patient Position: Sitting, Cuff Size: Large)   Pulse 79   Temp 98.2 F (36.8 C) (Oral)   Ht 5' (1.524 m)   Wt 252 lb 12.8 oz (114.7 kg)   SpO2 96%   BMI 49.37 kg/m   Wt Readings from Last 3 Encounters:  05/21/24 252 lb 12.8 oz (114.7 kg)  04/21/24 246 lb (111.6 kg)  01/20/24 261 lb 6.4 oz (118.6 kg)    Physical Exam Vitals and nursing note reviewed.  Constitutional:      General: She is awake. She is not in acute distress.    Appearance: She is well-developed and well-groomed. She is obese. She is not ill-appearing or toxic-appearing.  HENT:  Head: Normocephalic.     Right Ear: Hearing, ear canal and external ear normal. A middle ear effusion is present. There is no impacted cerumen. Tympanic membrane is not injected.     Left Ear: Hearing, ear canal and external ear normal. A middle ear effusion is present. There is no impacted cerumen. Tympanic membrane is not injected.      Nose: Rhinorrhea present. Rhinorrhea is clear.     Right Sinus: Frontal sinus tenderness present. No maxillary sinus tenderness.     Left Sinus: Frontal sinus tenderness present. No maxillary sinus tenderness.     Mouth/Throat:     Mouth: Mucous membranes are moist.     Pharynx: Posterior oropharyngeal erythema (mild) and postnasal drip present. No pharyngeal swelling or oropharyngeal exudate.  Eyes:     General: Lids are normal.        Right eye: No discharge.        Left eye: No discharge.     Conjunctiva/sclera: Conjunctivae normal.     Pupils: Pupils are equal, round, and reactive to light.  Neck:     Thyroid : No thyromegaly.     Vascular: No carotid bruit.  Cardiovascular:     Rate and Rhythm: Normal rate and regular rhythm.     Pulses:          Dorsalis pedis pulses are 2+ on the right side and 2+ on the left side.       Posterior tibial pulses are 2+ on the right side and 2+ on the left side.     Heart sounds: Murmur heard.     Systolic murmur is present with a grade of 2/6.     No gallop.  Pulmonary:     Effort: Pulmonary effort is normal. No accessory muscle usage or respiratory distress.     Breath sounds: Normal breath sounds. No decreased breath sounds, wheezing or rales.  Abdominal:     General: Bowel sounds are normal. There is no distension.     Palpations: Abdomen is soft.     Tenderness: There is no abdominal tenderness.  Musculoskeletal:     Cervical back: Normal range of motion and neck supple.     Right lower leg: 1+ Edema present.     Left lower leg: 1+ Edema present.  Feet:     Right foot:     Skin integrity: Dry skin present.     Toenail Condition: Right toenails are abnormally thick.     Left foot:     Skin integrity: Dry skin present.     Toenail Condition: Left toenails are abnormally thick.     Comments: To left posterior ankle, at site of scar tissue from past surgery, there is a small, round, approx 3 cm wound that is fully crusted  over. Lymphadenopathy:     Cervical: No cervical adenopathy.  Skin:    General: Skin is warm and dry.  Neurological:     Mental Status: She is alert and oriented to person, place, and time.     Cranial Nerves: Cranial nerves 2-12 are intact.     Deep Tendon Reflexes: Reflexes are normal and symmetric.     Reflex Scores:      Brachioradialis reflexes are 2+ on the right side and 2+ on the left side.      Patellar reflexes are 2+ on the right side and 2+ on the left side. Psychiatric:        Attention and Perception: Attention normal.  Mood and Affect: Mood normal.        Speech: Speech normal.        Behavior: Behavior normal. Behavior is cooperative.        Thought Content: Thought content normal.     Results for orders placed or performed in visit on 04/21/24  Bayer DCA Hb A1c Waived   Collection Time: 04/21/24 10:47 AM  Result Value Ref Range   HB A1C (BAYER DCA - WAIVED) 6.2 (H) 4.8 - 5.6 %  CBC with Differential/Platelet   Collection Time: 04/21/24 10:48 AM  Result Value Ref Range   WBC 5.3 3.4 - 10.8 x10E3/uL   RBC 4.28 3.77 - 5.28 x10E6/uL   Hemoglobin 11.6 11.1 - 15.9 g/dL   Hematocrit 62.9 65.9 - 46.6 %   MCV 86 79 - 97 fL   MCH 27.1 26.6 - 33.0 pg   MCHC 31.4 (L) 31.5 - 35.7 g/dL   RDW 85.5 88.2 - 84.5 %   Platelets 433 150 - 450 x10E3/uL   Neutrophils 58 Not Estab. %   Lymphs 28 Not Estab. %   Monocytes 9 Not Estab. %   Eos 4 Not Estab. %   Basos 1 Not Estab. %   Neutrophils Absolute 3.1 1.4 - 7.0 x10E3/uL   Lymphocytes Absolute 1.5 0.7 - 3.1 x10E3/uL   Monocytes Absolute 0.5 0.1 - 0.9 x10E3/uL   EOS (ABSOLUTE) 0.2 0.0 - 0.4 x10E3/uL   Basophils Absolute 0.0 0.0 - 0.2 x10E3/uL   Immature Granulocytes 0 Not Estab. %   Immature Grans (Abs) 0.0 0.0 - 0.1 x10E3/uL  Comprehensive metabolic panel with GFR   Collection Time: 04/21/24 10:48 AM  Result Value Ref Range   Glucose 104 (H) 70 - 99 mg/dL   BUN 13 8 - 27 mg/dL   Creatinine, Ser 9.14 0.57 - 1.00  mg/dL   eGFR 74 >40 fO/fpw/8.26   BUN/Creatinine Ratio 15 12 - 28   Sodium 142 134 - 144 mmol/L   Potassium 3.9 3.5 - 5.2 mmol/L   Chloride 105 96 - 106 mmol/L   CO2 21 20 - 29 mmol/L   Calcium  9.8 8.7 - 10.3 mg/dL   Total Protein 7.1 6.0 - 8.5 g/dL   Albumin 4.3 3.9 - 4.9 g/dL   Globulin, Total 2.8 1.5 - 4.5 g/dL   Bilirubin Total 0.6 0.0 - 1.2 mg/dL   Alkaline Phosphatase 82 49 - 135 IU/L   AST 16 0 - 40 IU/L   ALT 12 0 - 32 IU/L  Lipid Panel w/o Chol/HDL Ratio   Collection Time: 04/21/24 10:48 AM  Result Value Ref Range   Cholesterol, Total 115 100 - 199 mg/dL   Triglycerides 54 0 - 149 mg/dL   HDL 51 >60 mg/dL   VLDL Cholesterol Cal 12 5 - 40 mg/dL   LDL Chol Calc (NIH) 52 0 - 99 mg/dL  TSH   Collection Time: 04/21/24 10:48 AM  Result Value Ref Range   TSH 1.080 0.450 - 4.500 uIU/mL  VITAMIN D  25 Hydroxy (Vit-D Deficiency, Fractures)   Collection Time: 04/21/24 10:48 AM  Result Value Ref Range   Vit D, 25-Hydroxy 35.2 30.0 - 100.0 ng/mL  Vitamin B12   Collection Time: 04/21/24 10:48 AM  Result Value Ref Range   Vitamin B-12 1,811 (H) 232 - 1,245 pg/mL      Assessment & Plan:   Problem List Items Addressed This Visit       Respiratory   Frontal sinusitis  Acute for 1 1/2 weeks, will start Augmentin  BID for 7 days. Discussed plan of care with her. Recommend: - Increased rest - Increasing Fluids - Acetaminophen  / ibuprofen  as needed for fever/pain.  - Salt water gargling, chloraseptic spray and throat lozenges - Mucinex.  - Saline sinus flushes or a neti pot.  - Humidifying the air.       Relevant Medications   amoxicillin -clavulanate (AUGMENTIN ) 875-125 MG tablet     Endocrine   Diabetic neuropathy, painful (HCC) - Primary   Chronic, ongoing with A1c 6.2% today, remaining stable  Urine ALB 80 February 2025, taking ARB.  Will continue Ozempic  2 MG weekly. Continue Metformin  once a day. Mounjaro may offer more weight loss benefit, which would help back pain  - could change to this in future. Continue daily B12 supplement for low levels in past. Will continue Lyrica  100 MG BID, will not increase further due to concern for worsening fatigue. Continue Tylenol  PRN.  Recommend she apply Voltaren  gel and Magnesium cream to legs as needed. 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 Scheduled to see neurology in April 2026 and podiatry placed a referral to pain clinic. - Eye and foot exams up to date - Vaccinations up to date - ARB and statin on board        Follow up plan: Return in about 2 months (around 07/21/2024) for T2DM, HTN/HLD.      "

## 2024-05-21 NOTE — Assessment & Plan Note (Signed)
 Chronic, ongoing with A1c 6.2% today, remaining stable  Urine ALB 80 February 2025, taking ARB.  Will continue Ozempic  2 MG weekly. Continue Metformin  once a day. Mounjaro may offer more weight loss benefit, which would help back pain - could change to this in future. Continue daily B12 supplement for low levels in past. Will continue Lyrica  100 MG BID, will not increase further due to concern for worsening fatigue. Continue Tylenol  PRN.  Recommend she apply Voltaren  gel and Magnesium cream to legs as needed. 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 Scheduled to see neurology in April 2026 and podiatry placed a referral to pain clinic. - Eye and foot exams up to date - Vaccinations up to date - ARB and statin on board

## 2024-05-21 NOTE — Assessment & Plan Note (Signed)
 Acute for 1 1/2 weeks, will start Augmentin  BID for 7 days. Discussed plan of care with her. Recommend: - Increased rest - Increasing Fluids - Acetaminophen  / ibuprofen  as needed for fever/pain.  - Salt water gargling, chloraseptic spray and throat lozenges - Mucinex.  - Saline sinus flushes or a neti pot.  - Humidifying the air.

## 2024-07-06 ENCOUNTER — Ambulatory Visit

## 2024-08-03 ENCOUNTER — Ambulatory Visit: Admitting: Nurse Practitioner
